# Patient Record
Sex: Female | Born: 1935 | Race: White | Hispanic: No | Smoking: Former smoker
Health system: Southern US, Community
[De-identification: ages and names within clinical notes are randomized; demographics above are authoritative.]

## PROBLEM LIST (undated history)

## (undated) DIAGNOSIS — IMO0002 Reserved for concepts with insufficient information to code with codable children: Secondary | ICD-10-CM

## (undated) DIAGNOSIS — L89309 Pressure ulcer of unspecified buttock, unspecified stage: Secondary | ICD-10-CM

## (undated) DIAGNOSIS — M839 Adult osteomalacia, unspecified: Secondary | ICD-10-CM

## (undated) DIAGNOSIS — J9620 Acute and chronic respiratory failure, unspecified whether with hypoxia or hypercapnia: Secondary | ICD-10-CM

## (undated) DIAGNOSIS — J90 Pleural effusion, not elsewhere classified: Secondary | ICD-10-CM

## (undated) DIAGNOSIS — I2510 Atherosclerotic heart disease of native coronary artery without angina pectoris: Secondary | ICD-10-CM

## (undated) DIAGNOSIS — R791 Abnormal coagulation profile: Secondary | ICD-10-CM

## (undated) DIAGNOSIS — R1312 Dysphagia, oropharyngeal phase: Secondary | ICD-10-CM

## (undated) DIAGNOSIS — J189 Pneumonia, unspecified organism: Secondary | ICD-10-CM

## (undated) DIAGNOSIS — E46 Unspecified protein-calorie malnutrition: Secondary | ICD-10-CM

## (undated) DIAGNOSIS — I96 Gangrene, not elsewhere classified: Secondary | ICD-10-CM

## (undated) DIAGNOSIS — I10 Essential (primary) hypertension: Secondary | ICD-10-CM

## (undated) DIAGNOSIS — M1990 Unspecified osteoarthritis, unspecified site: Secondary | ICD-10-CM

## (undated) DIAGNOSIS — I739 Peripheral vascular disease, unspecified: Secondary | ICD-10-CM

## (undated) DIAGNOSIS — E785 Hyperlipidemia, unspecified: Secondary | ICD-10-CM

## (undated) DIAGNOSIS — R040 Epistaxis: Secondary | ICD-10-CM

## (undated) DIAGNOSIS — Z8719 Personal history of other diseases of the digestive system: Secondary | ICD-10-CM

## (undated) DIAGNOSIS — M479 Spondylosis, unspecified: Secondary | ICD-10-CM

## (undated) DIAGNOSIS — J449 Chronic obstructive pulmonary disease, unspecified: Secondary | ICD-10-CM

## (undated) DIAGNOSIS — E876 Hypokalemia: Secondary | ICD-10-CM

## (undated) DIAGNOSIS — N390 Urinary tract infection, site not specified: Secondary | ICD-10-CM

## (undated) DIAGNOSIS — I70209 Unspecified atherosclerosis of native arteries of extremities, unspecified extremity: Secondary | ICD-10-CM

## (undated) DIAGNOSIS — K219 Gastro-esophageal reflux disease without esophagitis: Secondary | ICD-10-CM

## (undated) DIAGNOSIS — I4891 Unspecified atrial fibrillation: Secondary | ICD-10-CM

## (undated) DIAGNOSIS — Z950 Presence of cardiac pacemaker: Secondary | ICD-10-CM

## (undated) DIAGNOSIS — Z1612 Extended spectrum beta lactamase (ESBL) resistance: Secondary | ICD-10-CM

## (undated) DIAGNOSIS — I509 Heart failure, unspecified: Secondary | ICD-10-CM

## (undated) DIAGNOSIS — A499 Bacterial infection, unspecified: Secondary | ICD-10-CM

## (undated) HISTORY — DX: Adult osteomalacia, unspecified: M83.9

## (undated) HISTORY — DX: Peripheral vascular disease, unspecified: I73.9

## (undated) HISTORY — DX: Hypokalemia: E87.6

## (undated) HISTORY — DX: Essential (primary) hypertension: I10

## (undated) HISTORY — DX: Unspecified protein-calorie malnutrition: E46

## (undated) HISTORY — DX: Abnormal coagulation profile: R79.1

## (undated) HISTORY — PX: CATARACT EXTRACTION, BILATERAL: SHX1313

## (undated) HISTORY — DX: Urinary tract infection, site not specified: N39.0

## (undated) HISTORY — PX: CHOLECYSTECTOMY: SHX55

## (undated) HISTORY — DX: Acute and chronic respiratory failure, unspecified whether with hypoxia or hypercapnia: J96.20

## (undated) HISTORY — DX: Pleural effusion, not elsewhere classified: J90

## (undated) HISTORY — PX: ABDOMINAL HYSTERECTOMY: SHX81

## (undated) HISTORY — PX: APPENDECTOMY: SHX54

## (undated) HISTORY — DX: Pressure ulcer of unspecified buttock, unspecified stage: L89.309

## (undated) HISTORY — DX: Presence of cardiac pacemaker: Z95.0

## (undated) HISTORY — DX: Dysphagia, oropharyngeal phase: R13.12

---
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
## 2001-11-24 ENCOUNTER — Encounter: Payer: Self-pay | Admitting: Internal Medicine

## 2001-11-24 ENCOUNTER — Encounter: Admission: RE | Admit: 2001-11-24 | Discharge: 2001-11-24 | Payer: Self-pay | Admitting: Internal Medicine

## 2002-04-14 ENCOUNTER — Encounter: Admission: RE | Admit: 2002-04-14 | Discharge: 2002-04-14 | Payer: Self-pay | Admitting: Orthopedic Surgery

## 2002-04-14 ENCOUNTER — Encounter: Payer: Self-pay | Admitting: Orthopedic Surgery

## 2002-06-22 ENCOUNTER — Encounter: Payer: Self-pay | Admitting: Internal Medicine

## 2002-06-22 ENCOUNTER — Encounter: Admission: RE | Admit: 2002-06-22 | Discharge: 2002-06-22 | Payer: Self-pay | Admitting: Internal Medicine

## 2002-12-14 ENCOUNTER — Encounter: Admission: RE | Admit: 2002-12-14 | Discharge: 2002-12-14 | Payer: Self-pay | Admitting: Internal Medicine

## 2002-12-14 ENCOUNTER — Encounter: Payer: Self-pay | Admitting: Internal Medicine

## 2004-02-23 ENCOUNTER — Encounter: Admission: RE | Admit: 2004-02-23 | Discharge: 2004-02-23 | Payer: Self-pay | Admitting: Internal Medicine

## 2004-08-21 ENCOUNTER — Ambulatory Visit (HOSPITAL_COMMUNITY): Admission: RE | Admit: 2004-08-21 | Discharge: 2004-08-21 | Payer: Self-pay | Admitting: Cardiology

## 2004-12-04 ENCOUNTER — Emergency Department (HOSPITAL_COMMUNITY): Admission: EM | Admit: 2004-12-04 | Discharge: 2004-12-04 | Payer: Self-pay | Admitting: Emergency Medicine

## 2005-03-21 ENCOUNTER — Encounter: Admission: RE | Admit: 2005-03-21 | Discharge: 2005-03-21 | Payer: Self-pay | Admitting: Internal Medicine

## 2005-10-03 ENCOUNTER — Encounter: Admission: RE | Admit: 2005-10-03 | Discharge: 2005-10-03 | Payer: Self-pay | Admitting: Orthopedic Surgery

## 2005-12-01 ENCOUNTER — Encounter: Admission: RE | Admit: 2005-12-01 | Discharge: 2005-12-01 | Payer: Self-pay | Admitting: Internal Medicine

## 2006-03-23 ENCOUNTER — Encounter: Admission: RE | Admit: 2006-03-23 | Discharge: 2006-03-23 | Payer: Self-pay | Admitting: Internal Medicine

## 2007-02-27 ENCOUNTER — Encounter: Payer: Self-pay | Admitting: Internal Medicine

## 2007-03-02 DIAGNOSIS — R05 Cough: Secondary | ICD-10-CM

## 2007-03-03 ENCOUNTER — Telehealth: Payer: Self-pay | Admitting: Internal Medicine

## 2007-03-03 ENCOUNTER — Telehealth (INDEPENDENT_AMBULATORY_CARE_PROVIDER_SITE_OTHER): Payer: Self-pay | Admitting: *Deleted

## 2007-03-03 ENCOUNTER — Ambulatory Visit: Payer: Self-pay | Admitting: Internal Medicine

## 2007-03-03 DIAGNOSIS — J449 Chronic obstructive pulmonary disease, unspecified: Secondary | ICD-10-CM | POA: Insufficient documentation

## 2007-03-03 DIAGNOSIS — I10 Essential (primary) hypertension: Secondary | ICD-10-CM | POA: Insufficient documentation

## 2007-03-03 DIAGNOSIS — I4891 Unspecified atrial fibrillation: Secondary | ICD-10-CM

## 2007-03-05 ENCOUNTER — Telehealth: Payer: Self-pay | Admitting: Internal Medicine

## 2007-03-24 ENCOUNTER — Telehealth: Payer: Self-pay | Admitting: Internal Medicine

## 2007-03-29 ENCOUNTER — Encounter: Payer: Self-pay | Admitting: Internal Medicine

## 2007-04-07 ENCOUNTER — Encounter: Admission: RE | Admit: 2007-04-07 | Discharge: 2007-04-07 | Payer: Self-pay | Admitting: Family Medicine

## 2007-04-14 ENCOUNTER — Telehealth: Payer: Self-pay | Admitting: Internal Medicine

## 2007-07-29 ENCOUNTER — Ambulatory Visit: Payer: Self-pay | Admitting: *Deleted

## 2007-07-29 ENCOUNTER — Ambulatory Visit: Payer: Self-pay | Admitting: Internal Medicine

## 2007-07-29 ENCOUNTER — Inpatient Hospital Stay (HOSPITAL_COMMUNITY): Admission: EM | Admit: 2007-07-29 | Discharge: 2007-08-09 | Payer: Self-pay | Admitting: Emergency Medicine

## 2007-07-30 ENCOUNTER — Encounter: Payer: Self-pay | Admitting: Internal Medicine

## 2007-07-30 ENCOUNTER — Encounter (INDEPENDENT_AMBULATORY_CARE_PROVIDER_SITE_OTHER): Payer: Self-pay | Admitting: Internal Medicine

## 2007-08-05 ENCOUNTER — Encounter: Payer: Self-pay | Admitting: Pulmonary Disease

## 2007-08-05 HISTORY — PX: PERMANENT PACEMAKER INSERTION: SHX6023

## 2007-08-06 ENCOUNTER — Ambulatory Visit: Payer: Self-pay | Admitting: Physical Medicine & Rehabilitation

## 2007-08-09 ENCOUNTER — Inpatient Hospital Stay (HOSPITAL_COMMUNITY)
Admission: RE | Admit: 2007-08-09 | Discharge: 2007-08-19 | Payer: Self-pay | Admitting: Physical Medicine & Rehabilitation

## 2007-08-19 ENCOUNTER — Telehealth (INDEPENDENT_AMBULATORY_CARE_PROVIDER_SITE_OTHER): Payer: Self-pay | Admitting: *Deleted

## 2007-08-24 ENCOUNTER — Ambulatory Visit: Payer: Self-pay | Admitting: Internal Medicine

## 2007-08-24 ENCOUNTER — Telehealth: Payer: Self-pay | Admitting: Internal Medicine

## 2007-09-13 ENCOUNTER — Telehealth (INDEPENDENT_AMBULATORY_CARE_PROVIDER_SITE_OTHER): Payer: Self-pay | Admitting: *Deleted

## 2007-11-10 ENCOUNTER — Ambulatory Visit (HOSPITAL_COMMUNITY)
Admission: RE | Admit: 2007-11-10 | Discharge: 2007-11-10 | Payer: Self-pay | Admitting: Physical Medicine & Rehabilitation

## 2007-12-20 ENCOUNTER — Telehealth (INDEPENDENT_AMBULATORY_CARE_PROVIDER_SITE_OTHER): Payer: Self-pay | Admitting: *Deleted

## 2008-03-15 ENCOUNTER — Ambulatory Visit: Payer: Self-pay | Admitting: Vascular Surgery

## 2008-03-26 ENCOUNTER — Encounter: Payer: Self-pay | Admitting: Internal Medicine

## 2008-04-27 ENCOUNTER — Ambulatory Visit: Payer: Self-pay | Admitting: Internal Medicine

## 2008-04-28 LAB — CONVERTED CEMR LAB: INR: 2 — ABNORMAL HIGH (ref 0.8–1.0)

## 2008-05-12 ENCOUNTER — Telehealth: Payer: Self-pay | Admitting: Internal Medicine

## 2008-05-29 ENCOUNTER — Inpatient Hospital Stay (HOSPITAL_COMMUNITY): Admission: EM | Admit: 2008-05-29 | Discharge: 2008-06-02 | Payer: Self-pay | Admitting: Emergency Medicine

## 2008-06-01 ENCOUNTER — Encounter (INDEPENDENT_AMBULATORY_CARE_PROVIDER_SITE_OTHER): Payer: Self-pay | Admitting: *Deleted

## 2008-06-05 ENCOUNTER — Telehealth (INDEPENDENT_AMBULATORY_CARE_PROVIDER_SITE_OTHER): Payer: Self-pay | Admitting: *Deleted

## 2008-06-11 ENCOUNTER — Ambulatory Visit: Payer: Self-pay | Admitting: Cardiology

## 2008-06-12 ENCOUNTER — Inpatient Hospital Stay (HOSPITAL_COMMUNITY): Admission: EM | Admit: 2008-06-12 | Discharge: 2008-06-17 | Payer: Self-pay | Admitting: Emergency Medicine

## 2008-06-23 ENCOUNTER — Emergency Department (HOSPITAL_COMMUNITY): Admission: EM | Admit: 2008-06-23 | Discharge: 2008-06-23 | Payer: Self-pay | Admitting: Emergency Medicine

## 2008-06-24 ENCOUNTER — Inpatient Hospital Stay (HOSPITAL_COMMUNITY): Admission: EM | Admit: 2008-06-24 | Discharge: 2008-06-28 | Payer: Self-pay | Admitting: Emergency Medicine

## 2008-06-24 ENCOUNTER — Ambulatory Visit: Payer: Self-pay | Admitting: Internal Medicine

## 2008-06-27 ENCOUNTER — Telehealth: Payer: Self-pay | Admitting: Internal Medicine

## 2008-09-01 ENCOUNTER — Encounter: Admission: RE | Admit: 2008-09-01 | Discharge: 2008-09-01 | Payer: Self-pay | Admitting: Family Medicine

## 2008-11-01 ENCOUNTER — Encounter: Payer: Self-pay | Admitting: Internal Medicine

## 2009-02-06 LAB — PROTIME-INR

## 2009-08-23 ENCOUNTER — Telehealth (INDEPENDENT_AMBULATORY_CARE_PROVIDER_SITE_OTHER): Payer: Self-pay | Admitting: *Deleted

## 2009-09-20 ENCOUNTER — Encounter: Admission: RE | Admit: 2009-09-20 | Discharge: 2009-09-20 | Payer: Self-pay | Admitting: Family Medicine

## 2010-03-17 ENCOUNTER — Encounter: Payer: Self-pay | Admitting: Family Medicine

## 2010-03-26 NOTE — Progress Notes (Signed)
Summary: Spiriva RX  Phone Note Call from Patient Call back at [phone] ex [phone]   Caller: Patient Call For: wert Summary of Call: need 90 day supply prescript for spiriva sent to [facility] Initial call taken by: Rickard Patience,  August 23, 2009 1:52 PM  Follow-up for Phone Call        Dr. Sherene Sires, you wanted the pt to followup here only as needed, is it okay to send a refill to [facility] for at least a 90 day supply? Pls advise thanks! Follow-up by: Vernie Murders,  August 23, 2009 2:08 PM  Additional Follow-up for Phone Call Additional follow up Details #1::        not seen > 1 year so option is to return for check up (Tammy could do this) or have primary complete the rx (either way make sure she doesn't run out of rx while waiting for her shipment!) Additional Follow-up by: Nyoka Cowden MD,  August 23, 2009 2:52 PM    Additional Follow-up for Phone Call Additional follow up Details #2::    Spoke with the [t's daughter, Steward Drone, and she is going to see if pt's cardiologist will fill this medication since she has an upcoming appt. She will call if needing an appt with MW or TP. Per Steward Drone, the pt just started on her last 90 day supply of Spiriva and has plenty for now. Follow-up by: Michel Bickers CMA,  August 23, 2009 3:15 PM

## 2010-03-29 LAB — PROTIME-INR

## 2010-06-04 LAB — CBC
HCT: 28.3 % — ABNORMAL LOW (ref 36.0–46.0)
HCT: 28.7 % — ABNORMAL LOW (ref 36.0–46.0)
Hemoglobin: 9.5 g/dL — ABNORMAL LOW (ref 12.0–15.0)
MCHC: 33.3 g/dL (ref 30.0–36.0)
MCHC: 33.4 g/dL (ref 30.0–36.0)
MCV: 92.5 fL (ref 78.0–100.0)
Platelets: 197 10*3/uL (ref 150–400)
Platelets: 208 10*3/uL (ref 150–400)
Platelets: 211 10*3/uL (ref 150–400)
RBC: 2.98 MIL/uL — ABNORMAL LOW (ref 3.87–5.11)
RBC: 3.1 MIL/uL — ABNORMAL LOW (ref 3.87–5.11)
RDW: 14.3 % (ref 11.5–15.5)
RDW: 14.7 % (ref 11.5–15.5)
RDW: 14.9 % (ref 11.5–15.5)
WBC: 5.3 10*3/uL (ref 4.0–10.5)
WBC: 6.1 10*3/uL (ref 4.0–10.5)
WBC: 6.2 10*3/uL (ref 4.0–10.5)

## 2010-06-04 LAB — COMPREHENSIVE METABOLIC PANEL
ALT: 13 U/L (ref 0–35)
AST: 18 U/L (ref 0–37)
CO2: 28 mEq/L (ref 19–32)
Chloride: 105 mEq/L (ref 96–112)
GFR calc Af Amer: >60 mL/min (ref >60)
GFR calc non Af Amer: >60 mL/min (ref >60)
Sodium: 141 mEq/L (ref 135–145)
Total Bilirubin: 0.3 mg/dL (ref 0.3–1.2)

## 2010-06-04 LAB — BASIC METABOLIC PANEL
BUN: 2 mg/dL — ABNORMAL LOW (ref 6–23)
BUN: 3 mg/dL — ABNORMAL LOW (ref 6–23)
BUN: 4 mg/dL — ABNORMAL LOW (ref 6–23)
CO2: 27 mEq/L (ref 19–32)
CO2: 28 mEq/L (ref 19–32)
CO2: 29 mEq/L (ref 19–32)
Calcium: 8.4 mg/dL (ref 8.4–10.5)
Calcium: 8.7 mg/dL (ref 8.4–10.5)
Chloride: 108 mEq/L (ref 96–112)
Creatinine, Ser: 0.6 mg/dL (ref 0.4–1.2)
Creatinine, Ser: 0.77 mg/dL (ref 0.4–1.2)
GFR calc Af Amer: >60 mL/min (ref >60)
GFR calc Af Amer: >60 mL/min (ref >60)
GFR calc non Af Amer: >60 mL/min (ref >60)
GFR calc non Af Amer: >60 mL/min (ref >60)
Glucose, Bld: 111 mg/dL — ABNORMAL HIGH (ref 70–99)
Glucose, Bld: 112 mg/dL — ABNORMAL HIGH (ref 70–99)
Glucose, Bld: 115 mg/dL — ABNORMAL HIGH (ref 70–99)
Potassium: 4 mEq/L (ref 3.5–5.1)
Potassium: 4.4 mEq/L (ref 3.5–5.1)
Sodium: 141 mEq/L (ref 135–145)

## 2010-06-04 LAB — PROTIME-INR
INR: 1.8 — ABNORMAL HIGH (ref 0.00–1.49)
INR: 2.1 — ABNORMAL HIGH (ref 0.00–1.49)
Prothrombin Time: 21.8 seconds — ABNORMAL HIGH (ref 11.6–15.2)
Prothrombin Time: 23.9 seconds — ABNORMAL HIGH (ref 11.6–15.2)
Prothrombin Time: 25.3 seconds — ABNORMAL HIGH (ref 11.6–15.2)

## 2010-06-04 LAB — URINE CULTURE: Colony Count: 60000

## 2010-06-04 LAB — URINALYSIS, ROUTINE W REFLEX MICROSCOPIC
Bilirubin Urine: NEGATIVE
Nitrite: NEGATIVE
Specific Gravity, Urine: 1.019 (ref 1.005–1.030)
Urobilinogen, UA: 0.2 mg/dL (ref 0.0–1.0)

## 2010-06-04 LAB — CULTURE, BLOOD (ROUTINE X 2): Culture: NO GROWTH

## 2010-06-04 LAB — DIFFERENTIAL
Basophils Absolute: 0 10*3/uL (ref 0.0–0.1)
Eosinophils Absolute: 0.1 10*3/uL (ref 0.0–0.7)
Eosinophils Relative: 1 % (ref 0–5)
Lymphs Abs: 1.3 10*3/uL (ref 0.7–4.0)

## 2010-06-05 LAB — CBC
HCT: 32.6 % — ABNORMAL LOW (ref 36.0–46.0)
HCT: 34.2 % — ABNORMAL LOW (ref 36.0–46.0)
HCT: 34.3 % — ABNORMAL LOW (ref 36.0–46.0)
Hemoglobin: 9.8 g/dL — ABNORMAL LOW (ref 12.0–15.0)
Hemoglobin: 11.3 g/dL — ABNORMAL LOW (ref 12.0–15.0)
Hemoglobin: 11.6 g/dL — ABNORMAL LOW (ref 12.0–15.0)
MCHC: 34 g/dL (ref 30.0–36.0)
MCHC: 34.1 g/dL (ref 30.0–36.0)
MCHC: 34.5 g/dL (ref 30.0–36.0)
MCHC: 34.7 g/dL (ref 30.0–36.0)
MCHC: 33.7 g/dL (ref 30.0–36.0)
MCHC: 33.6 g/dL (ref 30.0–36.0)
MCHC: 33.6 g/dL (ref 30.0–36.0)
MCHC: 33.9 g/dL (ref 30.0–36.0)
MCV: 91.5 fL (ref 78.0–100.0)
MCV: 91.9 fL (ref 78.0–100.0)
MCV: 90.8 fL (ref 78.0–100.0)
MCV: 91.4 fL (ref 78.0–100.0)
MCV: 91.5 fL (ref 78.0–100.0)
Platelets: 172 10*3/uL (ref 150–400)
Platelets: 184 10*3/uL (ref 150–400)
Platelets: 194 10*3/uL (ref 150–400)
Platelets: 218 10*3/uL (ref 150–400)
Platelets: 197 10*3/uL (ref 150–400)
Platelets: 210 10*3/uL (ref 150–400)
Platelets: 211 10*3/uL (ref 150–400)
Platelets: 232 10*3/uL (ref 150–400)
RBC: 3.18 MIL/uL — ABNORMAL LOW (ref 3.87–5.11)
RBC: 3.21 MIL/uL — ABNORMAL LOW (ref 3.87–5.11)
RBC: 3.62 MIL/uL — ABNORMAL LOW (ref 3.87–5.11)
RBC: 3.98 MIL/uL (ref 3.87–5.11)
RBC: 3.57 MIL/uL — ABNORMAL LOW (ref 3.87–5.11)
RDW: 14.8 % (ref 11.5–15.5)
RDW: 15.1 % (ref 11.5–15.5)
RDW: 14.2 % (ref 11.5–15.5)
RDW: 13.9 % (ref 11.5–15.5)
RDW: 14.1 % (ref 11.5–15.5)
WBC: 14 10*3/uL — ABNORMAL HIGH (ref 4.0–10.5)
WBC: 6.4 10*3/uL (ref 4.0–10.5)
WBC: 6.6 10*3/uL (ref 4.0–10.5)
WBC: 7.2 10*3/uL (ref 4.0–10.5)
WBC: 10.4 10*3/uL (ref 4.0–10.5)
WBC: 11.2 10*3/uL — ABNORMAL HIGH (ref 4.0–10.5)
WBC: 17.3 10*3/uL — ABNORMAL HIGH (ref 4.0–10.5)

## 2010-06-05 LAB — URINALYSIS, ROUTINE W REFLEX MICROSCOPIC
Bilirubin Urine: NEGATIVE
Bilirubin Urine: NEGATIVE
Bilirubin Urine: NEGATIVE
Glucose, UA: NEGATIVE mg/dL
Hgb urine dipstick: NEGATIVE
Hgb urine dipstick: NEGATIVE
Ketones, ur: NEGATIVE mg/dL
Nitrite: NEGATIVE
Nitrite: POSITIVE — AB
Protein, ur: NEGATIVE mg/dL
Protein, ur: NEGATIVE mg/dL
Specific Gravity, Urine: 1.016 (ref 1.005–1.030)
Urobilinogen, UA: 0.2 mg/dL (ref 0.0–1.0)
Urobilinogen, UA: 0.2 mg/dL (ref 0.0–1.0)
pH: 7.5 (ref 5.0–8.0)

## 2010-06-05 LAB — COMPREHENSIVE METABOLIC PANEL
ALT: 17 U/L (ref 0–35)
ALT: 21 U/L (ref 0–35)
AST: 19 U/L (ref 0–37)
AST: 27 U/L (ref 0–37)
Albumin: 2.9 g/dL — ABNORMAL LOW (ref 3.5–5.2)
Albumin: 3.4 g/dL — ABNORMAL LOW (ref 3.5–5.2)
Alkaline Phosphatase: 43 U/L (ref 39–117)
Alkaline Phosphatase: 44 U/L (ref 39–117)
BUN: 11 mg/dL (ref 6–23)
BUN: 11 mg/dL (ref 6–23)
CO2: 29 mEq/L (ref 19–32)
CO2: 30 mEq/L (ref 19–32)
CO2: 31 mEq/L (ref 19–32)
Calcium: 8.2 mg/dL — ABNORMAL LOW (ref 8.4–10.5)
Calcium: 9.1 mg/dL (ref 8.4–10.5)
Chloride: 94 mEq/L — ABNORMAL LOW (ref 96–112)
Creatinine, Ser: 0.86 mg/dL (ref 0.4–1.2)
Creatinine, Ser: 0.89 mg/dL (ref 0.4–1.2)
GFR calc Af Amer: >60 mL/min (ref >60)
GFR calc Af Amer: >60 mL/min (ref >60)
GFR calc non Af Amer: >60 mL/min (ref >60)
GFR calc non Af Amer: >60 mL/min (ref >60)
Glucose, Bld: 134 mg/dL — ABNORMAL HIGH (ref 70–99)
Potassium: 3.7 mEq/L (ref 3.5–5.1)
Sodium: 132 mEq/L — ABNORMAL LOW (ref 135–145)
Sodium: 135 mEq/L (ref 135–145)
Total Bilirubin: 0.5 mg/dL (ref 0.3–1.2)
Total Protein: 6.2 g/dL (ref 6.0–8.3)

## 2010-06-05 LAB — BASIC METABOLIC PANEL
BUN: 14 mg/dL (ref 6–23)
CO2: 32 mEq/L (ref 19–32)
CO2: 29 mEq/L (ref 19–32)
CO2: 30 mEq/L (ref 19–32)
Calcium: 8.3 mg/dL — ABNORMAL LOW (ref 8.4–10.5)
Calcium: 8.8 mg/dL (ref 8.4–10.5)
Chloride: 102 mEq/L (ref 96–112)
Creatinine, Ser: 0.76 mg/dL (ref 0.4–1.2)
Creatinine, Ser: 0.77 mg/dL (ref 0.4–1.2)
GFR calc Af Amer: >60 mL/min (ref >60)
GFR calc non Af Amer: >60 mL/min (ref >60)
Glucose, Bld: 121 mg/dL — ABNORMAL HIGH (ref 70–99)
Glucose, Bld: 120 mg/dL — ABNORMAL HIGH (ref 70–99)
Potassium: 3.4 mEq/L — ABNORMAL LOW (ref 3.5–5.1)
Sodium: 135 mEq/L (ref 135–145)

## 2010-06-05 LAB — LIPID PANEL
Cholesterol: 119 mg/dL (ref 0–200)
HDL: 36 mg/dL — ABNORMAL LOW (ref >39)
Triglycerides: 58 mg/dL (ref <150)

## 2010-06-05 LAB — DIFFERENTIAL
Basophils Absolute: 0.3 10*3/uL — ABNORMAL HIGH (ref 0.0–0.1)
Basophils Relative: 0 % (ref 0–1)
Basophils Relative: 0 % (ref 0–1)
Basophils Relative: 2 % — ABNORMAL HIGH (ref 0–1)
Eosinophils Absolute: 0 10*3/uL (ref 0.0–0.7)
Eosinophils Absolute: 0 10*3/uL (ref 0.0–0.7)
Eosinophils Absolute: 0 10*3/uL (ref 0.0–0.7)
Eosinophils Relative: 0 % (ref 0–5)
Lymphocytes Relative: 7 % — ABNORMAL LOW (ref 12–46)
Lymphs Abs: 0.6 10*3/uL — ABNORMAL LOW (ref 0.7–4.0)
Lymphs Abs: 1.3 10*3/uL (ref 0.7–4.0)
Monocytes Absolute: 0.8 10*3/uL (ref 0.1–1.0)
Monocytes Absolute: 1 10*3/uL (ref 0.1–1.0)
Monocytes Relative: 6 % (ref 3–12)
Monocytes Relative: 6 % (ref 3–12)
Neutro Abs: 12.9 10*3/uL — ABNORMAL HIGH (ref 1.7–7.7)
Neutro Abs: 6.1 10*3/uL (ref 1.7–7.7)
Neutro Abs: 14 10*3/uL — ABNORMAL HIGH (ref 1.7–7.7)
Neutrophils Relative %: 84 % — ABNORMAL HIGH (ref 43–77)
Neutrophils Relative %: 92 % — ABNORMAL HIGH (ref 43–77)

## 2010-06-05 LAB — URINE CULTURE: Colony Count: >=100000

## 2010-06-05 LAB — CULTURE, BLOOD (ROUTINE X 2): Culture: NO GROWTH

## 2010-06-05 LAB — POCT I-STAT, CHEM 8
BUN: 17 mg/dL (ref 6–23)
Calcium, Ion: 0.94 mmol/L — ABNORMAL LOW (ref 1.12–1.32)
Chloride: 97 mEq/L (ref 96–112)
HCT: 38 % (ref 36.0–46.0)
Sodium: 130 mEq/L — ABNORMAL LOW (ref 135–145)

## 2010-06-05 LAB — PROTIME-INR
INR: 1.7 — ABNORMAL HIGH (ref 0.00–1.49)
INR: 1.9 — ABNORMAL HIGH (ref 0.00–1.49)
INR: 2.1 — ABNORMAL HIGH (ref 0.00–1.49)
INR: 2.8 — ABNORMAL HIGH (ref 0.00–1.49)
INR: 1.7 — ABNORMAL HIGH (ref 0.00–1.49)
INR: 2.2 — ABNORMAL HIGH (ref 0.00–1.49)
INR: 2.8 — ABNORMAL HIGH (ref 0.00–1.49)
Prothrombin Time: 22.9 seconds — ABNORMAL HIGH (ref 11.6–15.2)
Prothrombin Time: 24.8 seconds — ABNORMAL HIGH (ref 11.6–15.2)
Prothrombin Time: 27.2 seconds — ABNORMAL HIGH (ref 11.6–15.2)
Prothrombin Time: 20.3 seconds — ABNORMAL HIGH (ref 11.6–15.2)
Prothrombin Time: 24.1 seconds — ABNORMAL HIGH (ref 11.6–15.2)

## 2010-06-05 LAB — APTT
aPTT: 34 seconds (ref 24–37)
aPTT: 31 seconds (ref 24–37)
aPTT: 35 seconds (ref 24–37)

## 2010-06-05 LAB — URINE MICROSCOPIC-ADD ON

## 2010-06-05 LAB — DIGOXIN LEVEL: Digoxin Level: <0.2 ng/mL — ABNORMAL LOW (ref 0.8–2.0)

## 2010-06-05 LAB — POCT CARDIAC MARKERS: Troponin i, poc: <0.05 ng/mL (ref 0.00–0.09)

## 2010-07-09 NOTE — Consult Note (Signed)
Peggy Harvey, Peggy Harvey              ACCOUNT NO.:  [id number]   MEDICAL RECORD NO.:  [id number]          PATIENT TYPE:  INP   LOCATION:  1418                         FACILITY:  [facility]   PHYSICIAN:  Casimiro Needle B. Sherene Sires, MD, FCCPDATE OF BIRTH:  03-10-15   DATE OF CONSULTATION:  07/30/2007  DATE OF DISCHARGE:                                 CONSULTATION   CHIEF COMPLAINT:  Cough and dyspnea.   HISTORY:  This is a 77 year old white female who has seen Dr. Maple Hudson  before with a diagnosis of severe COPD status post smoking cessation in  1978 with chronic nebulizer dependency and dyspnea with anything more  than slow ADLs.  She was in her usual state of health until June 1 when  she tried to swallow a potassium pill and became choked on it.  Since  then she has had increasing dyspnea with a congested cough, profound  fatigue with subjective fevers, chills, sweats and nausea.  She was  admitted with these symptoms and underwent chest CT scan that suggested  possible retention of secretions versus a foreign body in the left-sided  airways, and we were asked to see with a question whether not  bronchoscopy would be of any benefit here.   On my arrival, the patient had mild dyspnea at rest on oxygen at 4  liters, twice her normal regimen.  She did have a rattling cough. She  denies any chest pain and has not had any fever.  So far she has not  responded to her usual inhalers which include Spiriva and inhaled  steroids.  She denies any pleuritic pain and says she is swallowing fine  now.   PAST MEDICAL HISTORY:  1. Severe COPD.  2. Atrial fibrillation.  3. Hypertension.  4. Status post cholecystectomy, hysterectomy, and appendectomy.   FAMILY HISTORY:  Positive for heart disease, breast cancer in her sister  and lung cancer in her brother.   SOCIAL HISTORY:  She quit smoking in 1978, is widowed.  She lives with  her daughter.   MEDICATIONS AT PRESENT:  Include aspirin, Estrace, Flovent,  Citracal, K-  Dur, Lanoxin, Lasix, Lovenox, Ocuvite, Os-Cal, Protonix, Reglan,  Spiriva, Xopenex, Afrin, ibuprofen p.r.n., Maalox p.r.n. and Robitussin  p.r.n. at home.  However, according to our [place] record, she takes  Asmanex, Spiriva, Zantac, Reglan, Catapres, Estrace, ipratropium per  nebulizer, oxygen at 3 liters, a baby aspirin daily, and digoxin daily,  hydrochlorothiazide daily.   ALLERGIES:  SULFA, PENICILLIN.   REVIEW OF SYSTEMS:  Taken in detail and negative except as outlined  above.   PHYSICAL EXAMINATION:  This is a frail elderly white female who can  speak in full sentences, but does have somewhat of a congested rattling  cough.  HEENT is unremarkable.  Oropharynx clear.  NECK:  Supple without cervical adenopathy or tenderness.  Trachea is  midline.  Lung fields reveal pan-expiratory wheezing, no worse on the left than on  the right.  HEART:  Slightly irregular rhythm without murmur, gallop, or rub.  1/6  systolic murmur.  ABDOMEN:  Soft, benign.  EXTREMITIES:  Warm without  calf tenderness, cyanosis, clubbing, or  edema.       Chest CT scan does suggest a small density in the left lower lobe  bronchus.   Her admission chest x-ray did not show any focal atelectasis but rather  hyperinflated lungs bilaterally with mild to moderate cardiomegaly as  well.   LAB DATA:  She had BNP of 182 and a white count of 11,000.   IMPRESSION:  She very well may have aspirated her potassium pill or at  least of fragment of it.  However, presently she has evidence of diffuse  airways inflammation with pan-expiratory wheezing bilaterally and is  really a poor candidate for bronchoscopy unless it becomes an emergency.  I think the likelihood that we can help her with bronchoscopy at this  point is very low, and it most likely at this point, 5 days after the  aspiration event, she has a lot of secondary inflammation from having  both retained secretions and foreign bodies in  her airways.   I therefore recommended intense treatment for COPD exacerbation with  around-the-clock bronchodilators and  IV steroids and followup chest x-  ray.  If there is evidence of focal atelectatic changes, then certainly  bronchoscopy should be reconsidered.  If her condition worsens I think  she would require intubation and perhaps even rigid bronchoscopy to  remove any large foreign bodies via the airway.  In the meantime, the  best we can hope for is that improving her airway mechanics and cough  mechanics, she will be able to cough any retained secretions or pills up  on her own.      [name]. Sherene Sires, MD, [facility]  Electronically Signed     MBW/MEDQ  D:  07/30/2007  T:  07/30/2007  Job:  [id number]   cc:   Pam Drown, M.D.  Fax: [phone]

## 2010-07-09 NOTE — Discharge Summary (Signed)
Peggy Harvey, IANNELLO NO.:  [id number]   MEDICAL RECORD NO.:  [id number]          PATIENT TYPE:  IPS   LOCATION:  4034                         FACILITY:  [facility]   PHYSICIAN:  Ellwood Dense, M.D.   DATE OF BIRTH:  Aug 27, 1913   DATE OF ADMISSION:  08/09/2007  DATE OF DISCHARGE:                               DISCHARGE SUMMARY   PRIMARY CARE PHYSICIAN:  Pam Drown, MD   PULMONOLOGIST:  Rennis Chris. Maple Hudson, MD, [place], FACP   CARDIOLOGIST:  Rod Holler, MD   HISTORY OF PRESENT ILLNESS:  Peggy Harvey is a 77 year old Caucasian  female with history of COPD.  The patient was admitted on July 29, 2007,  with off and on nausea and vomiting along with shortness of breath and  failure to thrive.  Chest x-ray showed COPD without acute changes.  EKG  showed atrial fibrillation.  Chest CT showed questionable retention of  secretions, but no need for bronchoscopy.  Intense treatment for  treatment of COPD included use of IV steroids with followup with  Pulmonary Services.  The patient was seen by Dr. Samuel Bouche for persistent  bradycardia with heart rate of 50 beats per minute.  She also had 2  periods of asystole.  A permanent pacemaker was placed on August 05, 2007,  by Dr. Amil Amen.  Subcutaneous heparin was added for DVT prophylaxis.  She has had bouts of constipation with KUB on August 08, 2007, showing  retained feces with MiraLax and Colace recently added.   The patient was evaluated by the rehabilitation physicians and felt to  be an appropriate candidate for inpatient rehabilitation.   REVIEW OF SYSTEMS:  Positive for shortness of breath and weakness.   PAST MEDICAL HISTORY:  1. COPD.  2. Paroxysmal atrial fibrillation.  3. Hypertension.  4. Prior cholecystectomy.  5. Prior hysterectomy.  6. Prior appendectomy.   FAMILY HISTORY:  Noncontributory.   SOCIAL HISTORY:  The patient lives with a daughter and local family who  can assist as needed.  She does not use  alcohol and reports remote  tobacco usage.   FUNCTIONAL HISTORY PRIOR TO ADMISSION:  The patient used a walker prior  to admission.   ALLERGIES:  PENICILLIN and SULFA.   MEDICATIONS PRIOR TO ADMISSION:  1. Asmanex daily.  2. Xopenex b.i.d.  3. Spiriva daily.  4. Zantac daily.  5. Reglan b.i.d.  6. Ocuvite daily.  7. Digoxin one-half tablet daily.  8. Hydrochlorothiazide 25 mg daily.  9. Estrace 1 mg daily.  10.Catapres-TTS #2 patch changed weekly.  11.Aspirin 81 mg daily.  12.A 3 L of O2 by nasal cannula.  13.Vitamin D daily.  14.Omega-3 fatty acids daily.   LABORATORY:  Recent hemoglobin was 11.9 with hematocrit of 34.5,  platelet count of 215,000, and white count 11.9.  Recent sodium is 138,  potassium 4.8, chloride 100, bicarbonate 31, BUN 12, and creatinine 1.0.   PHYSICAL EXAMINATION:  GENERAL:  Reasonably well-appearing elderly frail-  appearing adult female lying in bed, in no acute discomfort.  VITAL SIGNS:  Blood pressure 122/62 with a pulse of 64, respiratory  rate  of 16, and temperature 97.0.  HEENT:  Normocephalic and nontraumatic.  CARDIOVASCULAR:  Irregular rate and rhythm.  S1 and S2 without murmurs.  ABDOMEN:  Soft and nontender with positive bowel sounds.  LUNGS:  Clear to auscultation bilaterally with a pacemaker present  without drainage.  NEUROLOGIC:  Alert and oriented x3.  Cranial nerves II-XII are intact.  Bilateral upper extremity exam showed 4-/5 strength throughout.  Bulk  and tone were normal.  Reflexes 2+ and symmetrical.  Sensation was  intact to light touch throughout the bilateral upper extremities.  Lower  extremity exam showed 4-/5 strength throughout with decreased bulk and  normal tone.   IMPRESSION:  Severe deconditioning, status post permanent pacemaker on  August 05, 2007, with severe chronic obstructive pulmonary disease on O2,  steroids, and multiple nebulizers.   Presently, the patient has deficits in ADLs, transfers and  ambulation  related to the above-noted permanent pacemaker placement and severe  chronic obstructive pulmonary disease.   PLAN:  1. Admit to rehabilitation unit for daily therapies to include      physical therapy for range of motion, strengthening, bed mobility,      transfers, pre gait training, gait training and equipment eval.  2. Occupational therapy for range of motion, strengthening, ADLs,      cognitive/perceptual training, splinting, and equipment eval.  3. Rehab nursing for skin care, wound care, and bowel and bladder      training as necessary.  4. Case management to assess home environment, assist with discharge      planning and arrange for appropriate followup care.  5. Social work to assess family and social support, and assist in      discharge planning.  6. Continue regular diet.  7. Check admission labs including CBC and CMET in a.m. on August 10, 2007.  8. Vicodin 5/500, 1 tablet q.4 hours p.r.n. for pain.  9. Incentive spirometry q.2 hours while awake.  10.A 3 L of O2 by nasal cannula to keep saturation greater than 90%.  11.Handheld albuterol nebulizer 2.5 mg q.6 hours.  12.Norvasc 5 mg p.o. daily.  13.Aspirin 325 mg p.o. daily.  14.Pulmicort 0.5 mg q.12.  15.Os-Cal 1 p.o. b.i.d.  16.Catapres patch #2 change every weeks.  17.Lanoxin 0.125 mg p.o. daily.  18.Cardizem CD 240 mg p.o. daily.  19.Senokot-S 2 tablets p.o. nightly.  20.Estrace 1 mg p.o. daily.  21.Subcutaneous heparin 5000 units q.8.  22.Atrovent 0.5 mg q.6 hours.  23.Singulair 10 mg p.o. nightly.  24.Protonix 40 mg p.o. b.i.d.  25.MiraLax 17 g p.o. daily with 8 ounces of water.  26.Prednisone 30 mg p.o. b.i.d.  27.Routine turning to prevent skin breakdown.  28.Pacemaker precautions with the arm allowed up to the ear height x2      days, then up to the top of the head x2 days, and then over across      the other ear after the ninth day.   PROGNOSIS:  Good.   ESTIMATED LENGTH OF STAY:   5-10 days.   GOALS:  Modified independent, ADLs, transfers, and ambulation.           ______________________________  Ellwood Dense, M.D.     DC/MEDQ  D:  08/09/2007  T:  08/10/2007  Job:  [id number]   cc:   Pam Drown, M.D.  Clinton D. Maple Hudson, MD, FCCP, FACP  Rod Holler, MD

## 2010-07-09 NOTE — Discharge Summary (Signed)
NAMELEVETTE, PAULICK              ACCOUNT NO.:  [id number]   MEDICAL RECORD NO.:  [id number]          PATIENT TYPE:  IPS   LOCATION:  4034                         FACILITY:  [facility]   PHYSICIAN:  Michiel Cowboy, MDDATE OF BIRTH:  01-14-1914   DATE OF ADMISSION:  08/09/2007  DATE OF DISCHARGE:  08/09/2007                               DISCHARGE SUMMARY   CONSULTANTS:  Dr. Eldridge Dace in rehab medicine.  Dr. Lowell Guitar, Pulmonary.   DISCHARGE DIAGNOSES:  1. Chronic obstructive pulmonary disease exacerbation.  2. Symptomatic bradycardia requiring pacemaker during hospitalization.  3. Constipation.  4. Paroxysmal atrial fibrillation, not a Coumadin candidate.  5. Degenerative disk disease.  6. Cipro secondary thrush.  7. Gastroesophageal reflux disease.  8. Osteomalacia.  9. Hypertension.  10.History of systolic dysfunction.   STUDIES:  The patient had a CT angiogram done on June 5, which showed  extensive bronchial filling defects involving the left lower lobe and  the left main stem bronchus related to secretions and mucus plugging,  possible aspiration, no evidence of PE.  Chest x-ray done on June 8.  On  June 8 swallowing function study, which was significant for some  aspiration.  The patient to be on dysphagia diet.  Chest x-ray done on  June 11 and June 12 with last chest x-ray on June 12 showing left single  lead placement.  No pneumothorax.  No change otherwise.  Abdomen x-ray  done June 14 showing prominent stool throughout the colon.   HOSPITAL COURSE:  1. The patient was originally admitted on the 5th with shortness of      breath, which was felt to be secondary to COPD exacerbation.  She      was started on prednisone,  from which she is still continuing to      be weaned down.  The patient's wheezing has improved on prednisone      and nebulizer treatments.  She initially was on 2 L of O2, but this      has come down as the patient was able to tolerate her activities      better.  The patient did have a swallowing study done, which showed      chronic aspiration.  Discussed the possibility of PEG tube      placement with family, who did not want it done at this point, as      well as the patient did not want to have it done at this point.      Pulmonary was consulted and also agreed with above.  2. History of diastolic dysfunction.  The patient did not have signs      of CHF throughout this admission.  3. The patient was improving and was considered for possible discharge      on June 11, but overnight the 10th, she developed symptomatic      bradycardia.  Moved down to step-down and then ICU.  Then had to      have a percutaneous pacemaker placed, at which point the patient      was transferred to [facility] where she had  a pacemaker placed by      Dr. Amil Amen.  The pacemaker was interrogated the next day and was      found to be functional.  Initially, the patient developed transient      atrial fibrillation with RVR, but then digoxin and Diltiazem were      restarted and the patient's heart rate had improved.  At the time      of discharge to rehab, the patient's heart rate was controlled with      demand pacing.  She is to continue current regimen and have      followup with cardiology after discharge.  4. Constipation.The patient required aggressive treatment while in-      house.  She was to be discharged on aggressive stool regimen.  5. Thrush.  The patient was initially treated with nystatin with some      improvement, and then switched to Diflucan.  She continued to have      sore throat.  At this time, will order wound culture of her throat.      Continue Diflucan until symptoms resolve.   At the time of transfer to rehab, medications are:  1. Albuterol as needed q.6 h.  2. Norvasc 5 mg p.o. daily.  3. Aspirin 325 mg p.o. daily.  4. Pulmicort inhaler twice a day.  5. Calcium 500 mg p.o. b.i.d.  6. Catapres 0.2 mg per 24 hours patch.  7.  Digoxin 0.125 mg p.o. daily.  8. Diltiazem CD 240 mg p.o. daily.  9. Estradiol 1 g p.o. daily.  10.Atrovent 0.5 inhaled q.6 h.  11.Singulair 10 mg p.o. nightly.  12.Protonix 40 mg p.o. b.i.d. while on steroids.  13.MiraLax 17 g p.o. daily.  14.Prednisone to be decreased to 20 mg p.o. x3 days starting on the      11th and decrease to 20 mg p.o. b.i.d. x3 days, then 20 mg p.o.      daily x3 days, 10 mg p.o. daily for 3 days and then stop.  15.Senna 2 tabs p.o. nightly.  16.Acetaminophen 325 mg p.o. q.4 h.  17.Sorbitol 30 mL q.12 h as needed.  18.Trazodone 50 mg p.o. nightly as needed.   The patient is to have dysphagia diet with thin liquids.   ALLERGIES:  PENICILLIN and SULFA.   FOLLOWUP:  The patient is to have followup with her primary care  Merwyn Hodapp in about 1 week after discharge and then have followup with  cardiology to be arranged prior to discharge from rehab.      Michiel Cowboy, MD  Electronically Signed     AVD/MEDQ  D:  08/09/2007  T:  08/09/2007  Job:  [id number]   cc:   Corky Crafts, MD  Oretha Milch, MD  Pam Drown, M.D.

## 2010-07-09 NOTE — H&P (Signed)
[name], Peggy Harvey NO.:  [id number]   MEDICAL RECORD NO.:  [id number]          PATIENT TYPE:  EMS   LOCATION:  MAJO                         FACILITY:  [facility]   PHYSICIAN:  Lucita Ferrara, MD         DATE OF BIRTH:  [birth year]   DATE OF ADMISSION:  05/28/2008  DATE OF DISCHARGE:                              HISTORY & PHYSICAL   ADDENDUM:  Given the patient's allergy to penicillin we will initiate  the patient on Levaquin 500 mg IV daily.  Additionally will add  metronidazole for empiric treatment of Clostridium difficile.  C.  difficile precautions and stool for C. difficile x3.      Lucita Ferrara, MD  Electronically Signed     RR/MEDQ  D:  05/29/2008  T:  05/29/2008  Job:  [id number]

## 2010-07-09 NOTE — H&P (Signed)
NAMEELENA, DAVIA              ACCOUNT NO.:  [id number]   MEDICAL RECORD NO.:  [id number]          PATIENT TYPE:  INP   LOCATION:  0102                         FACILITY:  [facility]   PHYSICIAN:  Joylene John, MD       DATE OF BIRTH:  1913-05-12   DATE OF ADMISSION:  06/24/2008  DATE OF DISCHARGE:                              HISTORY & PHYSICAL   The patient is coming to [facility] for admission on Jun 24, 2008.   REASON FOR ADMISSION:  Feeling weak.   HISTORY OF PRESENT ILLNESS:  This is a 77 year old female with multiple  comorbid conditions coming in, was recently here and hospitalized and  discharged last weekend.  Please refer to the H&P and discharge summary.  Apparently, the patient was admitted at that time for aspiration  pneumonia, comes in earlier on June 23, 2008, not feeling well, was  sent home with some antibiotics and was treated as if she has had a UTI,  comes back in complaining of feeling worse.  She and her daughter cannot  elaborate on the symptoms, just feels that she has aspirated again and  feels bad, and the daughter wants her mom to get better.  She is aware  that the mom will keep on aspirating; however, she wants to see if there  is something that can be done to fix it.  The patient denies any nausea,  vomiting, fevers, chills, or dysuria.   PAST MEDICAL HISTORY:  1. Recurrent aspiration.  2. COPD.  3. Debility.  4. Paroxysmal AFib.  5. Hypertension.  6. GERD.  7. PVD.  8. Coronary artery disease.  9. CHF.  10.Osteoarthritis.  11.Hyperlipidemia.  12.Pacemaker.   SOCIAL HISTORY:  She lives with her daughter.  There is remote history  of smoking.   FAMILY HISTORY:  Coronary artery disease, lung cancer in brother, breast  cancer in sister.   ALLERGIES:  PENICILLIN and SULFA.   PHYSICAL EXAMINATION:  Vital Signs:  Temperature of 97.9, blood pressure  107/48, pulse 81, respirations 18, O2 sat is 97% on nasal cannula.  GENERAL:  This is a  frail elderly Caucasian woman who is awake and  alert.  Daughter is answering most of the questions which were posed by  me to the patient.  HEENT:  No icterus, pallor, or JVD appreciated.  CARDIOVASCULAR:  The patient has an AFib controlled.  LUNGS:  Decreased breath sounds bilateral bases.  Solitary expiratory  wheeze heard.  LOWER EXTREMITIES:  No edema appreciated.   Chest x-ray shows chronic lung changes of bibasilar scarring without any  definite changes.  UA was cloudy; however, there was no blood or  protein, no nitrites.  There was small leukocyte esterase.  Pertinent  labs showed dig level of 0.3, INR 1.7.  Cardiac enzymes, first set is  negative.  Sodium 138, potassium 3.4, chloride 102, bicarb 29, BUN 9,  creatinine 0.17, glucose 121, calcium 8.8, hemoglobin 11.3, crit 33.7.  White count 16.4, platelets 249.  EKG with AFib and a left bundle-branch  block.  This is unchanged from before.  ASSESSMENT AND PLAN:  Plan is to admit the patient to a regular bed.  The patient likely need placement to a skilled facility; however, the  daughter is resistant to the idea.  Based on the last discharge summary,  the patient needs to be referred for hospice and palliative care;  however, the daughter is resistant to this idea . Given the fact that  the patient has increased white count without any changes in the chest x-  ray even though the UA looks pretty clean while we are awaiting a urine  culture,  plan to give her some antibiotics. Based on her urine culture  and sensitivities from last visit  I am going to give her Cipro 200 mg  IV q.12 h. for 72 hours after which based on the culture either the  antibiotics can be narrowed or if the culture is negative, then  discontinued. I amchoosing not to give ceftriaxone because penicillin is  listed as an allergy.  The patient will likely need a PT/OT consult  while she is in the hospital as well.      Joylene John, MD  Electronically  Signed     RP/MEDQ  D:  06/24/2008  T:  06/24/2008  Job:  [id number]

## 2010-07-09 NOTE — Consult Note (Signed)
Peggy Harvey, Peggy Harvey              ACCOUNT NO.:  [id number]   MEDICAL RECORD NO.:  [id number]          PATIENT TYPE:  IPS   LOCATION:  4034                         FACILITY:  [facility]   PHYSICIAN:  Bertram Millard. Dahlstedt, M.D.DATE OF BIRTH:  March 21, 1913   DATE OF CONSULTATION:  08/17/2007  DATE OF DISCHARGE:                                 CONSULTATION   REASON FOR CONSULTATION:  Urinary retention.   BRIEF HISTORY:  77 year old female who was admitted with failure to  thrive on July 29, 2007.  She received a medical tune-up, and currently  is on the Rehabilitation Unit.  Prior to hospitalization, she denies  longstanding urinary issues.  She had a good stream and felt like she  emptied well.  She is not sought urologic consultation before.   On the Rehab Unit, she was noted to have urinary retention.  She has had  high residuals, but has recently had some urgency of urination and  voided, at times, according to her, but 150 mL.  She is on in-and-out  catheterization.  There is a chance that she will be going home from the  hospital in a couple of days.  Urologic consultation is requested.  She  has been on Urecholine 10 mg t.i.d.   Her medical history significant for COPD, PAF, hypertension,  cholecystectomy, hysterectomy, and appendectomy.   She is allergic to sulfa and penicillin.   At the time of admission to the Rehab Unit, the patient was on Asmanex,  Xopenex, Spiriva, Zantac, Reglan, Ocuvite, digoxin, hydrochlorothiazide,  Estrace, Catapres patch, aspirin, vitamin D and omega-3 supplement.   IMPRESSION:  1. Urinary retention with large residuals.  The patient is currently      on in-and-out catheterizations.  No family members have learned      this yet.  She is set for possible discharge in a couple of days.   PLAN:  1. I would recommend increasing the Urecholine to 25 mg q.8 h. - I      have ordered that.  2. I would recommend teaching a family member who will be with the      patient after discharge intermittent catheterizations.  Hopefully,      this will not be needed.  3. I would assume that once she gets stronger, her bladder function      will improve.      Bertram Millard. Dahlstedt, M.D.  Electronically Signed     SMD/MEDQ  D:  08/17/2007  T:  08/18/2007  Job:  [id number]

## 2010-07-09 NOTE — H&P (Signed)
Peggy Harvey, Peggy Harvey              ACCOUNT NO.:  [id number]   MEDICAL RECORD NO.:  [id number]          PATIENT TYPE:  INP   LOCATION:  4702                         FACILITY:  [facility]   PHYSICIAN:  Lucita Ferrara, MD         DATE OF BIRTH:  [birth year]   DATE OF ADMISSION:  05/28/2008  DATE OF DISCHARGE:                              HISTORY & PHYSICAL   PRIMARY CARE PHYSICIAN:  Pam Drown, M.D.   HISTORY OF PRESENT ILLNESS:  The patient is a 77 year old female with  presentation of generalized weakness, accompanied by cough and  pleuritis.  The patient supposedly was treated for pneumonia with the Z-  Pak.  The patient also had a 7-day course of Levaquin and a steroid  taper outpatient.  The patient continues to feels bad all over.  She  has decreased appetite.  She also has symptoms that were associated with  this, including a persistent diarrhea that was not foul-smelling,  nonbloody and nonmucousy today.  She denies any focal neurological  deficits.  There was no documentation of fevers at home.  Otherwise 12  review of systems reviewed and negative.   PAST MEDICAL HISTORY:  Significant for:  1. COPD, oxygen dependent.  2. Hypertension.  3. History of paroxysmal atrial fibrillation, not a Coumadin      candidate.  4. Documented history of systolic congestive heart failure with      ejection fraction of about 40%-45%.  5. Status post permanent pacemaker for symptomatic bradycardia back in      2009, June.  6. GERD.  7. __________  8. Hypertension.  9. History of systolic dysfunction.  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 Edilia Bo.   SOCIAL HISTORY:  The patient denies drugs or alcohol.  Former smoker,  she quit smoking in 1978.   FAMILY HISTORY:  Noncontributory.   PAST SURGICAL HISTORY:  Status post appendectomy, cholecystectomy,  hysterectomy, lysis of adhesions, pacemaker and stent insertion.   MEDICATIONS AT  HOME:  A detailed review included Asthmanex, Nexium,  Spiriva, Phenergan, Reglan, Catapres, Xopenex, ipratropium,  hydrochlorothiazide, digoxin, aspirin, Mylanta, Coumadin, Pravastatin,  hydrocodone and oxygen at 3 liters nasal cannula.  Note medications are not completely accurate until medication  reconciliation form has been filled out.   REVIEW OF SYSTEMS:  As per HPI, otherwise negative.   PHYSICAL EXAMINATION:  GENERAL: The patient is in no acute distress.  Generally speaking, the patient is an elderly, slender female in no  acute distress, sitting at 45 degrees.  She has pulse oximetry in place.  VITAL SIGNS:  Blood pressure 124/66, pulse 65, respirations 18,  temperature 98.1, pulse ox 98% on 2 liters nasal cannula.  HEENT:  Normocephalic, atraumatic.  Sclerae anicteric.  NECK: Supple.  No JVD or carotid bruits.  CARDIOVASCULAR:  S1, S2.  Regular rhythm.  No murmurs, rubs or clicks.  LUNGS:  Clear to auscultation bilaterally.  No rhonchi, rales or wheeze.  ABDOMEN:  Soft, nontender, nondistended.  Positive bowel sounds.  EXTREMITIES:  No clubbing, cyanosis or  edema.  NEURO:  Patient is alert, oriented x3.  Cranial nerves II-XII grossly  intact.   LABORATORY DATA:  Emergency room course studies include a complete  metabolic panel showing basically normal results with an exception of  mildly low GFR at 59.  Urinalysis: Many bacteria, 7-10 white blood  cells, small amount of leukocytes and positive nitrites.  Digoxin level  0.2.  INR is 2.8, D-dimer is 0.92.  CBC shows a white count of 17.3,  hemoglobin 12.5, hematocrit 37.2, platelet count of 232.  CT angiography  shows a right lower lobe pneumonia and no pulmonary embolism.   ASSESSMENT:  The patient is a 77 year old who presents with generalized  weakness, decreased appetite with:  1. Right lower lobe pneumonia per CT angiography, failure of      outpatient antibiotic treatment.  2. Urinary tract infection.  3. History of  bradycardia, status post pacemaker insertion.  4. Severe oxygen dependent chronic obstructive pulmonary disease.  5. Hypertension.  6. History of paroxysmal atrial fibrillation on Coumadin.  7. Chronic systolic congestive heart failure.   DISCUSSION AND PLAN:  The patient will be admitted to the medical  telemetry floor.  The patient will be initiated on IV Zosyn,  Given the  patient has a right lower lobe pneumonia, we will go ahead and proceed  with ruling out dysphagia and aspiration type pneumonia.  Will proceed  with a modified barium swallow to rule out dysphagia.  Continue  nebulizer treatments.  Blood cultures.  Given her COPD, continue Spiriva  HandiHaler and continue albuterol.  Will also get a dietary consult and  calorie count.  Given history of paroxysmal atrial fibrillation,  continue Coumadin per pharmacy protocol.  Continue oxygen.  Also will  give her some Solu-Medrol x3 q.8 hours for the first initial 24 hours.  DVT and GI prophylaxis with Lovenox and Protonix.      Lucita Ferrara, MD  Electronically Signed     RR/MEDQ  D:  05/28/2008  T:  05/29/2008  Job:  [id number]

## 2010-07-09 NOTE — Discharge Summary (Signed)
NAMECLARYCE, Peggy Harvey              ACCOUNT NO.:  [id number]   MEDICAL RECORD NO.:  [id number]          PATIENT TYPE:  IPS   LOCATION:  4034                         FACILITY:  [facility]   PHYSICIAN:  Ellwood Dense, M.D.   DATE OF BIRTH:  04/01/1913   DATE OF ADMISSION:  08/09/2007  DATE OF DISCHARGE:                               DISCHARGE SUMMARY   DISCHARGE DIAGNOSES:  1. Deconditioning after permanent pacemaker, August 05, 2007.  2. Severe chronic obstructive pulmonary disease.  3. Hypertension.  4. Thrush with probable laryngeal candidiasis.  5. Subcutaneous heparin for deep vein thrombosis prophylaxis.  6. Decreased nutritional storage.   This is a 77 year old white female with severe chronic obstructive  pulmonary disease, admitted on July 29, 2007, with nausea, vomit,  shortness of breath, and failure to thrive.  Chest x-ray with chronic  changes.  EKG with atrial fibrillation.  CT of the chest showed  questionable retention of secretions versus foreign body, questionably  related to a potassium peel for which she was seen by Dr. Sherene Sires of  Pulmonary Services.  I did not feel a bronchoscopy needed to be done at  this time.  I underwent intense treatment of chronic obstructive  pulmonary disease with intravenous steroids.  Cardiology service, Dr.  Samuel Bouche for persistent bradycardia 50 beats per minute with 2 periods of  asystole.  A permanent pacemaker was placed on August 05, 2007, by Dr.  Amil Amen.  Subcutaneous heparin for deep vein thrombosis prophylaxis.  Bouts of constipation with KUB on August 08, 2007, showing some retained  feces; placed on bowel program.   PAST MEDICAL HISTORY:  See discharge diagnoses.  Remote smoker.  No  alcohol.   ALLERGIES:  Penicillin and sulfa.   SOCIAL HISTORY:  Lives with daughter and also local family with  assistance as needed.   MEDICATIONS PRIOR TO ADMISSION:  1. Asmanex daily.  2. Xopenex twice daily.  3. Spiriva daily.  4. Zantac daily.  5. Reglan twice daily.  6. Catapres-TTS 2 patch weekly.  7. Estrace 1 mg daily.  8. Hydrochlorothiazide 12.5 mg daily.  9. Digoxin daily.  10.Ocuvite daily.  11.Aspirin 81 mg daily.  12.Three liters of oxygen as needed.  13.Omega-3 fatty acids.   REHABILITATION HOSPITAL COURSE:  The patient was admitted to Inpatient  Rehab Services with therapies initiated on a 3-hour daily basis  consisting of physical therapy, occupational therapy, speech therapy,  and rehabilitation nursing.  The following issues were addressed during  the patient's rehabilitation stay.  Pertaining to Ms. Harbold's  deconditioning after permanent pacemaker, August 05, 2007, cardiac status  remained stable.  She would follow up Dr. Samuel Bouche as advised.  She  remained on subcutaneous heparin for deep vein thrombosis prophylaxis  throughout her rehab course.  She had a long history of severe chronic  obstructive pulmonary disease.  She remained on multiple nebulizer  treatments.  She had been weaned from high dose of prednisone for  intense treatment of her chronic lung changes.  She was initially on 3 L  of oxygen, this had been changed to humidified face mask at 2  L, which  she would be ongoing at the time of discharge.  She will continue her  nebulizer treatments.  She had refused her Singulair.  She was followed  by speech therapy for dysphagia and intermittent bouts of sore throat.  Her diet was modified to a mechanical soft with thin liquids.  She had  been placed on nystatin as well as Diflucan for questionable thrush with  persistent sore throat, a rapid stress test was negative.  Throat  cultures negative.  No yeast or fungal noted.  Ear nose and throat  specialist, Dr. Lucky Cowboy was consulted, agreed to continue nystatin.  Prednisone taper was completed.  I felt probable laryngeal candidiasis,  secondary to prednisone.  She will complete her Diflucan, was noted some  heavy crusting due to inability to initiate  control of her swallow, thus  her diet had been downgraded.  Ultimate plan was to follow up with Dr.  Lucky Cowboy in 2 weeks after discharge.  There was some fear,  questionable aspiration pneumonia with some consideration of a Panda  feeding tube for her nutritional support, although family had refused  this.  Her blood pressure and heart rate remained monitored on Norvasc.  Her Catapres patch, Cardizem, Lanoxin, cardiac rate between 71 and 80.  Her bowel program had been well-established.  She did have some urinary  retention after family's request to remove Foley catheter tube.  She was  placed on low doses of Urecholine.  Consult was obtained to Urology  Service, Dr. Retta Diones.  Her Urecholine was increased to 25 mg every 8  hours.  She was showing some good voids of 250 and 300 mL with scant of  199-223. At this time it would be at the plan of Urology Services to  continue possible intermittent catheterizations, and family was willing  to do this versus replacing indwelling Foley catheter tube and again Dr.  Retta Diones, again would followup with Urology Services.  Her family  teaching was completed.  She was overall minimal assist.  Her fatigue  factors continued to limit her as well as her chronic lung changes.  She  was discharged to home with anticipation of home health therapies, which  family was somewhat reluctant for, but they did later agree.   LATEST LABS:  Unremarkable.  BUN and creatinine were within normal  limits.  It was discussed, the need for monitoring her intake.  Latest  BUN 10 and creatinine 0.9.  Hemoglobin 12.7 and hematocrit 36.9.   DISCHARGE MEDICATIONS:  At the time of dictation included;  1. Norvasc 5 mg daily.  2. Aspirin 325 mg daily.  3. Catapres-TTS two patch 0.2, change every 7 days.  4. Os-Cal 500 mg twice daily.  5. Lanoxin 0.125 mg daily.  6. Cardizem CD 240 mg daily.  7. Estrace 1 mg daily.  8. Protonix 40 mg twice daily.  9. Nystatin 5 mL p.o.  4 times daily.  10.Arthriten 2 tablets twice daily.  11.The patient is on supply magic mouthwash with Xylocaine 10 mL p.o.      b.i.d.  12.Urecholine 25 mg every 8 hours.  13.Humidified face mask oxygen 2 L, which was arranged for home.   DIET:  Mechanical soft with thin liquids.   SPECIAL INSTRUCTIONS:  The patient should follow up with ear, nose, and  throat Dr. Lucky Cowboy, in 2 weeks for her dysphagia.  Followup Dr.  Samuel Bouche, Cardiology Services for recent permanent pacemaker.  Followup Dr.  Retta Diones, Urology Services for  her urinary retention.      Mariam Dollar, P.A.    ______________________________  Ellwood Dense, M.D.    DA/MEDQ  D:  08/18/2007  T:  08/19/2007  Job:  [id number]   cc:   Ellwood Dense, M.D.  Pam Drown, M.D.  Clinton D. Maple Hudson, MD, FCCP, FACP  Rod Holler, MD  Lucky Cowboy, MD

## 2010-07-09 NOTE — H&P (Signed)
Peggy Harvey, Peggy Harvey              ACCOUNT NO.:  [id number]   MEDICAL RECORD NO.:  [id number]          PATIENT TYPE:  INP   LOCATION:  1418                         [facility]ACILITY[facility]   PHYSICIAN:  Corinna L. Lendell Caprice, MDDATE OF BIRTH:  [birth year]   DATE OF ADMISSION:  07/29/2007  DATE OF DISCHARGE:                              HISTORY & PHYSICAL   CHIEF COMPLAINT:  Shortness of breath.   HISTORY OF PRESENT ILLNESS:  Peggy Harvey  is a pleasant 77 year old white  female who was seen in the office at Dr. Darrell Jewel office today and  subsequently referred to the emergency room.  The patient several days  ago felt that she was choking on a potassium pill and since then has  been short of breath.  She has been unable to lie down flat.  Her legs  seem a bit more swollen than usual.  She had an episode of nausea,  vomiting, dizziness, panic.  She has a history of paroxysmal atrial  fibrillation followed by Dr. Elise Benne in [facility].  Her ejection fraction  is reportedly 40 to 45%, but there is no reported history of congestive  heart failure.  She denies any chest pain.  She is on chronic oxygen for  severe COPD followed by Dr. Maple Hudson.  She also has gastroesophageal reflux  disease.  She is here with her daughter who is her main caregiver.  The  patient has severely limited mobility due to her multiple medical  problems,  advanced age, and degenerative disk disease.  She has had a  good appetite and no diarrhea.   PAST MEDICAL HISTORY:  1. As above.  Also locked bowels and surgery for same.  2. Status post appendectomy, hysterectomy and cholecystectomy.  3. Osteomalacia.   MEDICATIONS:  1. Asmanex daily.  2. Xopenex twice a day.  3. Spiriva daily.  4. Zantac nightly.  5. Reglan twice a day.  6. Catapres patch TTS 2 per week.  7. Estrace 1 mg a day.  8. Hydrochlorothiazide 12.5 mg a day.  9. Digoxin 0.06 to 5 mg p.o. daily.  10.Aspirin 81 mg a day.  11.3 liters of oxygen.  12.Mylanta as  needed.  13.Ibuprofen as needed  14.Vitamin D.  15.Arthritin.  16.Ocuvite.  17.Metamucil.  18.Os-Cal.  19.Omega-3 fatty acid capsules .  20.Tylenol as needed.   SOCIAL HISTORY:  The patient quit smoking decades ago.  No heavy  drinking history.  She lives with her daughter.  She has an advanced  directive but the particulars are unclear.  She tells me, however, that  she would want resuscitation but no long-term life support.   FAMILY HISTORY:  Noncontributory.   REVIEW OF SYSTEMS:  As above, otherwise negative.   PHYSICAL EXAMINATION:  VITAL SIGNS:  Her temperature is 98.5, blood  pressure 140/75, pulse 65, respiratory rate 20, oxygen saturation 94% on  3 liters nasal cannula.  GENERAL:  The patient is an elderly female, in  no acute distress.  HEENT: Normocephalic, atraumatic.  Pupils equal, round, reactive to  light.  Sclerae are nonicteric.  Moist mucous membranes.  Oropharynx is  clear. NECK:  Supple.  No JVD.  LUNGS: She has wheeze bilaterally.  CARDIOVASCULAR:  Irregularly irregular.  No murmurs, gallops or rubs.  ABDOMEN:  Obese, soft, nontender.  GU AND RECTAL:  Deferred.  EXTREMITIES: She has 1+ edema on the right, trace on the left.  Pulses  are intact.  NEUROLOGIC:  The patient is alert and oriented.  Cranial nerves and  sensorimotor exam are grossly intact.  SKIN:  No rash.  PSYCHIATRIC:  The patient is calm and cooperative.   LABS:  CBC is significant for white blood cell count of 11,000,  otherwise it is unremarkable.  Basic metabolic panel:  Essentially  unremarkable.  BNP 182. Point-of-care enzymes:  Negative.  Urinalysis:  Negative.  EKG shows atrial fibrillation with a controlled ventricular  response per ER physician, it is not currently on the chart.  Chest x-  ray shows COPD, no focal air space disease, cardiomegaly, without edema.   ASSESSMENT/PLAN:  1. Dyspnea:  Certainly, the patient may have a bit of congestive heart      failure.  She may also  have a pulmonary embolus or recurrent      aspiration.  I will check a D-dimer and if positive get a CT      angiogram of the chest.  I will try giving a dose of Lasix.  I will      order an echocardiogram, but keep the patient on telemetry.  I will      also check a TSH.  2. Paroxysmal atrial fibrillation, not on Coumadin and not a great      Coumadin candidate.  3. Chronic obstructive pulmonary disease, chronic hypoxia.  4. Degenerative disk disease.  5. Gastroesophageal reflux disease.  6. Osteomalacia.  7. Hypertension  8. Reported history of systolic dysfunction.      Corinna L. Lendell Caprice, MD  Electronically Signed     CLS/MEDQ  D:  07/30/2007  T:  07/30/2007  Job:  [id number]   cc:   Pam Drown, M.D.  Fax: [phone]   Roxanne Mins, PA-C   Clinton D. Maple Hudson, MD, FCCP, FACP  [facility]-Pulmonary Dept  [address]  [place]  [place] [address]

## 2010-07-09 NOTE — Consult Note (Signed)
NEW PATIENT CONSULTATION   Peggy Harvey, Peggy Harvey  DOB:  03/09/1913                                       03/15/2008  CHART#:[id number]    I saw the patient in the office today in consultation concerning the  pain in her left great toe.  This is a pleasant 77 year old woman who  was referred by Dr. Shelva Majestic.  In mid December she developed a fairly  sudden onset of pain in the left great toe.  She also noticed some  slight discoloration to the toe.  She does have significant COPD and is  on home O2 but she is ambulatory with a walker.  She does not describe  any symptoms of claudication.  She has had rest pain in the left great  toe only but no rest pain in the foot at all.  She has had no history of  nonhealing ulcers and no history of DVT.  Her symptoms have remained  fairly stable since mid December.  The only thing which alleviates her  pain is to stand and ambulate.  There are no real aggravating factors.   She was evaluated by Dr. Shelva Majestic and underwent a workup including a  duplex scan at [facility] which showed some stenosis in the  proximal left femoral artery with a superficial femoral artery occlusion  on the left.  There was no significant stenosis on the right side.  Of  note, she also had a carotid duplex scan which showed only mild disease  bilaterally.  Given her persistent pain she was sent to our office for  further vascular evaluation.   PAST MEDICAL HISTORY:  Significant for hypertension, COPD and  degenerative arthritis.  She has also had a pacemaker placed in June of  2009 by Dr. Amil Amen.  She denies any history of diabetes, history of  hypercholesterolemia, history of previous myocardial infarction or  history of congestive heart failure.   FAMILY HISTORY:  There is no history of premature cardiovascular  disease.   SOCIAL HISTORY:  She is widowed. She has two children.  She quit tobacco  in 1978.   REVIEW OF SYSTEMS AND  MEDICATIONS:  Are documented on the medical  history form in her chart.   PHYSICAL EXAMINATION:  General:  This is a pleasant 77 year old woman  who appears stated age.  She is on oxygen.  Vital signs:  Her blood  pressure is 158/76, heart rate is 65.  HEENT:  Unremarkable.  Neck:  Is  supple.  There is no cervical lymphadenopathy.  I do not detect any  carotid bruits.  Lungs:  Are clear bilaterally to auscultation.  Cardiac:  She has a regular rate and rhythm.  Abdomen:  Soft and  nontender.  I cannot palpate an aneurysm.  She has normal pitched bowel  sounds.  She has normal femoral pulses.  I cannot palpate popliteal or  pedal pulses on either side.  Both feet appear adequately perfused.  There is some slight discoloration of the great toe on the left foot.  She has mild bilateral lower extremity swelling.   Doppler study in our office showed noncompressible vessels thus ABIs  could not be obtained.  On the right side, however, she did have  biphasic signals in the posterior tibial and dorsalis pedis positions.  On the left side she had a  biphasic posterior tibial signal and a  monophasic dorsalis pedis signal.  Toe pressure on the right was 88 mmHg  and on the left 59 mmHg.   Based on her exam and workup she has evidence of superficial femoral  artery occlusive disease in the left and likely also tibial occlusive  disease.  Based on her duplex scan at [facility] she has evidence  of a superficial femoral artery occlusion.  I have discussed the option  of proceeding with arteriography to further evaluate the femoral artery  disease on the left side.  I have explained that if she had a lesion  which could potentially have embolized this could possibly be addressed  from an endovascular standpoint and therefore I think it would be worth  considering this.  I do not think she would be a good candidate for an  extensive bypass procedure given her age and significant COPD.  We  have  discussed arteriography and the potential complications including but  not limited to bleeding, arterial injury and renal insufficiency.  I  have also discussed the potential complications of angioplasty including  arterial injury, thrombosis or dissection.  She would like to consider  this further before deciding on whether or not to proceed with  arteriography.  I have explained that I think currently this is not a  limb threatening situation in that there are no open ulcers and her toe  pressure is 59 mmHg on the left and the toe may gradually improve with  time.  I think the main advantage of considering further evaluation is  to prevent further embolization and potentially be able to improve the  circulation to get her toe pain to improve quicker.  If she has a long  segment superficial femoral artery occlusion then it is probably not at  risk for continued embolization and may be not ideal for an endovascular  approach, however, if she has a short focal lesion that is amenable to  angioplasty this could potentially be addressed.   Di Kindle. Edilia Bo, M.D.  Electronically Signed   CSD/MEDQ  D:  03/15/2008  T:  03/16/2008  Job:  [id number]   cc:   Macarthur Critchley. Shelva Majestic, M.D.  Dr Uvaldo Rising

## 2010-07-09 NOTE — Discharge Summary (Signed)
Peggy Harvey, Peggy Harvey              ACCOUNT NO.:  [id number]   MEDICAL RECORD NO.:  [id number]          PATIENT TYPE:  INP   LOCATION:  4702                         FACILITY:  [facility]   PHYSICIAN:  Hollice Espy, M.D.DATE OF BIRTH:  1913-10-07   DATE OF ADMISSION:  05/28/2008  DATE OF DISCHARGE:  06/02/2008                               DISCHARGE SUMMARY   PRIMARY CARE PHYSICIAN:  Pam Drown, M.D.   DISCHARGE DIAGNOSES:  1. Aspiration pneumonia.  2. History of atrial fibrillation, rate controlled.  3. Urinary tract infection.  4. Protein calorie malnutrition.  5. Deconditioning.  6. Hypertension.  7. History of chronic obstructive pulmonary disease.  8. Supratherapeutic international normalized ratio, now down to a      stable level.   DISCHARGE MEDICATIONS FOR THIS PATIENT:  The patient will continue all  her previous medicines.  These are as follows:  1. MiraLax 1 scoop daily p.r.n.  2. Motrin p.r.n.  3. Immune powder 1 scoop daily.  4. Elderberry immune booster 1-2 p.o. daily.  5. Coumadin 2.5 p.o. daily.  6. Promethazine HCL 25 p.o. daily p.r.n.  7. Vicodin 5/235 p.o. q.6 h p.r.n.  8. Iron 325 p.o. daily.  9. Vitamin C 1500 daily.  10.Vitamin D 1000 units daily.  11.Arthriten  1000 mg p.o. b.i.d.  12.Vision formula p.o. b.i.d.  13.Os-Cal D p.o. b.i.d.  14.Senokot 1 tablet p.o. b.i.d.  15.Omega-3 p.o. b.i.d.  16.Metamucil 1-2 tabs p.o. b.i.d.  17.Asmanex 1 puff daily.  18.Xopenex 2 puffs q.6 h p.r.n.  19.Spiriva 18 mcg inhaled daily.  20.Catapres-TTS-2 patch topically weekly.  21.Xopenex nebs 0.63 four times daily.  22.Cardizem CD 240 p.o. daily.  23.Digoxin 0.125 p.o. daily.  24.HCTZ 12.5 p.o. daily.  25.Nexium 40 p.o. daily.  26.Reglan 10 p.o. 4 times daily.  27.Pravastatin 10 mg p.o. bedtime.  28.K-Dur liquid 1-1/2 teaspoons p.o. daily.   NEW MEDICATIONS:  1. Flagyl 500 mg p.o. q.8 h x3 days.  2. Ceftin 500 mg p.o. b.i.d. x3 days.   DISCHARGE  DIET FOR THIS PATIENT:  Dysphagia 3 diet with mechanical soft  with honey thick liquid, following reflux precautions.  The patient is  also advised to perform oral care with plain water allowed 30 minutes  after eating, brushing teeth, and rinsing mouth with mouthwash, small  sips of water, water only.  Do not eat anything with water after 30  minutes, having cleaned teeth, clean again.   HOSPITAL COURSE:  The patient is a 77 year old white female with past  medical history of atrial fibrillation, COPD and hypertension as well as  systolic CHF with EF of 40-45%, presented with cough, pleuritis, and had  been treated for pneumonia as an outpatient with Z-Pak as well as  Levaquin.  She continued to feel rough.  She was started on broad-  spectrum antibiotics and because of her failed outpatient therapy, it  was suspected perhaps she was having problems with aspiration.  Speech  therapy performed a modified barium.  The patient was found to have  significant aspiration issues and she was placed on a dysphagia 3  mechanical soft diet with honey thick liquids.  Over the next several  days she continued to improve.  An INR was noted to be slightly  subtherapeutic.  Her doses were decreased and this was felt to be  secondary to some mild volume overload.  Urine was positive for E. coli  but was covered by her broad-spectrum antibiotics.  Over the next  several days she continued to improve.  Follow-up chest x-ray on June 01, 2008 showed much resolution of her symptoms.  She had a brief  increase in her white count on May 31, 2008 that was felt to be  secondary to initial steroids that she received.  She is feeling  markedly better and speech therapy followed up and recommended [employer] Protocol to decrease risk of aspiration pneumonia by allowing  plain water only between meals and after thorough oral care.  PT/OT who  has seen the patient through [facility] will continue to follow  her.  She  remained stable during this hospitalization.   PLAN:  Will be for the patient to be discharged home.  Follow up with  her PCP, Dr. Gweneth Dimitri, in the next 7-10 days, 3 more days of  antibiotic to complete a full 7-day course, home health PT/OT, and  speech therapy will follow the patient at home through [place], which  was already established.  The patient's overall disposition is improved.  Activity will be as per home health PT/OT.      Hollice Espy, M.D.  Electronically Signed     SKK/MEDQ  D:  06/02/2008  T:  06/02/2008  Job:  [id number]   cc:   Pam Drown, M.D.

## 2010-07-09 NOTE — Consult Note (Signed)
Peggy Harvey, Peggy Harvey              ACCOUNT NO.:  [id number]   MEDICAL RECORD NO.:  [id number]          PATIENT TYPE:  INP   LOCATION:  1518                         FACILITY:  [facility]   PHYSICIAN:  Wilson Singer, M.D.DATE OF BIRTH:  [birth year]   DATE OF CONSULTATION:  DATE OF DISCHARGE:  06/28/2008                                 CONSULTATION   This is a 77 year old lady who is hospice eligible with a diagnosis of  lung disease.  She has a history of oxygen-dependent COPD and history of  recurrent aspiration pneumonia, the cause of which is not entirely  clear.  Her palliative performance score is 50%.  Her comorbid  conditions include hypertension, paroxysmal atrial fibrillation, history  of bradycardia, status post pacemaker, peripheral vascular disease,  coronary artery disease, diastolic congestive heart failure, and  degenerative joint disease.  Should the disease run its normal course,  the patient is deemed to have a prognosis of 6 months or less.      Wilson Singer, M.D.  Electronically Signed     NCG/MEDQ  D:  10/11/2008  T:  10/11/2008  Job:  [id number]

## 2010-07-09 NOTE — Discharge Summary (Signed)
Peggy Harvey, Peggy Harvey              ACCOUNT NO.:  [id number]   MEDICAL RECORD NO.:  [id number]          PATIENT TYPE:  INP   LOCATION:  3712                         FACILITY:  [facility]   PHYSICIAN:  Corinna L. Lendell Caprice, MDDATE OF BIRTH:  Mar 18, 1913   DATE OF ADMISSION:  06/11/2008  DATE OF DISCHARGE:  06/17/2008                               DISCHARGE SUMMARY   DISCHARGE DIAGNOSES:  1. Recurrent pneumonia most likely aspiration.  2. Chronic hypoxia secondary to severe chronic obstructive pulmonary      disease.  3. Continue oropharyngeal dysphasia with recurrent aspiration.  4. Debility.  5. Paroxysmal atrial fibrillation.  6. Hypertension.  7. Gastroesophageal reflux disease.  8. Peripheral vascular disease.  9. Coronary artery disease.  10.History of congestive heart failure, compensated.  11.Osteoarthritis.  12.Chronic constipation.  13.History of hyperlipidemia.  14.Polypharmacy.  15.Pacemaker.  16.Code status, remains full code that she was not want to be on      prolonged life support.   DISCHARGE MEDICATIONS:  She has been advised to stop Coumadin as the  risk is greater than the benefit I believe in her case.  Instead, she  may take aspirin 325 mg p.o. daily for stroke prophylaxis.  She is  encouraged to use ibuprofen sparingly or stop, but altogether change her  calcium to chewable form.  Stop Diltiazem CD instead.  Start Diltiazem  30 mg p.o. t.i.d.  Stop pravastatin.  I have also encouraged her and her  daughter to cut down on the nonessential  medications/vitamin/minerals/immune powders including her Arthro 10, her  Vision vitamins, vitamin C, elderly immune booster powder.  I have also  recommended to stop hydrochlorothiazide unless edema were to occur, as  she is at risks for dehydration due to her dysphasia, diet may be  changed to soft with thin liquids.  Her speech as thickened liquids does  not seem to be improving any outcomes and the patient prefers thin  liquids.  Follow up with Dr. Gweneth Dimitri.   CONSULTATIONS:  Speech therapy, physical therapy, palliative care  medicine.   PROCEDURES:  None.   CONDITION:  Improved, but long-term prognosis guarded due to the  recurrent aspiration pneumonia.   LABORATORY DATA:  Admission white blood cell count 14,000 which  normalized, hemoglobin 12.3, hematocrit 36.  After hydration, hemoglobin  is 9.8, hematocrit 29.  INR on admission 1.9 and at discharge 1.7.  Admission sodium 132, potassium 3.3, chloride 94, glucose 134, albumin  3.1, otherwise unremarkable.  Digoxin level less than 0.2, LDL 71, HDL  36, triglycerides 58, cholesterol 119.  Urinalysis negative.  Blood  cultures negative.   SPECIAL STUDIES:  Radiology; Chest x-ray on admission showed low volume  with underlying airspace disease/pneumonia.  Esophagram with barium  tablet showed with one swallow penetration of material into the upper  airway, which cleared with coughing.  No esophageal mass obstruction,  but retained barium sulfate tablet during the period of observation.   HISTORY AND HOSPITAL COURSE:  Please see H and P for complete details.  The patient is a 77 year old white female patient of Dr. Corliss Blacker with  multiple  medical problems including dysphasia and recent pneumonia.  Her  daughter noted that she has had no worsening of her cough, but her  appetite had been poor and she seemed less alert.  The patient had been  discharged from the hospital 2 weeks prior with aspiration pneumonia.  She has been seen by Speech Therapy multiple times.  She has problems  with intermittent pale, dysphasia.  The patient was found to have a  fever of 101.2.  She is on chronic oxygen and was saturating well.  She  had poor inspiratory effort, but decreased breath sounds without  wheezes, rhonchi, or rales.  Dry mucous membranes, leukocytosis in an  abnormal chest x-ray.  She was started on IV fluids, moxifloxacin, and  clindamycin.  As she  had been hospitalized twice within the past 30  days, as well as having a history of previous aspiration pneumonia, her  prognosis is guarded.  The patient and daughter had difficulty coming to  grips with a fact that she was continued to have aspiration and most  likely pneumonia would be recurrent and contribute to her demise.  Palliative Care was consulted to assist with goals of care and to  provide family support and education regarding chronic aspiration.  The  patient wanted to remain full code, but as she has said before would not  want to be on long-term life support.  I feel that Coumadin in her is  more risky then beneficial.  I have discussed this with the patient and  she prefers to stop Coumadin and start aspirin instead which I  recommended.  She has a fall risk and it may not to prolong her life or  improve the quality of her life.  I also recommended to family members  that her various powders and vitamins be limited as each medication  provides an increased likelihood of aspiration.  Her daughter was very  resistant to this idea, but I encouraged her to continue to talk to Dr.  Corliss Blacker.   Speech Therapy was consulted and felt that a modified barium swallow  would not change recommendations.  She did however discuss the fact that  patient's aspiration would be chronic.  I have asked that all  medications be crushed due to the retained barium tablet.   Due to the recurrent aspiration and dysphasia as well as the patient  being clinically dehydrated on admission, I have recommended that she  stop hydrochlorothiazide indefinitely.  By the time of discharge, the  patient was tolerating a diet.  She was feeling better.  Her  leukocytosis and mental status had improved.  The patient prefers to be  on thin liquids and a mechanical soft diet, which Speech Therapy says is  fine as the dysphagia diet has not really improved her quality of life  or diminished her frequency of  aspiration pneumonia.  Total time on the  day of discharge is 45 minutes.      Corinna L. Lendell Caprice, MD  Electronically Signed     CLS/MEDQ  D:  06/17/2008  T:  06/17/2008  Job:  [id number]   cc:   Pam Drown, M.D.

## 2010-07-09 NOTE — Consult Note (Signed)
Peggy Harvey, Peggy Harvey              ACCOUNT NO.:  [id number]   MEDICAL RECORD NO.:  [id number]          PATIENT TYPE:  INP   LOCATION:  1232                         FACILITY:  [facility]   PHYSICIAN:  Rod Holler, MD     DATE OF BIRTH:  04-26-1913   DATE OF CONSULTATION:  DATE OF DISCHARGE:                                 CONSULTATION   REASON FOR CONSULTATION:  Bradycardia, asystole   HISTORY:  A 77 year old female admitted with swallowing issues about 6  days ago.  Tonight, the patient had an episode of asystole on the floor,  a code was called and short term CPR was initiated.  The patient  regained a pulse and was ultimately sent to the ICU.  The patient  experienced another asystolic episode in the ICU and was  transcutaneously paced.  Upon my arrival, the patient was being  transcutaneously pacing at a heart rate of 50.  The patient had no  complaints of chest pain except for when she was being paced.  She had  no worsening shortness of breath.  Prior to my arrival, the patient was  started on dopamine drip upon my recommendations.  While I was at the  bedside, the patient stopped pacing and had a negative heart rhythm in  the 90s and atrial fibrillation.   PAST MEDICAL HISTORY:  1. Atrial fibrillation.  2. History of CHF with an EF of 40-45% by an outside hospital.  3. COPD.  4. GERD.  5. Degenerative disk disease.  6. Status post appendectomy.  7. Status post cholecystectomy.  8. Hypertension.  9. Chronic aspiration.   MEDICATIONS:  1. Albuterol.  2. Norvasc.  3. Aspirin.  4. Pulmicort.  5. Calcium/vitamin D.  6. Klonopin.  7. Digoxin.  8. Subcutaneous heparin.  9. Insulin.  10.Claritin.  11.Reglan.  12.Singulair.  13.Avelox.  14.Prednisone.   ALLERGIES:  PENICILLIN, SULFA   SOCIAL HISTORY:  The patient is a former smoker.   FAMILY HISTORY:  Noncontributory.   REVIEW OF SYSTEMS:  All systems reviewed in detail and are negative  except as noted in the  history of present illness.   PHYSICAL EXAMINATION:  VITAL SIGNS:  Blood pressure 140/90, heart rates  in the 90s.  GENERAL:  Elderly female, alert, oriented x3, no apparent distress.  HEENT:  Atraumatic, normocephalic, pupils equal, round and reactive to  light, extraocular movements intact.  NECK:  Supple.  No adenopathy.  No JVD.  CHEST:  Lungs with expiratory wheezing throughout.  CORONARY:  Irregularly irregular, 1/6 systolic ejection murmur.  ABDOMEN:  Soft, nontender, nondistended.  EXTREMITIES:  No clubbing, cyanosis or edema.  NEUROLOGIC:  No focal deficits.   LABORATORY DATA:  White blood cell count 14,000, hematocrit 43, platelet  count 316, sodium 128, potassium 4.7, chloride 92, bicarb 28, BUN 26,  creatinine 1.2, glucose 190, CK 47, CK-MB of 5, troponin less than 0.5.   IMPRESSION AND PLAN:  A 77 year old female who had two periods of  asystole tonight and then a period requiring transcutaneous pacing.  Currently, the patient is on dopamine with heart rates in  the 90s and  blood pressures in the 150/90s and is hemodynamically stable.   RECOMMENDATIONS:  1. Transfer to [facility].  2. Continue dopamine  3. Hold digoxin and get a digoxin level.  4. Transthoracic echocardiogram.  5. Keep pacer pads on.      Rod Holler, MD  Electronically Signed     TRK/MEDQ  D:  08/05/2007  T:  08/05/2007  Job:  [id number]

## 2010-07-09 NOTE — Consult Note (Signed)
Peggy Harvey, Peggy Harvey              ACCOUNT NO.:  [id number]   MEDICAL RECORD NO.:  [id number]          PATIENT TYPE:  INP   LOCATION:  3712                         FACILITY:  [facility]   PHYSICIAN:  Wilson Singer, M.D.DATE OF BIRTH:  [birth year]   DATE OF CONSULTATION:  06/16/2008  DATE OF DISCHARGE:                                 CONSULTATION   REFERRING PHYSICIAN:  Corinna L. Lendell Caprice, MD, [facility].   CONSULTING PHYSICIAN:  Wilson Singer, MD   REASON FOR CONSULTATION:  To establish goals of care.   IMPRESSION:  1. Dyspnea - secondary to aspiration pneumonia which is recurrent.  2. Dysphagia with recurrent aspiration with the etiology been unclear.  3. Palliative performance score of 50% at present levels.   RECOMMENDATIONS:  1. Continue full code status, although, the patient said that she      would not want extended life support in the event of poor      prognosis.  2. Would consider discontinuing some medications, for example, statins      and Coumadin.  3. The patient is hospice eligible with a diagnosis of lung disease,      although, the family may wish to have [place] [facility] that were in      place prior to her hospitalization upon discharge at this point.   HISTORY:  This is a very pleasant 77 year old lady who was admitted to  the hospital approximately 4 days ago with weakness and was found to  have another recurrent aspiration pneumonia.  She already has oxygen-  dependent COPD and she tells me that her baseline state is that she sits  most of the day and reads book or watches television.  She does have 3  L/min of oxygen and does get shortness of breath sometimes.  She does  get short of breath on dressing.  She usually does not go out much,  although, does on some occasions.  More recently, she has had recurrent  bouts of aspiration pneumonia requiring hospitalization.  The etiology  of the aspiration is not entirely clear.   PAST MEDICAL  HISTORY:  Significant for hypertension; paroxysmal atrial  fibrillation currently in sinus rhythm; history of bradycardia status  post pacemaker; gastroesophageal reflux disease; peripheral vascular  disease; coronary artery disease, stable; diastolic congestive heart  failure; degenerative joint disease; hyperlipidemia.   PAST SURGICAL HISTORY:  Appendectomy, hysterectomy, cholecystectomy.   ALLERGIES:  PENICILLIN and SULFA.   CURRENT MEDICATIONS:  1. Atrovent nebulizer every 6 hours.  2. MiraLax 17 g daily.  3. Senokot 1 tablet b.i.d.  4. Xopenex nebulizer q.i.d.  5. Digoxin 0.125 mg daily.  6. Protonix 80 mg daily.  7. Metoclopramide 10 mg q.i.d.  8. Simvastatin 5 mg at bedtime.  9. Guaifenesin 30 mL q.i.d.  10.Avelox 400 mg daily.  11.Coumadin depending on INR.  12.Intravenous fluids with D5 half normal saline at 50 mL an hour.   SOCIAL HISTORY:  The patient lives with her daughter.  She does not  smoke having quit smoking in 1978.  Prior to that, she had smoked for 2-  3  decades.  She does not drink alcohol.   FAMILY HISTORY:  Noncontributory.   PHYSICAL EXAMINATION:  GENERAL:  The patient is afebrile and  hemodynamically stable.  VITAL SIGNS:  She is saturating above 90% on 3 liters of oxygen.  Blood  pressure 113/55, pulse 60 and in sinus rhythm.  She is not clubbed.  She  does not have peripheral or central cyanosis.  HEART:  Heart sounds are present and normal without murmurs.  RESPIRATORY:  Lung fields are clear with a few crackles at the both  bases.  ABDOMEN:  Soft and nontender with no hepatosplenomegaly.  NEUROLOGIC:  She is alert and oriented without any focal neurologic  sign.   RELEVANT DATA:  Chest x-ray indicative of bilateral infiltrates more on  the right than the left.  Albumin 3.1.   DISCUSSION:  This 77 year old lady has moderate-to-severe COPD, but also  appears to have chronic recurrent aspiration which is going to be the  driving force behind  her demise.  She is hospice eligible with a  diagnosis of COPD.  The family will consider all the options available  to them.   Time spent was 90 minutes, more than 50% of which was involved in  counseling, coordination, and discussion of pathophysiology of disease  process and the concept of hospice and palliative medicine.      Wilson Singer, M.D.  Electronically Signed     NCG/MEDQ  D:  06/16/2008  T:  06/17/2008  Job:  [id number]

## 2010-07-09 NOTE — Discharge Summary (Signed)
[name], MAIDEN NO.:  [id number]   MEDICAL RECORD NO.:  [id number]          PATIENT TYPE:  INP   LOCATION:  1518                         FACILITY:  [facility]   PHYSICIAN:  Ramiro Harvest, MD    DATE OF BIRTH:  1913/12/31   DATE OF ADMISSION:  06/24/2008  DATE OF DISCHARGE:  06/28/2008                               DISCHARGE SUMMARY   PRIMARY CARE PHYSICIAN:  Pam Drown, M.D., [facility].   DISCHARGE DIAGNOSES:  1. Extended spectrum beta lactamase E-coli urinary tract infection.  2. Hypokalemia, resolved.  3. Leukocytosis secondary to problem #1.  4. Atrial fibrillation.  5. Hypertension.  6. History of dysphagia with recurrent pneumonia.  7. Chronic hypoxia secondary to severe COPD.  8. Severe chronic obstructive pulmonary disease.  9. Gastroesophageal reflux disease.  10.History of congestive heart failure.  11.Debility.  12.Gastroesophageal reflux disease.  13.Peripheral vascular disease.  14.Coronary artery disease.  15.Osteoarthritis.  16.Chronic constipation.  17.History of hyperlipidemia.  18.Status post pacemaker.   DISCHARGE MEDICATIONS:  1. Macrobid 100 mg p.o. b.i.d. x12 days.  2. Xopenex 2 puffs q.6 h p.r.n.  3. Spiriva one inhalation daily.  4. Catapres patch 0.2 mg weekly.  5. Xopenex nebulizers q.i.d.  6. Ipratropium nebulizers  as needed.  7. Cardizem CD 240 mg p.o. daily.  8. Digoxin 0.125 mg half a tablet p.o. daily  9. HCTZ 12.5 mg p.o. daily  10.Nexium 40 mg p.o. daily.  11.Reglan 10 mg p.o. q.i.d.[name] Kitchen  12.Potassium elixir 1-1/2 teaspoons daily.  13.Coumadin 2.5 mg p.o. daily.  14.Promethazine 25 mg as needed.  15.Vicodin 5/325 mg p.o. p.r.n.  16.Ferrochel 1 tablet p.o. daily.  17.Vitamin C 1500 mg p.o. daily.  18.Aspirin 81 mg p.o. daily  19.Vitamin D 1000 units p.o. daily.  20.Arthriten 1000 mg p.o. b.i.d.  21.vision formula 1 tablet p.o. b.i.d.  22.Senna 1 tablet p.o. b.i.d.  23.Omega-3 500 mg 2 tablets p.o.  b.i.d.  24.Metamucil 1-2 tablets p.o. b.i.d.  25.MiraLax one scoop p.o. daily.  26.Ibuprofen as needed  27.Tylenol as needed.  28.Os-Cal D 1 tablet p.o. b.i.d.   DISPOSITION/FOLLOW UP:  The patient will be discharged home with home  health PT, OT and R.N. as well as home O2 walker.  The patient is to  follow up with PCP in one week post discharge.   CONSULTATIONS:  None.   PROCEDURE PERFORMED:  A chest x-ray was obtained on June 23, 2008 that  showed chronic lung changes with bibasilar scarring.  No definite  infiltrates, edema or effusions.   ADMISSION HISTORY AND PHYSICAL:  Ms. Peggy Harvey is a 77 year old  female with multiple comorbid conditions that had presented, initially  admitted, and hospitalized and discharged one week prior to admission.  Please refer to H and P and discharge summary for that hospitalization.  Apparently the patient was admitted at the time for aspiration  pneumonia, came in early on June 23, 2008 and was not feeling well and  was sent home with some antibiotics and treated as if she had a urinary  tract infection, came in complaining of feeling worse.  The patient and  her daughter could not elaborate on her symptoms.  She just felt that  she had aspirated again and felt bad.  The patient's daughter wanted her  mom to get better.  She was aware that the mom will keep aspirating;  however, she wants to see if there is anything that can be done to fix  it.  The patient denied any nausea, vomiting, no fevers, no chills, no  dysuria.   PHYSICAL EXAMINATION:  Per admitting physician:  VITAL SIGNS:  Temperature 97.9, blood pressure 107/48, pulse of 81,  respirations 18, satting 97% on nasal cannula.  GENERAL:  The patient was a frail, elderly, Caucasian female who was  awake and alert.  Daughter was answering most of the questions which  were posed.  HEENT: Normocephalic, atraumatic.  Pupils equal, round and reactive to  light and accommodation.   Extraocular movements intact.  Anicteric.  NECK:  Supple.  No lymphadenopathy.  No JVD.  LUNGS:  Decreased breath sounds in the bases bilaterally.  Solitary  excretory wheeze was heard.  CARDIOVASCULAR:  Irregularly irregular.  ABDOMEN:  Soft, nontender, nondistended, positive bowel sounds.  EXTREMITIES:  No clubbing, cyanosis or edema.   LABORATORY DATA:  Admission labs:  Chest x-ray as stated above.  Urinalysis was cloudy, no blood.  No proteins, no nitrites, though  small leukocyte esterase.  Pertinent labs showed digoxin level 0.3, INR  of 1.7.  First set of cardiac enzymes were negative.  Sodium 138,  potassium 3.4, chloride 102, bicarb 29, BUN 9, creatinine 0.17, glucose  of 121, calcium 8.8.  Hemoglobin of 11.3, hematocrit 33.7, white count  of 16.4, platelets of 249.   EKG with atrial fibrillation and left bundle branch block which was  unchanged from a prior EKG.   HOSPITAL COURSE:  1. Extended spectrum beta lactamase E-coli urinary tract infection.      The patient was admitted initially with complaints of just      generalized weakness and feeling weak.  The patient was hydrated      with IV fluids.  Urine cultures were obtained which came back      positive for extended spectrum beta lactamase E-coli.  The patient      was initially placed on IV ciprofloxacin but when urine cultures      came back it was resistant to ciprofloxacin and the patient was      subsequently changed to IV imipenem.  The patient was maintained on      IV imipenem during the hospitalization, remained afebrile with      improved white count.  Urine cultures were sensitive to Macrobid      and as such the patient was discharged home on oral Macrobid to      complete 12 more days to complete a 2-week course of antibiotic      therapy.  The patient will be discharged in stable and improved      condition to follow up with PCP as an outpatient.  2. Hypokalemia.  The patient on admission was noted to be  hypokalemic.      The patient's potassium was repleted and had resolved by day of      discharge.  The rest of the patient's chronic medical issues      remained stable throughout the hospitalization and the patient was      discharged in stable and improved condition.  On day of discharge      vital  signs:  Temperature of 98.3, pulse of 70, respirations 20,      blood pressure 146/72, saturating 100% on 3 liters nasal cannula.   Discharge labs:  Sodium of 141, potassium 4, chloride 108, bicarb 29,  glucose 112, BUN 4, creatinine 0.77 and a calcium of 8.7, PT of 25.3,  INR of 2.1.  CBC with a white count of 6.2, hemoglobin 9.8, hematocrit  28.9, platelet count of 211.  It was a pleasure taking care of Ms.  Orpha Bur, MD  Electronically Signed     DT/MEDQ  D:  08/30/2008  T:  08/31/2008  Job:  [id number]   cc:   Pam Drown, M.D.  Fax: [phone]

## 2010-07-09 NOTE — H&P (Signed)
Peggy Harvey, Peggy Harvey              ACCOUNT NO.:  [id number]   MEDICAL RECORD NO.:  [id number]          PATIENT TYPE:  INP   LOCATION:  3712                         FACILITY:  [facility]   PHYSICIAN:  Darryl D. Prime, MD    DATE OF BIRTH:  1913/04/22   DATE OF ADMISSION:  06/11/2008  DATE OF DISCHARGE:                              HISTORY & PHYSICAL   CODE STATUS:  FULL CODE.   PRIMARY CARE PHYSICIAN:  Selena Batten, M.D.   CHIEF COMPLAINT:  Weakness.   HISTORY OF PRESENT ILLNESS:  Ms. Tuft is a 77 year old female with a  history of COPD, oxygen dependent 24 hours, 3 liters of nasal cannula.  Recent admission for aspiration pneumonia, deconditioning, and a urinary  tract infection. History of atrial fibrillation, history of possible  chronic systolic heart failure. Last echocardiogram in June 2009 showed  an EF of 55% to 60%, however, who notes feeling horrible starting this  morning. When she woke up, she woke up feeling weak with myalgias and  felt horrible around 5:30 p.m., so presented  here to the emergency  room. Temperature in the emergency room was 101.2. She has a history of  chronic aspiration. She had a swallowing function study performed on a  recent hospitalization on May 29, 2008 showing aspiration, primarily to  liquids. She was placed on a dysphagia 3 mechanical soft diet with honey  thick liquids. The patient notes that she has never aspirated but does  note that she may have problems with. She has never clinically aspirated  at home to where she would eat and food would go in the wrong direction.  The patient had a CT scan on that hospitalization showing a right lower  lobe pneumonia, concerning for possible aspiration. She was placed on a  [place] Levels free water protocol as well. She is seen by [place] at home. The  patient notes a chronic cough that is dry and has not worsened. She  denies any recent possible aspiration event. Her appetite has been poor  and over the  last 12 hours, she has only eaten a slice of toast. In the  emergency room, the patient was found to have a temperature of 101. She  had a chest x-ray, which showed bibasilar atelectasis, which is  unchanged from prior chest pain. Prior chest x-ray did not read a  pneumonia, whereas the CT at that time read the pneumonia. In the  emergency room, the patient was given Rocephin and Azithromycin, and  Clindamycin.   PAST MEDICAL HISTORY:  1. History of possible pneumonia in early April 2010, treated, then      became ill and admitted for possible aspiration pneumonia May 28, 2008 through June 02, 2008.  2. History of hypertension.  3. History of paroxysmal atrial fibrillation, on Coumadin.  4. History of bradycardia, status post pacemaker placement.  5. History of gastroesophageal reflux disease.  6. 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  7. History of peripheral vascular disease with left leg pain.  8. History of coronary artery disease .  9. History of  possible congestive heart failure. She has a diastolic      component.  10.History of degenerative joint disease.  11.History of constipation.  12.History of hyperlipidemia.   PAST SURGICAL HISTORY:  1. Status post appendectomy.  2. History of hysterectomy.  3. History of cholecystectomy.   ALLERGIES:  PENICILLIN, SULFA.   MEDICATIONS:  1. MiraLAX 1 daily p.r.n.  2. Motrin p.r.n.  3. Immune powder once daily.  4. Elderberry immune Boost once 2 p.o. daily.  5. Coumadin 2.5 mg p.o. daily.  6. Promethazine 25 mg p.o. p.r.n.  7. Vicodin 5/325 p.o. q.6 hours p.r.n.  8. Iron 325 p.o. daily.  9. Vitamin C 1500 daily.  10.Vitamin D 1000 units daily.13.  Os-Cal p.o. b.i.d.  11.Senokot 1 tablet p.o. b.i.d.  12.Asmanex 1 puff daily.  13.Xopenex 2 puffs q.6 hours p.r.n.  14.Spiriva 18 mcg inhaled daily.  15.Catapres TTS patch topical weekly.  16.Xopenex nebulizer 0.6 four times a day.  17.Cardizem CD 240 mg daily.  18.Digoxin  0.125 mg daily.  19.Hydrochlorothiazide 12.5 mg daily.  20.Nexium 40 mg daily.  21.Reglan 10 mg p.o. 4 times a day.  22.Pravastatin 10 mg p.o. q.h.s.  23.K-Dur liquid 1 to 1/2 teaspoon p.o. daily.  24.She was discharged on Flagyl and Ceftin for 3 days. This is      discontinued.   DISCHARGE DIET:  Dysphagia II diet with mechanical soft with honey thick  liquids with reflux precautions.   SPECIAL INSTRUCTIONS:  She is to perform significant oral care and not  to eat anything but water after 30 minutes of cleansing teeth.   REVIEW OF SYSTEMS:  14 point review of systems negative unless stated  above.   FAMILY HISTORY:  Coronary artery disease, breast cancer in a sister, and  lung cancer in her brother.   SOCIAL HISTORY:  Quit smoking in 1978. Widowed. Lives with daughter. She  gets significant care at home.   PHYSICAL EXAMINATION:  VITAL SIGNS:  Temperature 101.2, blood pressure  129/66, respiratory rate 18, pulse 82. Saturations are 98% on 3 liters  nasal cannula.  HEENT:  Normocephalic and atraumatic. Pupils are equal, round, and  reactive to light. Extraocular movements intact. Oropharynx is dry.  NECK:  Supple. No lymphadenopathy or thyromegaly. No carotid bruits.  LUNGS:  Some decreased breath sounds bilaterally with poor inspiratory  effort, particularly at the bases.  ABDOMEN:  Soft, nontender, and nondistended. No hepatosplenomegaly.  CARDIOVASCULAR:  Regular rate and rhythm.  EXTREMITIES:  No clubbing or cyanosis. There is decreased pulses  dorsalis pedis bilaterally.  NEUROLOGIC:  Alert and oriented times four. Cranial nerves 2-12 are  grossly intact. Strength and sensation grossly intact.  SKIN:  No rashes.   LABORATORY DATA:  White count of 14 with hemoglobin of 12.3 and  hematocrit of 32.1. Platelets 172,000. Segs 92. Sodium 132, potassium  3.3, chloride 94, bicarb 29, BUN 11, creatinine 0.7, glucose 134,  albumin low at 3.1, total protein 5.4, otherwise normal  LFT's. INR 1.9,  digoxin less than 0.2, urinalysis negative.   Chest x-ray, portable, shows bibasilar atelectasis, rule out aspiration.  No change compared to chest x-ray of May 31, 2008.   EKG is pending.   ASSESSMENT:  1. History of oxygen dependent chronic obstructive pulmonary disease.  2. Coronary artery disease.  3. Chronic aspiration.  4. Recent aspiration pneumonia, who now presents with symptoms      concerning for the aspiration. She notes myalgias and fever. No  chronic cough.   PLAN:  At this time, she will be placed on a dysphagia diet. Per the  daughter, she wants to still be fed. Will have GI see her regarding  management of possible dysphagia aspiration. Will gently hydrate her and  place on Moxifloxacin and Clindamycin and get blood cultures and sputum  cultures. Given her atrial fibrillation history, will check an EKG. She  will be on nebs, oxygen, and Mucinex. Will continue her home  medications. GI and DVT prophylaxis will be ordered.      Darryl D. Prime, MD  Electronically Signed     DDP/MEDQ  D:  06/12/2008  T:  06/12/2008  Job:  [id number]   cc:   Pam Drown, M.D.

## 2010-11-21 LAB — CBC
HCT: 38.1
HCT: 38.1
HCT: 39.2
HCT: 41.5
HCT: 36
HCT: 40.3
Hemoglobin: 12.9
Hemoglobin: 13
Hemoglobin: 13.8
Hemoglobin: 12.1
Hemoglobin: 12.7
Hemoglobin: 12.9
Hemoglobin: 13.7
Hemoglobin: 14.6
Hemoglobin: 11.9 — ABNORMAL LOW
Hemoglobin: 12.7
MCHC: 33
MCHC: 33.3
MCHC: 34.2
MCHC: 33.2
MCHC: 33.5
MCHC: 33.7
MCHC: 33.8
MCHC: 33.9
MCHC: 33.9
MCV: 91.4
MCV: 91.4
MCV: 91.4
MCV: 92.8
MCV: 89.9
MCV: 90.9
MCV: 91.9
MCV: 91.1
Platelets: 249
Platelets: 256
Platelets: 313
Platelets: 227
Platelets: 234
Platelets: 236
Platelets: 295
Platelets: 227
RBC: 4.17
RBC: 4.17
RBC: 4.22
RBC: 4.54
RBC: 4.43
RBC: 4.8
RBC: 3.79 — ABNORMAL LOW
RDW: 13.5
RDW: 13.9
RDW: 13.9
RDW: 13.3
RDW: 13.5
RDW: 13.5
RDW: 13.6
RDW: 13.7
RDW: 13.7
WBC: 14.2 — ABNORMAL HIGH
WBC: 14.4 — ABNORMAL HIGH
WBC: 15 — ABNORMAL HIGH
WBC: 17.5 — ABNORMAL HIGH
WBC: 12.7 — ABNORMAL HIGH
WBC: 14.4 — ABNORMAL HIGH
WBC: 11.9 — ABNORMAL HIGH
WBC: 15.1 — ABNORMAL HIGH

## 2010-11-21 LAB — BASIC METABOLIC PANEL WITH GFR
BUN: 20
BUN: 16
BUN: 24 — ABNORMAL HIGH
CO2: 32
CO2: 33 — ABNORMAL HIGH
CO2: 33 — ABNORMAL HIGH
Calcium: 8.7
Calcium: 9.5
Calcium: 9.5
Chloride: 98
Chloride: 91 — ABNORMAL LOW
Chloride: 93 — ABNORMAL LOW
Creatinine, Ser: 1.02
Creatinine, Ser: 0.95
Creatinine, Ser: 1.01
GFR calc non Af Amer: 51 — ABNORMAL LOW
GFR calc non Af Amer: 51 — ABNORMAL LOW
GFR calc non Af Amer: 55 — ABNORMAL LOW
Glucose, Bld: 143 — ABNORMAL HIGH
Glucose, Bld: 164 — ABNORMAL HIGH
Glucose, Bld: 184 — ABNORMAL HIGH
Potassium: 4.8
Potassium: 3.8
Potassium: 5.2 — ABNORMAL HIGH
Sodium: 138
Sodium: 134 — ABNORMAL LOW
Sodium: 136

## 2010-11-21 LAB — DIFFERENTIAL
Basophils Absolute: 0
Basophils Absolute: 0
Basophils Relative: 0
Basophils Relative: 0
Basophils Relative: 0
Basophils Relative: 1
Basophils Relative: 1
Eosinophils Absolute: 0
Eosinophils Absolute: 0
Eosinophils Absolute: 0
Eosinophils Absolute: 0.1
Eosinophils Absolute: 0.1
Eosinophils Relative: 0
Eosinophils Relative: 0
Eosinophils Relative: 1
Eosinophils Relative: 1
Lymphocytes Relative: 10 — ABNORMAL LOW
Lymphocytes Relative: 16
Lymphs Abs: 1
Lymphs Abs: 1.3
Lymphs Abs: 1.5
Lymphs Abs: 1.9
Monocytes Absolute: 0.2
Monocytes Absolute: 0.7
Monocytes Absolute: 0.7
Monocytes Absolute: 1
Monocytes Absolute: 1.5 — ABNORMAL HIGH
Monocytes Relative: 2 — ABNORMAL LOW
Monocytes Relative: 5
Monocytes Relative: 6
Monocytes Relative: 8
Monocytes Relative: 10
Neutro Abs: 8.9 — ABNORMAL HIGH
Neutro Abs: 8.9 — ABNORMAL HIGH
Neutrophils Relative %: 76
Neutrophils Relative %: 88 — ABNORMAL HIGH

## 2010-11-21 LAB — BASIC METABOLIC PANEL
BUN: 20
BUN: 26 — ABNORMAL HIGH
BUN: 22
BUN: 26 — ABNORMAL HIGH
CO2: 29
CO2: 28
Calcium: 8.4
Calcium: 9
Calcium: 8.8
Chloride: 102
Creatinine, Ser: 0.98
Creatinine, Ser: 0.99
Creatinine, Ser: 0.91
GFR calc Af Amer: >60
GFR calc Af Amer: >60
GFR calc Af Amer: >60
GFR calc non Af Amer: 45 — ABNORMAL LOW
GFR calc non Af Amer: 52 — ABNORMAL LOW
GFR calc non Af Amer: 43 — ABNORMAL LOW
GFR calc non Af Amer: 58 — ABNORMAL LOW
Glucose, Bld: 168 — ABNORMAL HIGH
Glucose, Bld: 172 — ABNORMAL HIGH
Glucose, Bld: 190 — ABNORMAL HIGH
Potassium: 4.5
Potassium: 4.6
Potassium: 4.7
Potassium: 5.1
Sodium: 134 — ABNORMAL LOW
Sodium: 128 — ABNORMAL LOW

## 2010-11-21 LAB — CARDIAC PANEL(CRET KIN+CKTOT+MB+TROPI)
CK, MB: 4.5 — ABNORMAL HIGH
CK, MB: 5 — ABNORMAL HIGH
Relative Index: INVALID
Total CK: 41
Total CK: 47

## 2010-11-21 LAB — POCT I-STAT, CHEM 8
BUN: 21
Calcium, Ion: 1.1 — ABNORMAL LOW
Chloride: 96
Creatinine, Ser: 1.2
Glucose, Bld: 119 — ABNORMAL HIGH
HCT: 40
Hemoglobin: 13.6
Potassium: 3.9
Sodium: 133 — ABNORMAL LOW
TCO2: 34

## 2010-11-21 LAB — COMPREHENSIVE METABOLIC PANEL
ALT: 38 — ABNORMAL HIGH
ALT: 43 — ABNORMAL HIGH
ALT: 47 — ABNORMAL HIGH
AST: 27
AST: 23
Albumin: 2.8 — ABNORMAL LOW
Albumin: 2.9 — ABNORMAL LOW
Alkaline Phosphatase: 46
Alkaline Phosphatase: 39
CO2: 31
Calcium: 8.5
Chloride: 100
Creatinine, Ser: 1.01
GFR calc Af Amer: 55 — ABNORMAL LOW
GFR calc Af Amer: >60
GFR calc non Af Amer: 51 — ABNORMAL LOW
Glucose, Bld: 176 — ABNORMAL HIGH
Potassium: 5
Potassium: 4.7
Sodium: 131 — ABNORMAL LOW
Sodium: 133 — ABNORMAL LOW
Total Bilirubin: 0.6
Total Protein: 5.1 — ABNORMAL LOW
Total Protein: 5.3 — ABNORMAL LOW

## 2010-11-21 LAB — URINALYSIS, ROUTINE W REFLEX MICROSCOPIC
Bilirubin Urine: NEGATIVE
Bilirubin Urine: NEGATIVE
Glucose, UA: NEGATIVE
Hgb urine dipstick: NEGATIVE
Ketones, ur: NEGATIVE
Ketones, ur: NEGATIVE
Nitrite: NEGATIVE
Nitrite: NEGATIVE
Protein, ur: NEGATIVE
Specific Gravity, Urine: 1.015
Urobilinogen, UA: 0.2
Urobilinogen, UA: 0.2
pH: 6.5
pH: 7.5

## 2010-11-21 LAB — POCT CARDIAC MARKERS
Myoglobin, poc: 61.5
Operator id: 264031
Troponin i, poc: <0.05

## 2010-11-21 LAB — HEPATIC FUNCTION PANEL
ALT: 20
Albumin: 3.6
Alkaline Phosphatase: 57
Total Bilirubin: 0.6
Total Protein: 6.5

## 2010-11-21 LAB — STREP A DNA PROBE: Group A Strep Probe: NEGATIVE

## 2010-11-21 LAB — RAPID STREP SCREEN (MED CTR MEBANE ONLY): Streptococcus, Group A Screen (Direct): NEGATIVE

## 2010-11-21 LAB — SODIUM: Sodium: 129 — ABNORMAL LOW

## 2010-11-21 LAB — D-DIMER, QUANTITATIVE

## 2010-11-21 LAB — PROTIME-INR: Prothrombin Time: 13.3

## 2010-11-21 LAB — B-NATRIURETIC PEPTIDE (CONVERTED LAB)
Pro B Natriuretic peptide (BNP): 182 — ABNORMAL HIGH
Pro B Natriuretic peptide (BNP): 264 — ABNORMAL HIGH

## 2010-11-21 LAB — URINE CULTURE: Colony Count: NO GROWTH

## 2011-01-25 DIAGNOSIS — R040 Epistaxis: Secondary | ICD-10-CM

## 2011-01-25 HISTORY — DX: Epistaxis: R04.0

## 2011-02-10 ENCOUNTER — Encounter (HOSPITAL_COMMUNITY): Payer: Self-pay | Admitting: Emergency Medicine

## 2011-02-10 ENCOUNTER — Emergency Department (INDEPENDENT_AMBULATORY_CARE_PROVIDER_SITE_OTHER)
Admission: EM | Admit: 2011-02-10 | Discharge: 2011-02-10 | Disposition: A | Discharge status: Nursing Home (Includes ICF) | Payer: Medicare Other | Source: Home / Self Care

## 2011-02-10 ENCOUNTER — Emergency Department (HOSPITAL_COMMUNITY)
Admission: EM | Admit: 2011-02-10 | Discharge: 2011-02-11 | Disposition: A | Discharge status: Home / Self Care | Payer: Medicare Other | Source: Home / Self Care | Attending: Emergency Medicine | Admitting: Emergency Medicine

## 2011-02-10 DIAGNOSIS — Z79899 Other long term (current) drug therapy: Secondary | ICD-10-CM | POA: Insufficient documentation

## 2011-02-10 DIAGNOSIS — R040 Epistaxis: Secondary | ICD-10-CM

## 2011-02-10 DIAGNOSIS — I4891 Unspecified atrial fibrillation: Secondary | ICD-10-CM | POA: Insufficient documentation

## 2011-02-10 DIAGNOSIS — Z7901 Long term (current) use of anticoagulants: Secondary | ICD-10-CM | POA: Insufficient documentation

## 2011-02-10 DIAGNOSIS — I2510 Atherosclerotic heart disease of native coronary artery without angina pectoris: Secondary | ICD-10-CM | POA: Insufficient documentation

## 2011-02-10 DIAGNOSIS — R5383 Other fatigue: Secondary | ICD-10-CM | POA: Insufficient documentation

## 2011-02-10 DIAGNOSIS — R5381 Other malaise: Secondary | ICD-10-CM | POA: Insufficient documentation

## 2011-02-10 DIAGNOSIS — Z7982 Long term (current) use of aspirin: Secondary | ICD-10-CM | POA: Insufficient documentation

## 2011-02-10 HISTORY — DX: Unspecified atrial fibrillation: I48.91

## 2011-02-10 HISTORY — DX: Atherosclerotic heart disease of native coronary artery without angina pectoris: I25.10

## 2011-02-10 HISTORY — DX: Unspecified atherosclerosis of native arteries of extremities, unspecified extremity: I70.209

## 2011-02-10 LAB — PROTIME-INR
INR: 2.21 — ABNORMAL HIGH (ref 0.00–1.49)
Prothrombin Time: 24.9 seconds — ABNORMAL HIGH (ref 11.6–15.2)

## 2011-02-10 LAB — POCT I-STAT, CHEM 8
BUN: 28 mg/dL — ABNORMAL HIGH (ref 6–23)
Calcium, Ion: 1.16 mmol/L (ref 1.12–1.32)
Chloride: 103 mEq/L (ref 96–112)
Creatinine, Ser: 1.2 mg/dL — ABNORMAL HIGH (ref 0.50–1.10)
Glucose, Bld: 129 mg/dL — ABNORMAL HIGH (ref 70–99)
HCT: 34 % — ABNORMAL LOW (ref 36.0–46.0)
Hemoglobin: 11.6 g/dL — ABNORMAL LOW (ref 12.0–15.0)
Potassium: 4.1 mEq/L (ref 3.5–5.1)
Sodium: 143 mEq/L (ref 135–145)
TCO2: 31 mmol/L (ref 0–100)

## 2011-02-10 MED ORDER — PHENYLEPHRINE HCL 0.5 % NA SOLN
2.0000 [drp] | Freq: Once | NASAL | Status: AC
  Administered 2011-02-10: 2 [drp] via NASAL

## 2011-02-10 MED ORDER — SODIUM CHLORIDE 0.9 % IV BOLUS (SEPSIS)
500.0000 mL | Freq: Once | INTRAVENOUS | Status: AC
  Administered 2011-02-10: 500 mL via INTRAVENOUS

## 2011-02-10 MED ORDER — ALBUTEROL SULFATE HFA 108 (90 BASE) MCG/ACT IN AERS
2.0000 | INHALATION_SPRAY | Freq: Once | RESPIRATORY_TRACT | Status: AC
  Administered 2011-02-10: 2 via RESPIRATORY_TRACT
  Filled 2011-02-10: qty 6.7

## 2011-02-10 MED ORDER — OXYMETAZOLINE HCL 0.05 % NA SOLN
NASAL | Status: AC
  Administered 2011-02-10
  Filled 2011-02-10: qty 15

## 2011-02-10 NOTE — ED Provider Notes (Signed)
History     CSN: [id number] Arrival date & time: 02/10/2011  2:38 PM   None     Chief Complaint  Patient presents with  . Epistaxis    (Consider location/radiation/quality/duration/timing/severity/associated sxs/prior treatment) HPI Comments: Onset of Rt nostril bleeding 2 days ago. Bleeding has been intermittent since and sometimes awakens her during the night. Has been applying pressure and using papertowel in her nose to try to stop the bleeding. She receives O2 via nasal cannula and is also on Warfarin. Her daughter has held her last 2 doses of Coumadin concerned that that is causing the bleeding. She has had some mild bleeding from her nose in the past but not like this. She denies recent nasal congestion or URI symptoms.   Patient is a 77 y.o. female presenting with nosebleeds. The history is provided by the patient and a relative.  Epistaxis  This is a new problem. The current episode started 2 days ago. The problem occurs daily. The problem has not changed since onset.The problem is associated with anticoagulants. The bleeding has been from the right nare. She has tried applying pressure for the symptoms. The treatment provided no relief. Her past medical history does not include colds, sinus problems or frequent nosebleeds.    Past Medical History  Diagnosis Date  . Atrial fibrillation   . Femoral artery occlusion   . Coronary artery disease     History reviewed. No pertinent past surgical history.  History reviewed. No pertinent family history.  History  Substance Use Topics  . Smoking status: Not on file  . Smokeless tobacco: Not on file  . Alcohol Use:     OB History    Grav Para Term Preterm Abortions TAB SAB Ect Mult Living                  Review of Systems  Constitutional: Negative for fever and chills.  HENT: Positive for nosebleeds. Negative for ear pain, congestion, sore throat, rhinorrhea and sinus pressure.   Respiratory: Negative for cough and  shortness of breath.   Cardiovascular: Negative for chest pain.    Allergies  Penicillins and Sulfonamide derivatives  Home Medications  No current outpatient prescriptions on file.  BP 122/49  Pulse 64  Temp(Src) 98.1 F (36.7 C) (Oral)  Resp 18  SpO2 97%  Physical Exam  Nursing note and vitals reviewed. Constitutional: She appears well-developed and well-nourished. No distress.  HENT:  Head: Normocephalic and atraumatic.  Right Ear: Tympanic membrane, external ear and ear canal normal.  Left Ear: Tympanic membrane, external ear and ear canal normal.  Nose: No mucosal edema or rhinorrhea. Epistaxis is observed.    Mouth/Throat: Uvula is midline, oropharynx is clear and moist and mucous membranes are normal. No oropharyngeal exudate, posterior oropharyngeal edema or posterior oropharyngeal erythema.    Neck: Neck supple.  Cardiovascular: Normal rate, regular rhythm and normal heart sounds.   Pulmonary/Chest: Effort normal and breath sounds normal. No respiratory distress.  Lymphadenopathy:    She has no cervical adenopathy.  Neurological: She is alert.  Skin: Skin is warm and dry.  Psychiatric: She has a normal mood and affect.    ED Course  EPISTAXIS MANAGEMENT Date/Time: 02/10/2011 4:06 PM Performed by: Melody Comas Authorized by: Melody Comas Consent: Verbal consent obtained. Written consent not obtained. Risks and benefits: risks, benefits and alternatives were discussed Consent given by: patient Patient understanding: patient states understanding of the procedure being performed Patient consent: the patient's understanding of  the procedure matches consent given Patient identity confirmed: verbally with patient Patient sedated: no Treatment site: right anterior Repair method: nasal balloon Post-procedure assessment: no improvement Patient tolerance: Patient tolerated the procedure well with no immediate complications.   (including critical care  time)  Labs Reviewed  PROTIME-INR - Abnormal; Notable for the following:    Prothrombin Time 24.9 (*)    INR 2.21 (*)    All other components within normal limits   No results found.   1. Epistaxis       MDM  Rt epistaxis. Rhinorocket inserted but pt continued to have bleeding posterior orpharynx. Transferred to [facility] ED for further mgmt.         Melody Comas, [place] 02/10/11 [phone]

## 2011-02-10 NOTE — ED Notes (Signed)
While in room to instill nasal spray, pt began to have another episode of bleeding from right nostril. Direct pressure to lower nose at bony border for ~5 min w brief resolution of bleeding, pressure reapplied , x 5 min , bleeding spontaneously returned; D Garnette Czech aware

## 2011-02-10 NOTE — ED Provider Notes (Signed)
Medical screening examination/treatment/procedure(s) were performed by non-physician practitioner and as supervising physician I was immediately available for consultation/collaboration.  Raynald Blend, MD 02/10/11 [phone]

## 2011-02-10 NOTE — ED Provider Notes (Signed)
History     CSN: [id number] Arrival date & time: 02/10/2011  5:46 PM   First MD Initiated Contact with Patient 02/10/11 2202      Chief Complaint  Patient presents with  . Epistaxis    (Consider location/radiation/quality/duration/timing/severity/associated sxs/prior treatment) The history is provided by the patient and a relative.  Pt with h/o AF (on coumadin) and COPD (on 3L home O2) who is here with epistaxis.  Located in R nare.  Started about 2d ago.  Intermittent but worsening today.  BRB only in R nare.  Mild drainage in throat at times.  No recent trauma.  Improved at home with pressure initially but that is no longer helping.  No bleeding elsewhere.  Mild fatigue today but has not had much to eat or drink since at [facility] and ED most of day.  Seen at [facility] today.  INR 2.2.  Rhino rocket placed.  Pt here bc epistaxis returned with mild oozing around rhino rocket.  No syncope, cp, or change from baseline dyspnea.  Overall moderate severity.   Past Medical History  Diagnosis Date  . Atrial fibrillation   . Femoral artery occlusion   . Coronary artery disease     History reviewed. No pertinent past surgical history.  History reviewed. No pertinent family history.  History  Substance Use Topics  . Smoking status: Not on file  . Smokeless tobacco: Not on file  . Alcohol Use:     OB History    Grav Para Term Preterm Abortions TAB SAB Ect Mult Living                  Review of Systems  Constitutional: Negative for fever and chills.  HENT: Negative for facial swelling.   Eyes: Negative for visual disturbance.  Respiratory: Negative for cough, chest tightness and shortness of breath.   Cardiovascular: Negative for chest pain.  Gastrointestinal: Negative for nausea, vomiting, abdominal pain and diarrhea.  Genitourinary: Negative for difficulty urinating.  Skin: Negative for rash.  Neurological: Negative for weakness and numbness.  Psychiatric/Behavioral: Negative for  behavioral problems and confusion.  All other systems reviewed and are negative.    Allergies  Penicillins and Sulfonamide derivatives  Home Medications   Current Outpatient Rx  Name Route Sig Dispense Refill  . ASPIRIN 81 MG PO TABS Oral Take 81 mg by mouth every evening.     [name] Kitchen BETHANECHOL CHLORIDE 10 MG PO TABS Oral Take 10 mg by mouth 3 (three) times daily.      [name] Kitchen DIGOXIN 0.25 MG PO TABS Oral Take 0.125 mcg by mouth every morning.     [name] Kitchen DILTIAZEM HCL ER COATED BEADS 120 MG PO TB24 Oral Take 120 mg by mouth every morning.      [name] Kitchen ESOMEPRAZOLE MAGNESIUM 40 MG PO CPDR Oral Take 40 mg by mouth daily before breakfast.      . FUROSEMIDE 40 MG PO TABS Oral Take 40 mg by mouth every morning.     . WARFARIN SODIUM 2 MG PO TABS Oral Take 2 mg by mouth every evening.     [name] Kitchen DOXYCYCLINE HYCLATE 50 MG PO CAPS Oral Take 2 capsules (100 mg total) by mouth 2 (two) times daily. 10 capsule 0    BP 145/60  Pulse 64  Temp(Src) 97.4 F (36.3 C) (Oral)  Resp 20  Ht 5\' 2"  (1.575 m)  Wt 150 lb (68.04 kg)  BMI 27.44 kg/m2  SpO2 94%  Physical Exam  Nursing note and vitals  reviewed. Constitutional: She is oriented to person, place, and time. She appears well-developed and well-nourished. No distress.  HENT:  Head: Normocephalic and atraumatic.       R Nare:  Rhino rocket in place but falling out.  It was removed.  No active bleeding.  Nose blown an no sig clot burden.  Still no bleeding.  Dried blood at Kiesselbach's plexus.   L Nare: no bleeding  Eyes: EOM are normal.  Neck: Normal range of motion. No JVD present.  Cardiovascular: Normal rate, regular rhythm and intact distal pulses.   Pulmonary/Chest: Effort normal. No respiratory distress.       Good air movement throughout Mild intermittent scattered wheezes  Abdominal: Soft. There is no tenderness.       No visible injury  Musculoskeletal: Normal range of motion.       No pain with range of motion and no visible injury  Neurological: She  is alert and oriented to person, place, and time. No cranial nerve deficit. Coordination normal.  Skin: Skin is warm and dry. She is not diaphoretic.  Psychiatric: She has a normal mood and affect. Her behavior is normal. Thought content normal.    ED Course  EPISTAXIS MANAGEMENT Date/Time: 02/10/2011 4:40 AM Performed by: Milus Glazier Authorized by: Milus Glazier Consent: Verbal consent obtained. Risks and benefits: risks, benefits and alternatives were discussed Consent given by: patient Patient identity confirmed: verbally with patient, arm band and provided demographic data Patient sedated: no Treatment site: right anterior Repair method: anterior pack Post-procedure assessment: bleeding stopped Treatment complexity: complex Recurrence: recurrence of recent bleed Patient tolerance: Patient tolerated the procedure well with no immediate complications.   (including critical care time)  Labs Reviewed  POCT I-STAT, CHEM 8 - Abnormal; Notable for the following:    BUN 28 (*)    Creatinine, Ser 1.20 (*)    Glucose, Bld 129 (*)    Hemoglobin 11.6 (*)    HCT 34.0 (*)    All other components within normal limits   No results found.   1. Epistaxis       MDM   Pt with epistaxis.  Exam reveals R anterior epistaxis with evidence of recent bleeding but hemostatic at time of exam.  INR 2.2.  Recommend holding coumadin a couple days.  Discussed increased stroke risk, but they are in agreement that managing acute epistaxis is primary concern.  With recurrent nature of bleed, rhino rocket placed.  Doxy for packing ppx (pcn and sulfa allergy).  istat obtained, which shows nml hgb.  Pt's Cr is mildly inc from Cr in 2010.  This may be somewhat chronic, but with dec po intake today, likely prerenal increase.  500cc IVF given and pt feels better overall.  Will have her f/u with her pcp regarding this and for recheck.   Pt given albuterol in ED bc she was due for home dose and did not  have inhaler with her.  NO respiratory complaints.  Instructed family on f/u and packing removal w/in 5d.  At time of dc, pt well appearing.        Milus Glazier 02/11/11 [phone]

## 2011-02-10 NOTE — ED Notes (Signed)
Family has brought pt in to have right sided nosebleed evaluated; nosebleed reportedly started yesterday, and family omitted dose of warfarin today and yesterday; no active bleeding at present

## 2011-02-10 NOTE — ED Notes (Signed)
Pt from [facility] for eval for nosebleed to right nare; rhino rocket intact at present and no bleeding noted at present

## 2011-02-11 MED ORDER — DOXYCYCLINE HYCLATE 50 MG PO CAPS: 100.0000 mg | ORAL_CAPSULE | Freq: Two times a day (BID) | ORAL | Status: DC

## 2011-02-11 NOTE — ED Provider Notes (Signed)
I saw and evaluated the patient, reviewed the resident's note and I agree with the findings and plan.  Onie Hayashi, MD 02/11/11 0916 

## 2011-02-14 ENCOUNTER — Emergency Department (HOSPITAL_COMMUNITY): Payer: Medicare Other

## 2011-02-14 ENCOUNTER — Other Ambulatory Visit (HOSPITAL_COMMUNITY): Payer: Medicare Other

## 2011-02-14 ENCOUNTER — Inpatient Hospital Stay (HOSPITAL_COMMUNITY)
Admission: EM | Admit: 2011-02-14 | Discharge: 2011-02-20 | DRG: 189 | Disposition: A | Discharge status: Home Health | Payer: Medicare Other | Attending: Internal Medicine | Admitting: Internal Medicine

## 2011-02-14 ENCOUNTER — Encounter (HOSPITAL_COMMUNITY): Payer: Self-pay | Admitting: *Deleted

## 2011-02-14 DIAGNOSIS — R0902 Hypoxemia: Secondary | ICD-10-CM

## 2011-02-14 DIAGNOSIS — J441 Chronic obstructive pulmonary disease with (acute) exacerbation: Secondary | ICD-10-CM | POA: Diagnosis present

## 2011-02-14 DIAGNOSIS — R0600 Dyspnea, unspecified: Secondary | ICD-10-CM

## 2011-02-14 DIAGNOSIS — A4902 Methicillin resistant Staphylococcus aureus infection, unspecified site: Secondary | ICD-10-CM | POA: Diagnosis present

## 2011-02-14 DIAGNOSIS — R1310 Dysphagia, unspecified: Secondary | ICD-10-CM | POA: Diagnosis present

## 2011-02-14 DIAGNOSIS — I4891 Unspecified atrial fibrillation: Secondary | ICD-10-CM

## 2011-02-14 DIAGNOSIS — J9620 Acute and chronic respiratory failure, unspecified whether with hypoxia or hypercapnia: Principal | ICD-10-CM | POA: Diagnosis present

## 2011-02-14 DIAGNOSIS — R6889 Other general symptoms and signs: Secondary | ICD-10-CM | POA: Diagnosis present

## 2011-02-14 DIAGNOSIS — Z950 Presence of cardiac pacemaker: Secondary | ICD-10-CM

## 2011-02-14 DIAGNOSIS — R5381 Other malaise: Secondary | ICD-10-CM | POA: Diagnosis present

## 2011-02-14 DIAGNOSIS — B370 Candidal stomatitis: Secondary | ICD-10-CM | POA: Diagnosis present

## 2011-02-14 DIAGNOSIS — J449 Chronic obstructive pulmonary disease, unspecified: Secondary | ICD-10-CM | POA: Diagnosis present

## 2011-02-14 DIAGNOSIS — R339 Retention of urine, unspecified: Secondary | ICD-10-CM | POA: Diagnosis present

## 2011-02-14 DIAGNOSIS — K219 Gastro-esophageal reflux disease without esophagitis: Secondary | ICD-10-CM | POA: Diagnosis present

## 2011-02-14 DIAGNOSIS — J1100 Influenza due to unidentified influenza virus with unspecified type of pneumonia: Secondary | ICD-10-CM | POA: Diagnosis present

## 2011-02-14 DIAGNOSIS — J9610 Chronic respiratory failure, unspecified whether with hypoxia or hypercapnia: Secondary | ICD-10-CM | POA: Diagnosis present

## 2011-02-14 DIAGNOSIS — K5900 Constipation, unspecified: Secondary | ICD-10-CM | POA: Diagnosis present

## 2011-02-14 DIAGNOSIS — R0602 Shortness of breath: Secondary | ICD-10-CM

## 2011-02-14 HISTORY — DX: Spondylosis, unspecified: M47.9

## 2011-02-14 HISTORY — DX: Chronic obstructive pulmonary disease, unspecified: J44.9

## 2011-02-14 HISTORY — DX: Unspecified osteoarthritis, unspecified site: M19.90

## 2011-02-14 HISTORY — DX: Pneumonia, unspecified organism: J18.9

## 2011-02-14 HISTORY — DX: Epistaxis: R04.0

## 2011-02-14 LAB — CARDIAC PANEL(CRET KIN+CKTOT+MB+TROPI)
CK, MB: 3.2 ng/mL (ref 0.3–4.0)
Relative Index: 1.5 (ref 0.0–2.5)
Relative Index: 1.5 (ref 0.0–2.5)
Total CK: 211 U/L — ABNORMAL HIGH (ref 7–177)
Troponin I: <0.3 ng/mL (ref <0.30)
Troponin I: <0.3 ng/mL (ref <0.30)

## 2011-02-14 LAB — URINALYSIS, ROUTINE W REFLEX MICROSCOPIC
Glucose, UA: NEGATIVE mg/dL
Hgb urine dipstick: NEGATIVE
Leukocytes, UA: NEGATIVE
Specific Gravity, Urine: 1.018 (ref 1.005–1.030)
pH: 5 (ref 5.0–8.0)

## 2011-02-14 LAB — COMPREHENSIVE METABOLIC PANEL
AST: 40 U/L — ABNORMAL HIGH (ref 0–37)
Albumin: 3.2 g/dL — ABNORMAL LOW (ref 3.5–5.2)
Chloride: 94 mEq/L — ABNORMAL LOW (ref 96–112)
Creatinine, Ser: 1.19 mg/dL — ABNORMAL HIGH (ref 0.50–1.10)
Potassium: 4.5 mEq/L (ref 3.5–5.1)
Total Bilirubin: 0.4 mg/dL (ref 0.3–1.2)
Total Protein: 6.6 g/dL (ref 6.0–8.3)

## 2011-02-14 LAB — DIFFERENTIAL
Basophils Absolute: 0 10*3/uL (ref 0.0–0.1)
Basophils Relative: 0 % (ref 0–1)
Monocytes Absolute: 0.6 10*3/uL (ref 0.1–1.0)
Neutro Abs: 7.7 10*3/uL (ref 1.7–7.7)
Neutrophils Relative %: 83 % — ABNORMAL HIGH (ref 43–77)

## 2011-02-14 LAB — BLOOD GAS, ARTERIAL
Acid-Base Excess: 5.1 mmol/L — ABNORMAL HIGH (ref 0.0–2.0)
Bicarbonate: 29.2 mEq/L — ABNORMAL HIGH (ref 20.0–24.0)
O2 Saturation: 91.8 %
pCO2 arterial: 42.7 mmHg (ref 35.0–45.0)
pO2, Arterial: 62.2 mmHg — ABNORMAL LOW (ref 80.0–100.0)

## 2011-02-14 LAB — PROCALCITONIN: Procalcitonin: <0.1 ng/mL

## 2011-02-14 LAB — DIGOXIN LEVEL: Digoxin Level: 0.6 ng/mL — ABNORMAL LOW (ref 0.8–2.0)

## 2011-02-14 LAB — CBC
MCHC: 31.9 g/dL (ref 30.0–36.0)
RDW: 14.6 % (ref 11.5–15.5)

## 2011-02-14 LAB — D-DIMER, QUANTITATIVE: D-Dimer, Quant: 1.27 ug/mL-FEU — ABNORMAL HIGH (ref 0.00–0.48)

## 2011-02-14 LAB — PRO B NATRIURETIC PEPTIDE: Pro B Natriuretic peptide (BNP): 6326 pg/mL — ABNORMAL HIGH (ref 0–450)

## 2011-02-14 MED ORDER — ONDANSETRON HCL 4 MG PO TABS: 4.0000 mg | ORAL_TABLET | Freq: Four times a day (QID) | ORAL | Status: DC | PRN

## 2011-02-14 MED ORDER — ASPIRIN EC 81 MG PO TBEC
81.0000 mg | DELAYED_RELEASE_TABLET | Freq: Every evening | ORAL | Status: DC
  Administered 2011-02-14 – 2011-02-19 (×5): 81 mg via ORAL
  Filled 2011-02-14 (×7): qty 1

## 2011-02-14 MED ORDER — SODIUM CHLORIDE 0.9 % IV SOLN
Freq: Once | INTRAVENOUS | Status: AC
  Administered 2011-02-14: 10:00:00 via INTRAVENOUS

## 2011-02-14 MED ORDER — STARCH (THICKENING) PO POWD
ORAL | Status: DC | PRN
  Filled 2011-02-14 (×2): qty 1

## 2011-02-14 MED ORDER — SODIUM CHLORIDE 0.9 % IJ SOLN
3.0000 mL | Freq: Two times a day (BID) | INTRAMUSCULAR | Status: DC
  Administered 2011-02-14 – 2011-02-19 (×9): 3 mL via INTRAVENOUS

## 2011-02-14 MED ORDER — ACETAMINOPHEN 500 MG PO TABS
ORAL_TABLET | ORAL | Status: AC
  Administered 2011-02-14: 1000 mg via ORAL
  Filled 2011-02-14: qty 2

## 2011-02-14 MED ORDER — ONDANSETRON HCL 4 MG/2ML IJ SOLN
4.0000 mg | Freq: Four times a day (QID) | INTRAMUSCULAR | Status: DC | PRN
  Administered 2011-02-15: 4 mg via INTRAVENOUS
  Filled 2011-02-14: qty 2

## 2011-02-14 MED ORDER — MOXIFLOXACIN HCL IN NACL 400 MG/250ML IV SOLN
400.0000 mg | Freq: Once | INTRAVENOUS | Status: AC
  Administered 2011-02-14: 400 mg via INTRAVENOUS
  Filled 2011-02-14: qty 250

## 2011-02-14 MED ORDER — ALBUTEROL SULFATE (5 MG/ML) 0.5% IN NEBU
5.0000 mg | INHALATION_SOLUTION | Freq: Once | RESPIRATORY_TRACT | Status: AC
  Administered 2011-02-14: 5 mg via RESPIRATORY_TRACT
  Filled 2011-02-14: qty 1

## 2011-02-14 MED ORDER — SODIUM CHLORIDE 0.9 % IJ SOLN
3.0000 mL | INTRAMUSCULAR | Status: DC | PRN
  Administered 2011-02-16: 3 mL via INTRAVENOUS

## 2011-02-14 MED ORDER — ACETAMINOPHEN 650 MG RE SUPP: 650.0000 mg | Freq: Four times a day (QID) | RECTAL | Status: DC | PRN

## 2011-02-14 MED ORDER — PANTOPRAZOLE SODIUM 40 MG PO TBEC
80.0000 mg | DELAYED_RELEASE_TABLET | Freq: Every day | ORAL | Status: DC
  Administered 2011-02-14 – 2011-02-20 (×6): 80 mg via ORAL
  Filled 2011-02-14: qty 2
  Filled 2011-02-14: qty 1
  Filled 2011-02-14 (×4): qty 2
  Filled 2011-02-14: qty 1
  Filled 2011-02-14: qty 2

## 2011-02-14 MED ORDER — CLINDAMYCIN PHOSPHATE 600 MG/50ML IV SOLN
600.0000 mg | Freq: Three times a day (TID) | INTRAVENOUS | Status: DC
  Administered 2011-02-14 – 2011-02-17 (×8): 600 mg via INTRAVENOUS
  Filled 2011-02-14 (×14): qty 50

## 2011-02-14 MED ORDER — OXYCODONE HCL 5 MG PO TABS
5.0000 mg | ORAL_TABLET | ORAL | Status: DC | PRN
  Administered 2011-02-15 – 2011-02-19 (×7): 5 mg via ORAL
  Filled 2011-02-14 (×7): qty 1

## 2011-02-14 MED ORDER — ALBUTEROL SULFATE (5 MG/ML) 0.5% IN NEBU
2.5000 mg | INHALATION_SOLUTION | Freq: Four times a day (QID) | RESPIRATORY_TRACT | Status: DC
  Administered 2011-02-14 – 2011-02-16 (×10): 2.5 mg via RESPIRATORY_TRACT
  Filled 2011-02-14 (×12): qty 0.5

## 2011-02-14 MED ORDER — METHYLPREDNISOLONE SODIUM SUCC 125 MG IJ SOLR
125.0000 mg | Freq: Once | INTRAMUSCULAR | Status: AC
  Administered 2011-02-14: 125 mg via INTRAVENOUS
  Filled 2011-02-14: qty 2

## 2011-02-14 MED ORDER — ACETAMINOPHEN 325 MG PO TABS
650.0000 mg | ORAL_TABLET | Freq: Four times a day (QID) | ORAL | Status: DC | PRN
  Administered 2011-02-15 – 2011-02-19 (×2): 650 mg via ORAL
  Filled 2011-02-14 (×2): qty 2

## 2011-02-14 MED ORDER — CALCIUM CARBONATE-VITAMIN D 500-500 MG-UNIT PO CHEW
1.0000 | CHEWABLE_TABLET | Freq: Two times a day (BID) | ORAL | Status: DC
  Filled 2011-02-14: qty 1

## 2011-02-14 MED ORDER — CALCIUM CARBONATE ANTACID 500 MG PO CHEW
1.0000 | CHEWABLE_TABLET | Freq: Two times a day (BID) | ORAL | Status: DC
  Administered 2011-02-15 – 2011-02-20 (×10): 200 mg via ORAL
  Filled 2011-02-14 (×12): qty 1

## 2011-02-14 MED ORDER — SODIUM CHLORIDE 0.9 % IV SOLN
250.0000 mL | INTRAVENOUS | Status: DC | PRN
  Administered 2011-02-14: 250 mL via INTRAVENOUS

## 2011-02-14 MED ORDER — SENNOSIDES-DOCUSATE SODIUM 8.6-50 MG PO TABS
1.0000 | ORAL_TABLET | Freq: Every evening | ORAL | Status: DC | PRN
  Filled 2011-02-14 (×2): qty 1

## 2011-02-14 MED ORDER — DOCUSATE SODIUM 100 MG PO CAPS
100.0000 mg | ORAL_CAPSULE | Freq: Two times a day (BID) | ORAL | Status: DC
  Filled 2011-02-14 (×4): qty 1

## 2011-02-14 MED ORDER — IPRATROPIUM BROMIDE 0.02 % IN SOLN
0.5000 mg | Freq: Once | RESPIRATORY_TRACT | Status: AC
  Administered 2011-02-14: 0.5 mg via RESPIRATORY_TRACT
  Filled 2011-02-14: qty 2.5

## 2011-02-14 MED ORDER — METHYLPREDNISOLONE SODIUM SUCC 125 MG IJ SOLR
80.0000 mg | Freq: Three times a day (TID) | INTRAMUSCULAR | Status: DC
  Administered 2011-02-14 – 2011-02-16 (×6): 80 mg via INTRAVENOUS
  Filled 2011-02-14 (×8): qty 2

## 2011-02-14 MED ORDER — ALBUTEROL SULFATE (5 MG/ML) 0.5% IN NEBU
2.5000 mg | INHALATION_SOLUTION | RESPIRATORY_TRACT | Status: DC | PRN
  Filled 2011-02-14 (×2): qty 0.5

## 2011-02-14 MED ORDER — DIGOXIN 125 MCG PO TABS
0.1250 mg | ORAL_TABLET | Freq: Every day | ORAL | Status: DC
  Administered 2011-02-15 – 2011-02-20 (×6): 0.125 mg via ORAL
  Filled 2011-02-14 (×6): qty 1

## 2011-02-14 MED ORDER — IPRATROPIUM BROMIDE 0.02 % IN SOLN
0.5000 mg | Freq: Four times a day (QID) | RESPIRATORY_TRACT | Status: DC
  Administered 2011-02-14 – 2011-02-16 (×10): 0.5 mg via RESPIRATORY_TRACT
  Filled 2011-02-14 (×12): qty 2.5

## 2011-02-14 MED ORDER — WARFARIN SODIUM 2 MG PO TABS
2.0000 mg | ORAL_TABLET | Freq: Once | ORAL | Status: AC
  Administered 2011-02-14: 2 mg via ORAL
  Filled 2011-02-14: qty 1

## 2011-02-14 MED ORDER — CALCIUM CARBONATE-VITAMIN D 600-400 MG-UNIT PO CHEW
1.0000 | CHEWABLE_TABLET | Freq: Two times a day (BID) | ORAL | Status: DC
  Filled 2011-02-14 (×3): qty 1

## 2011-02-14 MED ORDER — ACETAMINOPHEN 500 MG PO TABS
1000.0000 mg | ORAL_TABLET | Freq: Once | ORAL | Status: AC
  Administered 2011-02-14: 1000 mg via ORAL

## 2011-02-14 MED ORDER — GUAIFENESIN ER 600 MG PO TB12
600.0000 mg | ORAL_TABLET | Freq: Two times a day (BID) | ORAL | Status: DC
  Administered 2011-02-14 – 2011-02-15 (×3): 600 mg via ORAL
  Filled 2011-02-14 (×4): qty 1

## 2011-02-14 MED ORDER — DILTIAZEM HCL ER 240 MG PO CP24
240.0000 mg | ORAL_CAPSULE | Freq: Every day | ORAL | Status: DC
  Administered 2011-02-14 – 2011-02-20 (×7): 240 mg via ORAL
  Filled 2011-02-14 (×7): qty 1

## 2011-02-14 MED ORDER — DIGOXIN 250 MCG PO TABS: 0.1250 ug | ORAL_TABLET | ORAL | Status: DC

## 2011-02-14 MED ORDER — VITAMIN D 1000 UNITS PO TABS
2000.0000 [IU] | ORAL_TABLET | Freq: Every day | ORAL | Status: DC
  Administered 2011-02-14 – 2011-02-20 (×7): 2000 [IU] via ORAL
  Filled 2011-02-14 (×7): qty 2

## 2011-02-14 NOTE — H&P (Signed)
PCP:   MCNEILL,WENDY, MD, MD   Chief Complaint:  Shortness of breath and cough  HPI: Patient is a pleasant and sharp 77 year old white woman who has a past medical history significant for atrial fibrillation maintained on chronic anticoagulation with Coumadin, she is status post permanent pacemaker implantation for tachybradycardia syndrome, also has a history of chronic respiratory failure secondary to COPD and is maintained chronically on 3 L of oxygen continuously at home, history of aspiration pneumonia in the past secondary to dysphagia and has to use thickened liquids at home.she was in the hospital a few days ago for epistaxis that has now resolved after placement of a rhino rocket. Daughter says that yesterday morning she woke up with a dry hacking cough and started to develop fevers up to 101.9. They called her primary care physician who told her to come into the [facility] walk-in clinic, they performed a chest x-ray, of which the patient and her daughter do not no results of, they did a flu test and was told that it was negative, they gave her a prescription for Levaquin and sent her home. Patient's daughter states that overnight she became extremely short of breath to the point were EMS had to be called out. Upon EMS arrival she was found to be 80% on her 3 L of oxygen and was immediately brought into the hospital. We are now asked to admit her for further evaluation and management.   Allergies:   Allergies  Allergen Reactions  . Sulfonamide Derivatives   . Penicillins Itching and Rash      Past Medical History  Diagnosis Date  . Femoral artery occlusion   . Coronary artery disease   . COPD (chronic obstructive pulmonary disease)   . Atrial fibrillation   . Dysphagia     pills have to be crushed and given with applesauce  . Arthritis   . Degenerative arthritis of spine   . Pneumonia     hx aspiraton pna  . Bleeding nose Dec. 2012    Past Surgical History  Procedure Date  .  Pacemaker insertion   . Abdominal hysterectomy   . Appendectomy   . Stomach surgery 1940's    stomach surgery due to gangrene  . Cholecystectomy     Prior to Admission medications   Medication Sig Start Date End Date Taking? Authorizing Provider  aspirin 81 MG tablet Take 81 mg by mouth every evening.     Historical Provider, MD  bethanechol (URECHOLINE) 10 MG tablet Take 10 mg by mouth 3 (three) times daily.      Historical Provider, MD  digoxin (LANOXIN) 0.25 MG tablet Take 0.125 mcg by mouth every morning.     Historical Provider, MD  diltiazem (CARDIZEM LA) 120 MG 24 hr tablet Take 120 mg by mouth every morning.      Historical Provider, MD  doxycycline (VIBRAMYCIN) 50 MG capsule Take 2 capsules (100 mg total) by mouth 2 (two) times daily. 02/11/11 02/15/11  Kathlene November Schinlever  esomeprazole (NEXIUM) 40 MG capsule Take 40 mg by mouth daily before breakfast.      Historical Provider, MD  furosemide (LASIX) 40 MG tablet Take 40 mg by mouth every morning.     Historical Provider, MD  warfarin (COUMADIN) 2 MG tablet Take 2 mg by mouth every evening.     Historical Provider, MD    Social History:  reports that she quit smoking about 22 years ago. Her smoking use included Cigarettes. She has never used smokeless  tobacco. She reports that she does not drink alcohol or use illicit drugs.  History reviewed. No pertinent family history.  Review of Systems:   negative except as mentioned in history of present illness.   Physical Exam: Blood pressure 110/36, pulse 60, temperature 101.7 F (38.7 C), temperature source Other (Comment), resp. rate 21, SpO2 97.00%.  general: Alert, awake, oriented x3, only able to speak in very short sentences secondary to shortness of breath. HEENT: Normocephalic, atraumatic, pupils equal round and reactive to light, intact extraocular movements, moist mucous membranes. Neck: Supple, no JVD, no lymphadenopathy, no bruits, no goiter. Cardiovascular: Irregular rate  and rhythm, I cannot auscultate any murmurs, rubs or gallops. Lungs: Bilateral expiratory wheezes. No rhonchi or crackles. Abdomen: Soft, nontender, nondistended, positive bowel sounds, no masses or organomegaly noted. Extremities: No clubbing, cyanosis or edema, positive pedal pulses, she does have some skin changes to both of her shins that appear to be changes of chronic venous insufficiency. Neurologic: Grossly intact and nonfocal, I have not ambulated her.  Labs on Admission:  Results for orders placed during the hospital encounter of 02/14/11 (from the past 48 hour(s))  CBC     Status: Abnormal   Collection Time   02/14/11  9:30 AM      Component Value Range Comment   WBC 9.3  4.0 - 10.5 (K/uL)    RBC 3.52 (*) 3.87 - 5.11 (MIL/uL)    Hemoglobin 10.3 (*) 12.0 - 15.0 (g/dL)    HCT 16.1 (*) 09.6 - 46.0 (%)    MCV 91.8  78.0 - 100.0 (fL)    MCH 29.3  26.0 - 34.0 (pg)    MCHC 31.9  30.0 - 36.0 (g/dL)    RDW 04.5  40.9 - 81.1 (%)    Platelets 173  150 - 400 (K/uL)   DIFFERENTIAL     Status: Abnormal   Collection Time   02/14/11  9:30 AM      Component Value Range Comment   Neutrophils Relative 83 (*) 43 - 77 (%)    Neutro Abs 7.7  1.7 - 7.7 (K/uL)    Lymphocytes Relative 11 (*) 12 - 46 (%)    Lymphs Abs 1.0  0.7 - 4.0 (K/uL)    Monocytes Relative 7  3 - 12 (%)    Monocytes Absolute 0.6  0.1 - 1.0 (K/uL)    Eosinophils Relative 0  0 - 5 (%)    Eosinophils Absolute 0.0  0.0 - 0.7 (K/uL)    Basophils Relative 0  0 - 1 (%)    Basophils Absolute 0.0  0.0 - 0.1 (K/uL)   COMPREHENSIVE METABOLIC PANEL     Status: Abnormal   Collection Time   02/14/11  9:30 AM      Component Value Range Comment   Sodium 135  135 - 145 (mEq/L)    Potassium 4.5  3.5 - 5.1 (mEq/L)    Chloride 94 (*) 96 - 112 (mEq/L)    CO2 28  19 - 32 (mEq/L)    Glucose, Bld 112 (*) 70 - 99 (mg/dL)    BUN 26 (*) 6 - 23 (mg/dL)    Creatinine, Ser 9.14 (*) 0.50 - 1.10 (mg/dL)    Calcium 9.2  8.4 - 10.5 (mg/dL)     Total Protein 6.6  6.0 - 8.3 (g/dL)    Albumin 3.2 (*) 3.5 - 5.2 (g/dL)    AST 40 (*) 0 - 37 (U/L) SLIGHT HEMOLYSIS   ALT 20  0 -  35 (U/L)    Alkaline Phosphatase 80  39 - 117 (U/L)    Total Bilirubin 0.4  0.3 - 1.2 (mg/dL)    GFR calc non Af Amer 38 (*) >90 (mL/min)    GFR calc Af Amer 44 (*) >90 (mL/min)   LACTIC ACID, PLASMA     Status: Abnormal   Collection Time   02/14/11  9:30 AM      Component Value Range Comment   Lactic Acid, Venous 2.5 (*) 0.5 - 2.2 (mmol/L)   CARDIAC PANEL(CRET KIN+CKTOT+MB+TROPI)     Status: Abnormal   Collection Time   02/14/11  9:30 AM      Component Value Range Comment   Total CK 211 (*) 7 - 177 (U/L)    CK, MB 3.2  0.3 - 4.0 (ng/mL)    Troponin I <0.30  <0.30 (ng/mL)    Relative Index 1.5  0.0 - 2.5    PROCALCITONIN     Status: Normal   Collection Time   02/14/11  9:30 AM      Component Value Range Comment   Procalcitonin <0.10     PROTIME-INR     Status: Abnormal   Collection Time   02/14/11  9:30 AM      Component Value Range Comment   Prothrombin Time 25.4 (*) 11.6 - 15.2 (seconds)    INR 2.27 (*) 0.00 - 1.49    DIGOXIN LEVEL     Status: Abnormal   Collection Time   02/14/11  9:30 AM      Component Value Range Comment   Digoxin Level 0.6 (*) 0.8 - 2.0 (ng/mL)   D-DIMER, QUANTITATIVE     Status: Abnormal   Collection Time   02/14/11  9:30 AM      Component Value Range Comment   D-Dimer, Quant 1.27 (*) 0.00 - 0.48 (ug/mL-FEU)   PRO B NATRIURETIC PEPTIDE     Status: Abnormal   Collection Time   02/14/11  9:30 AM      Component Value Range Comment   Pro B Natriuretic peptide (BNP) 6326.0 (*) 0 - 450 (pg/mL)   URINALYSIS, ROUTINE W REFLEX MICROSCOPIC     Status: Normal   Collection Time   02/14/11  9:44 AM      Component Value Range Comment   Color, Urine YELLOW  YELLOW     APPearance CLEAR  CLEAR     Specific Gravity, Urine 1.018  1.005 - 1.030     pH 5.0  5.0 - 8.0     Glucose, UA NEGATIVE  NEGATIVE (mg/dL)    Hgb urine dipstick  NEGATIVE  NEGATIVE     Bilirubin Urine NEGATIVE  NEGATIVE     Ketones, ur NEGATIVE  NEGATIVE (mg/dL)    Protein, ur NEGATIVE  NEGATIVE (mg/dL)    Urobilinogen, UA 0.2  0.0 - 1.0 (mg/dL)    Nitrite NEGATIVE  NEGATIVE     Leukocytes, UA NEGATIVE  NEGATIVE  MICROSCOPIC NOT DONE ON URINES WITH NEGATIVE PROTEIN, BLOOD, LEUKOCYTES, NITRITE, OR GLUCOSE <1000 mg/dL.  BLOOD GAS, ARTERIAL     Status: Abnormal   Collection Time   02/14/11  9:50 AM      Component Value Range Comment   O2 Content 4.0      Delivery systems NASAL CANNULA      pH, Arterial 7.449 (*) 7.350 - 7.400     pCO2 arterial 42.7  35.0 - 45.0 (mmHg)    pO2, Arterial 62.2 (*) 80.0 - 100.0 (  mmHg)    Bicarbonate 29.2 (*) 20.0 - 24.0 (mEq/L)    TCO2 26.6  0 - 100 (mmol/L)    Acid-Base Excess 5.1 (*) 0.0 - 2.0 (mmol/L)    O2 Saturation 91.8      Patient temperature 98.6      Collection site RIGHT RADIAL      Drawn by [phone]      Sample type ARTERIAL DRAW      Allens test (pass/fail) PASS  PASS      Radiological Exams on Admission: Dg Chest Portable 1 View  02/14/2011  *RADIOLOGY REPORT*  Clinical Data: Shortness of breath, cough, fever and weakness, former smoker  PORTABLE CHEST - 1 VIEW  Comparison: Chest x-ray of 02/13/2011  Findings: There is cardiomegaly present with mild pulmonary vascular congestion.  No focal infiltrate is seen.  A single lead permanent pacemaker is present.  No acute bony abnormality is noted.  IMPRESSION: Cardiomegaly and mild pulmonary vascular congestion with basilar atelectasis.  Original Report Authenticated By: Juline Patch, M.D.    Assessment/Plan Principal Problem:  *Respiratory failure, acute-on-chronic Active Problems:  Atrial fibrillation  C O P D  DYSPNEA  Flu-like symptoms  Dysphagia   #1 acute on chronic respiratory failure: Broad differential diagnosis at this point. To consider a respiratory viral illness, possible aspiration pneumonia given recent epistaxis, acute coronary  syndrome, CHF exacerbation especially given her elevated BNP of greater than 6000, also possibility for PE given elevated d-dimer however less likely. For now we will admit to a telemetry unit, will cycle cardiac enzymes, EKG is paced and unchanged, we'll order a 2-D echocardiogram as last 2-D echo was in 2009 which showed an ejection fraction of 60% but does not make any mention for diastolic dysfunction, I think the possibility of aspiration pneumonia is high given her recent epistaxis so go ahead and start her on treatment for this with clindamycin. There is also a possibility of a COPD exacerbation especially giving her wheezes, so I will place her on frequent nebulizations and steroids. We'll have speech therapy evaluate her swallowing function once more. ED physician has ordered a CT angio to rule out PE which is pending at this time.  #2 atrial fibrillation: She is maintained on chronic anticoagulation with Coumadin. Her INR is currently therapeutic. Will ask pharmacy to dose her Coumadin.  #3 dysphagia: We'll continue thickened liquids pending speech therapy evaluation. Daughter is requesting that no one feed her mother accept her or her sister-in-law.  #4 DVT prophylaxis: She is already anticoagulated on Coumadin.  Time Spent on Admission:  75 minutes   HERNANDEZ ACOSTA,ESTELA [facility] Pager: [phone] 02/14/2011, 1:08 PM

## 2011-02-14 NOTE — Progress Notes (Signed)
ANTICOAGULATION CONSULT NOTE - Initial Consult  Pharmacy Consult for Coumadin Indication: atrial fibrillation  Allergies  Allergen Reactions  . Sulfonamide Derivatives Hives  . Penicillins Itching and Rash    Patient Measurements:   Adjusted Body Weight:   Vital Signs: Temp: 99.9 F (37.7 C) (12/21 1500) Temp src: Other (Comment) (12/21 1339) BP: 105/44 mmHg (12/21 1335) Pulse Rate: 65  (12/21 1500)  Labs:  Basename 02/14/11 0930  HGB 10.3*  HCT 32.3*  PLT 173  APTT --  LABPROT 25.4*  INR 2.27*  HEPARINUNFRC --  CREATININE 1.19*  CKTOTAL 211*  CKMB 3.2  TROPONINI <0.30   CrCl = 31 ml/min normalized  Medical History: Past Medical History  Diagnosis Date  . Femoral artery occlusion   . Coronary artery disease   . COPD (chronic obstructive pulmonary disease)   . Atrial fibrillation   . Dysphagia     pills have to be crushed and given with applesauce  . Arthritis   . Degenerative arthritis of spine   . Pneumonia     hx aspiraton pna  . Bleeding nose Dec. 2012    Medications:  Scheduled:    . sodium chloride   Intravenous Once  . acetaminophen  1,000 mg Oral Once  . albuterol  2.5 mg Nebulization Q6H  . albuterol  5 mg Nebulization Once  . aspirin EC  81 mg Oral QPM  . Calcium Carbonate-Vitamin D  1 tablet Oral BID  . cholecalciferol  2,000 Units Oral Daily  . clindamycin (CLEOCIN) IV  600 mg Intravenous Q8H  . digoxin  0.125 mg Oral Daily  . diltiazem  240 mg Oral Daily  . docusate sodium  100 mg Oral BID  . guaiFENesin  600 mg Oral BID  . ipratropium  0.5 mg Nebulization Once  . ipratropium  0.5 mg Nebulization Q6H  . methylPREDNISolone (SOLU-MEDROL) injection  125 mg Intravenous Once  . methylPREDNISolone (SOLU-MEDROL) injection  80 mg Intravenous Q8H  . moxifloxacin  400 mg Intravenous Once  . pantoprazole  80 mg Oral Q1200  . sodium chloride  3 mL Intravenous Q12H  . DISCONTD: Calcium Carbonate-Vitamin D  1 tablet Oral BID  . DISCONTD:  digoxin  125 mcg Oral Q0700    Assessment:  77 yo female on chronic coumadin for afib  Home dose reported as 2mg  daily  Potential drug interactions note with multiple antibiotics both current & prior to admission  INR today is therapeutic 2.27   Goal of Therapy:  INR 2-3   Plan:   Coumadin 2mg  today as per home dose  Daily PT/INR  Monitor for interaction with avelox  Rollene Fare 02/14/2011,4:01 PM Pager: [phone]

## 2011-02-14 NOTE — Progress Notes (Signed)
Speech Language Pathology Cancellation Note  Orders received for evaluation. Given history of dysphagia and COPD (which increases risk of silent aspiration), will defer bedside swallow evaluation and proceed directly to MBS to objectively assess swallow function and safety.   Pt has had 3 MBS in the past (6/09, 9/09, 4/10).  The most recent study (April 2010) indicated moderate dysphagia with delayed swallow, penetration of thin and nectar thick liquids, mild post-swallow pharyngeal residue.  Suspicion was raised regarding possible esophageal issues, given difficulty with barium tablet. (from MBS report 05/29/08: "Pill taken with applesauce remained in upper cervical esophagus for at least 10 minutes despite liquid and puree washes.  Pill did eventually clear through esophagus. Esophageal sweep of puree bolus transit revealed what appeared to be stasis throughout esophagus"...[name] KitchenMarland KitchenDysphagia is chronic.  Potentially advance diet with known risks of aspiration when pt reports baseline level of functioning and is able to cough productively.")  Diet recommended at the time of this study was Dys 3 with honey thick liquids.   ST will follow up next date and proceed with repeat MBS if appropriate to do so.  Thank you for this referral! [name] B. Treylen Gibbs, MSP, CCC-SLP [phone]

## 2011-02-14 NOTE — ED Notes (Signed)
Pt care assumed, obtained verbal report.  Pt resting comfortably with family at bedside.  Pt's daughter  Placed a wash cloth behind pt's neck d/t pt being hot.  Pt is A&Ox 4.  Pt reports breathing is better.  Pt is on O2 at 4L [place]-reports using home O2 at 3L all the time.

## 2011-02-14 NOTE — ED Notes (Signed)
Pt only able to speak few words at a time

## 2011-02-14 NOTE — ED Notes (Signed)
Pt's daughter at bedside is requesting for pt not to be fed unless she or her other sister is at bedside.

## 2011-02-14 NOTE — ED Provider Notes (Signed)
History     CSN: [id number]  Arrival date & time 02/14/11  4098   First MD Initiated Contact with Patient 02/14/11 [phone]      Chief Complaint  Patient presents with  . Shortness of Breath    (Consider location/radiation/quality/duration/timing/severity/associated sxs/prior treatment) HPI Comments: Patient presents from home with SOB and hypoxia.  Saw PCP yesterday for same and had Xray but doesn't know result. She was sent home on Levaquin. Work of breathing worsened overnight and hypoxia to 80% on EMS arrival.  On 3L O2 at home for COPD.  No chest pain, fever, cough.  No abdominal pain, nausea or vomiting.  Patient daughter is concerned that she had aspirated even know she uses thickened diet. She has been recently been in the ED for epistaxis and treated with a Rhino Rocket.  The history is provided by the patient and the EMS personnel.    Past Medical History  Diagnosis Date  . Femoral artery occlusion   . Coronary artery disease   . COPD (chronic obstructive pulmonary disease)   . Atrial fibrillation   . Dysphagia     pills have to be crushed and given with applesauce  . Arthritis   . Degenerative arthritis of spine   . Pneumonia     hx aspiraton pna  . Bleeding nose Dec. 2012    Past Surgical History  Procedure Date  . Pacemaker insertion   . Abdominal hysterectomy   . Appendectomy   . Stomach surgery 1940's    stomach surgery due to gangrene  . Cholecystectomy     History reviewed. No pertinent family history.  History  Substance Use Topics  . Smoking status: Former Smoker    Types: Cigarettes    Quit date: 02/13/1989  . Smokeless tobacco: Never Used  . Alcohol Use: No    OB History    Grav Para Term Preterm Abortions TAB SAB Ect Mult Living                  Review of Systems  Unable to perform ROS: Unstable vital signs  Respiratory: Positive for shortness of breath.     Allergies  Sulfonamide derivatives and Penicillins  Home Medications    Current Outpatient Rx  Name Route Sig Dispense Refill  . VITAMIN C 1000 MG PO TABS Oral Take 4,000 mg by mouth daily.      . ASPIRIN 81 MG PO TABS Oral Take 81 mg by mouth every evening.     [name] Kitchen BETHANECHOL CHLORIDE 10 MG PO TABS Oral Take 10 mg by mouth 3 (three) times daily.      [name] Kitchen CALCIUM CARBONATE-VITAMIN D 500-500 MG-UNIT PO CHEW Oral Chew 1 tablet by mouth 2 (two) times daily.      [name] Kitchen VITAMIN D 2000 UNITS PO TABS Oral Take 2,000 Units by mouth daily.      [name] Kitchen CRANBERRY EXTRACT PO Oral Take 1 tablet by mouth 2 (two) times daily.      [name] Kitchen DIGOXIN 0.25 MG PO TABS Oral Take 0.125 mcg by mouth every morning. Pt takes (1/2 tab) for 0.146mcg dose    . DILTIAZEM HCL ER 240 MG PO CP24 Oral Take 240 mg by mouth daily.     [name] Kitchen DOXYCYCLINE HYCLATE 50 MG PO CAPS Oral Take 100 mg by mouth 2 (two) times daily. Pt started this on 02-10-11 for 3 day therapy. Completed on 02-14-11     . ESOMEPRAZOLE MAGNESIUM 40 MG PO CPDR Oral Take 40 mg by  mouth daily before breakfast.      . FUROSEMIDE 40 MG PO TABS Oral Take 40 mg by mouth every morning.     . GUAIFENESIN ER 600 MG PO TB12 Oral Take 600 mg by mouth 2 (two) times daily.      . GUAIFENESIN 100 MG/5ML PO SOLN Oral Take 5 mLs by mouth every 4 (four) hours as needed. cough     . HYDROCHLOROTHIAZIDE 12.5 MG PO TABS Oral Take 12.5 mg by mouth daily.      [name] Kitchen HYDROCODONE-ACETAMINOPHEN 5-325 MG PO TABS Oral Take 1 tablet by mouth every 6 (six) hours as needed. pain     . IRON PO Oral Take 1 tablet by mouth daily.      [name] Kitchen MACULAR VITAMIN BENEFIT PO Oral Take 2 tablets by mouth.      . ESTROVEN PO Oral Take 1 tablet by mouth daily.      [name] Kitchen PROMETHAZINE HCL 25 MG PO TABS Oral Take 25 mg by mouth every 6 (six) hours as needed. nausea     . WARFARIN SODIUM 2 MG PO TABS Oral Take 2 mg by mouth every evening.       BP 105/44  Pulse 65  Temp(Src) 99.9 F (37.7 C) (Other (Comment))  Resp 20  SpO2 97%  Physical Exam  Constitutional: She appears well-developed and  well-nourished. She appears distressed.       Moderate respiratory distress, speaking in short sentences  HENT:  Head: Normocephalic and atraumatic.  Mouth/Throat: Oropharynx is clear and moist. No oropharyngeal exudate.  Eyes: Conjunctivae and EOM are normal. Pupils are equal, round, and reactive to light.  Neck: Normal range of motion.  Cardiovascular: Normal rate, regular rhythm and normal heart sounds.   Pulmonary/Chest: Breath sounds normal. She is in respiratory distress.       Decreased BS throughout  Abdominal: Soft. There is no tenderness. There is no rebound and no guarding.  Musculoskeletal: Normal range of motion. She exhibits no edema and no tenderness.  Neurological: She is alert. No cranial nerve deficit.  Skin: Skin is warm.    ED Course  Procedures (including critical care time)  Labs Reviewed  CBC - Abnormal; Notable for the following:    RBC 3.52 (*)    Hemoglobin 10.3 (*)    HCT 32.3 (*)    All other components within normal limits  DIFFERENTIAL - Abnormal; Notable for the following:    Neutrophils Relative 83 (*)    Lymphocytes Relative 11 (*)    All other components within normal limits  COMPREHENSIVE METABOLIC PANEL - Abnormal; Notable for the following:    Chloride 94 (*)    Glucose, Bld 112 (*)    BUN 26 (*)    Creatinine, Ser 1.19 (*)    Albumin 3.2 (*)    AST 40 (*) SLIGHT HEMOLYSIS   GFR calc non Af Amer 38 (*)    GFR calc Af Amer 44 (*)    All other components within normal limits  LACTIC ACID, PLASMA - Abnormal; Notable for the following:    Lactic Acid, Venous 2.5 (*)    All other components within normal limits  CARDIAC PANEL(CRET KIN+CKTOT+MB+TROPI) - Abnormal; Notable for the following:    Total CK 211 (*)    All other components within normal limits  PROTIME-INR - Abnormal; Notable for the following:    Prothrombin Time 25.4 (*)    INR 2.27 (*)    All other components within normal limits  BLOOD GAS, ARTERIAL - Abnormal; Notable for  the following:    pH, Arterial 7.449 (*)    pO2, Arterial 62.2 (*)    Bicarbonate 29.2 (*)    Acid-Base Excess 5.1 (*)    All other components within normal limits  DIGOXIN LEVEL - Abnormal; Notable for the following:    Digoxin Level 0.6 (*)    All other components within normal limits  D-DIMER, QUANTITATIVE - Abnormal; Notable for the following:    D-Dimer, Quant 1.27 (*)    All other components within normal limits  PRO B NATRIURETIC PEPTIDE - Abnormal; Notable for the following:    Pro B Natriuretic peptide (BNP) 6326.0 (*)    All other components within normal limits  URINALYSIS, ROUTINE W REFLEX MICROSCOPIC  PROCALCITONIN  CULTURE, BLOOD (ROUTINE X 2)  CULTURE, BLOOD (ROUTINE X 2)  TSH  CARDIAC PANEL(CRET KIN+CKTOT+MB+TROPI)  CARDIAC PANEL(CRET KIN+CKTOT+MB+TROPI)  INFLUENZA PANEL BY PCR   Dg Chest Portable 1 View  02/14/2011  *RADIOLOGY REPORT*  Clinical Data: Shortness of breath, cough, fever and weakness, former smoker  PORTABLE CHEST - 1 VIEW  Comparison: Chest x-ray of 02/13/2011  Findings: There is cardiomegaly present with mild pulmonary vascular congestion.  No focal infiltrate is seen.  A single lead permanent pacemaker is present.  No acute bony abnormality is noted.  IMPRESSION: Cardiomegaly and mild pulmonary vascular congestion with basilar atelectasis.  Original Report Authenticated By: Juline Patch, M.D.     1. Dyspnea   2. Hypoxia       MDM  SOB, generalized weakness, hypoxia.  INcreased WOB with tachypnea.  No chest pain, fever, cough, History of COPD on home oxygen.  Consider COPD exacerbation, pneumonia, pulmonary embolism, CHF. Patient's work of breathing has improved after nebulizer and steroids. She is weaned down to her 3.5 L of home oxygen. Her tachypnea has improved. Chest x-ray does not show any acute infiltrate.  Her lab work is remarkable for elevated BNP and d-dimer. We'll obtain CT angios chest and that for further workup of her hypoxia is  given broad-spectrum antibiotic coverage for possibility of aspiration pneumonia.   Date: 02/14/2011  Rate: 60  Rhythm: paced  QRS Axis: left  Intervals: normal  ST/T Wave abnormalities: nonspecific ST/T changes  Conduction Disutrbances:none  Narrative Interpretation:   Old EKG Reviewed: unchanged  CRITICAL CARE Performed by: Glynn Octave   Total critical care time: 45  Critical care time was exclusive of separately billable procedures and treating other patients.  Critical care was necessary to treat or prevent imminent or life-threatening deterioration.  Critical care was time spent personally by me on the following activities: development of treatment plan with patient and/or surrogate as well as nursing, discussions with consultants, evaluation of patient's response to treatment, examination of patient, obtaining history from patient or surrogate, ordering and performing treatments and interventions, ordering and review of laboratory studies, ordering and review of radiographic studies, pulse oximetry and re-evaluation of patient's condition.   Glynn Octave, MD 02/14/11 2000

## 2011-02-14 NOTE — ED Notes (Signed)
Pt from home, went to PCP yesterday for shob. Told she may have possible PNA, hx of aspiration pna. Shob worsened overnight. RR 35. Original RA O2 sat with ems 80%, placed on NRB 100%. On 3L O2 at home. Hx of COPD. Tried neb tx x1 at home.

## 2011-02-15 LAB — PROTIME-INR: INR: 2.45 — ABNORMAL HIGH (ref 0.00–1.49)

## 2011-02-15 LAB — CARDIAC PANEL(CRET KIN+CKTOT+MB+TROPI)
CK, MB: 4.1 ng/mL — ABNORMAL HIGH (ref 0.3–4.0)
CK, MB: 4.5 ng/mL — ABNORMAL HIGH (ref 0.3–4.0)
Relative Index: 1.9 (ref 0.0–2.5)
Total CK: 251 U/L — ABNORMAL HIGH (ref 7–177)
Total CK: 238 U/L — ABNORMAL HIGH (ref 7–177)
Troponin I: <0.3 ng/mL (ref <0.30)
Troponin I: <0.3 ng/mL (ref <0.30)

## 2011-02-15 MED ORDER — MUPIROCIN 2 % EX OINT
1.0000 "application " | TOPICAL_OINTMENT | Freq: Two times a day (BID) | CUTANEOUS | Status: AC
  Administered 2011-02-15 – 2011-02-19 (×10): 1 via NASAL
  Filled 2011-02-15 (×2): qty 22

## 2011-02-15 MED ORDER — TIOTROPIUM BROMIDE MONOHYDRATE 18 MCG IN CAPS: 18.0000 ug | ORAL_CAPSULE | Freq: Every day | RESPIRATORY_TRACT | Status: DC

## 2011-02-15 MED ORDER — FUROSEMIDE 40 MG PO TABS
40.0000 mg | ORAL_TABLET | Freq: Every day | ORAL | Status: DC
  Administered 2011-02-15 – 2011-02-20 (×6): 40 mg via ORAL
  Filled 2011-02-15 (×6): qty 1

## 2011-02-15 MED ORDER — POLYETHYLENE GLYCOL 3350 17 G PO PACK
17.0000 g | PACK | Freq: Every day | ORAL | Status: DC
  Administered 2011-02-15 – 2011-02-16 (×2): 17 g via ORAL
  Filled 2011-02-15 (×2): qty 1

## 2011-02-15 MED ORDER — WARFARIN SODIUM 2 MG PO TABS
2.0000 mg | ORAL_TABLET | Freq: Once | ORAL | Status: AC
  Administered 2011-02-15: 2 mg via ORAL
  Filled 2011-02-15: qty 1

## 2011-02-15 MED ORDER — CHLORHEXIDINE GLUCONATE CLOTH 2 % EX PADS
6.0000 | MEDICATED_PAD | Freq: Every day | CUTANEOUS | Status: AC
  Administered 2011-02-17 – 2011-02-18 (×2): 6 via TOPICAL

## 2011-02-15 MED ORDER — FLUTICASONE-SALMETEROL 250-50 MCG/DOSE IN AEPB
1.0000 | INHALATION_SPRAY | Freq: Two times a day (BID) | RESPIRATORY_TRACT | Status: DC
  Administered 2011-02-15 – 2011-02-20 (×10): 1 via RESPIRATORY_TRACT
  Filled 2011-02-15: qty 14

## 2011-02-15 MED ORDER — GUAIFENESIN-DM 100-10 MG/5ML PO SYRP
5.0000 mL | ORAL_SOLUTION | ORAL | Status: DC | PRN
  Administered 2011-02-15 – 2011-02-18 (×3): 5 mL via ORAL
  Filled 2011-02-15 (×4): qty 10

## 2011-02-15 MED ORDER — PHENOL 1.4 % MT LIQD
2.0000 | OROMUCOSAL | Status: DC | PRN
  Administered 2011-02-15: 2 via OROMUCOSAL
  Filled 2011-02-15: qty 177

## 2011-02-15 NOTE — Progress Notes (Signed)
Speech Language Pathology Cancellation Note  Pt's daughter at bedside, reporting pt not feeling well this morning.  Pt currently sleeping soundly.  Daughter reports poor po intake, continuing to receive thickened liquids.  Daughter also indicated following the [employer] Protocol at home, providing water following thorough oral care several times a day.   Discussed repeating MBS during this admission, due to length of time since last study, COPD, recurrent aspiration pneumonia, and advanced age.  Daughter is in full agreement with this plan, however, wishes to hold on proceeding until pt status improves.  Subject of feeding tube placement was also discussed.  At this time, pt's daughter indicates not wanting feeding tube placement should pt be unable to take food or liquid by mouth safely.  Will proceed with MBS when appropriate to objectively assess swallow function and safety, and to identify least restrictive diet level.  RN informed. Celia B. Bueche, MSP, CCC-SLP [phone]

## 2011-02-15 NOTE — Progress Notes (Signed)
ANTICOAGULATION CONSULT NOTE - Follow Up Consult  Pharmacy Consult for Coumadin Indication: atrial fibrillation  Allergies  Allergen Reactions  . Sulfonamide Derivatives Hives  . Penicillins Itching and Rash    Patient Measurements: Height: 5\' 1"  (154.9 cm) Weight: 153 lb (69.4 kg) IBW/kg (Calculated) : 47.8    Vital Signs: Temp: 98.5 F (36.9 C) (12/22 0800) Temp src: Oral (12/22 0800) BP: 161/70 mmHg (12/22 1053) Pulse Rate: 59  (12/22 1100)  Labs:  Basename 02/15/11 0830 02/15/11 0307 02/15/11 0036 02/14/11 1630 02/14/11 0930  HGB -- -- -- -- 10.3*  HCT -- -- -- -- 32.3*  PLT -- -- -- -- 173  APTT -- -- -- -- --  LABPROT -- 27.0* -- -- 25.4*  INR -- 2.45* -- -- 2.27*  HEPARINUNFRC -- -- -- -- --  CREATININE -- -- -- -- 1.19*  CKTOTAL 238* -- 251* 207* --  CKMB 4.5* -- 4.1* 3.2 --  TROPONINI <0.30 -- <0.30 <0.30 --   Estimated Creatinine Clearance: 25.2 ml/min (by C-G formula based on Cr of 1.19).   Medications:  Scheduled:    . albuterol  2.5 mg Nebulization Q6H  . aspirin EC  81 mg Oral QPM  . calcium carbonate  1 tablet Oral BID WC  . Chlorhexidine Gluconate Cloth  6 each Topical Q0600  . cholecalciferol  2,000 Units Oral Daily  . clindamycin (CLEOCIN) IV  600 mg Intravenous Q8H  . digoxin  0.125 mg Oral Daily  . diltiazem  240 mg Oral Daily  . docusate sodium  100 mg Oral BID  . furosemide  40 mg Oral Daily  . guaiFENesin  600 mg Oral BID  . ipratropium  0.5 mg Nebulization Q6H  . methylPREDNISolone (SOLU-MEDROL) injection  80 mg Intravenous Q8H  . moxifloxacin  400 mg Intravenous Once  . mupirocin ointment  1 application Nasal BID  . pantoprazole  80 mg Oral Q1200  . sodium chloride  3 mL Intravenous Q12H  . warfarin  2 mg Oral ONCE-1800  . DISCONTD: Calcium Carbonate-Vitamin D  1 tablet Oral BID  . DISCONTD: Calcium Carbonate-Vitamin D  1 tablet Oral BID  . DISCONTD: digoxin  125 mcg Oral Q0700   Infusions:   PRN: sodium chloride,  acetaminophen, acetaminophen, albuterol, food thickener, ondansetron (ZOFRAN) IV, ondansetron, oxyCODONE, senna-docusate, sodium chloride  Assessment: 77 yo on chronic coumadin for history of afib. Dose PTA 2mg  daily. Rec'd 2mg  last night inpatient. INR therapeutic. No bleeding reported. Received one dose of Avelox in ER 12/21 (may increase INR, will monitor)  Goal of Therapy:  INR 2-3   Plan:  Coumadin 2mg  tonight as per home regimen. F/U am labs.  Gwen Her PharmD  [phone] 02/15/2011 11:20 AM

## 2011-02-15 NOTE — Progress Notes (Signed)
  Echocardiogram 2D Echocardiogram has been performed.  Peggy Harvey 02/15/2011, 9:56 AM

## 2011-02-15 NOTE — Progress Notes (Signed)
Subjective: Feeling better today; no fever overnight; mild improved in her cough. No CP. Still with significant wheezing and moderate SOB/desat with minimally exertion.  Objective: Vital signs in last 24 hours: Temp:  [98.1 F (36.7 C)-101.7 F (38.7 C)] 98.3 F (36.8 C) (12/22 0400) Pulse Rate:  [59-67] 59  (12/22 0000) Resp:  [18-22] 18  (12/22 0000) BP: (105-160)/(36-57) 133/57 mmHg (12/22 0000) SpO2:  [81 %-98 %] 97 % (12/22 0000) Weight:  [69 kg (152 lb 1.9 oz)-69.4 kg (153 lb)] 153 lb (69.4 kg) (12/22 0300) Weight change:  Last BM Date: 02/13/11  Intake/Output from previous day: 12/21 0701 - 12/22 0700 In: 210 [I.V.:110; IV Piggyback:100] Out: 1035 [Urine:1035]     Physical Exam: General: Alert, awake, oriented x3, in no acute distress. HEENT: No bruits, no goiter. Heart: irregular/irregular; without murmurs, rubs, gallops. Lungs:expiratory wheezing and also bibasilar crackles; decrease air movement Abdomen: Soft, nontender, nondistended, positive bowel sounds. Extremities: No clubbing cyanosis or edema with positive pedal pulses. Neuro: Grossly intact, nonfocal.   Lab Results: Basic Metabolic Panel:  Basename 02/14/11 0930  NA 135  K 4.5  CL 94*  CO2 28  GLUCOSE 112*  BUN 26*  CREATININE 1.19*  CALCIUM 9.2  MG --  PHOS --   Liver Function Tests:  Basename 02/14/11 0930  AST 40*  ALT 20  ALKPHOS 80  BILITOT 0.4  PROT 6.6  ALBUMIN 3.2*   CBC:  Basename 02/14/11 0930  WBC 9.3  NEUTROABS 7.7  HGB 10.3*  HCT 32.3*  MCV 91.8  PLT 173   Cardiac Enzymes:  Basename 02/15/11 0036 02/14/11 1630 02/14/11 0930  CKTOTAL 251* 207* 211*  CKMB 4.1* 3.2 3.2  CKMBINDEX -- -- --  TROPONINI <0.30 <0.30 <0.30   D-Dimer:  Basename 02/14/11 0930  DDIMER 1.27*    Thyroid Function Tests:  Basename 02/14/11 1630  TSH 1.195  T4TOTAL --  FREET4 --  T3FREE --  THYROIDAB --   Coagulation:  Basename 02/15/11 0307 02/14/11 0930  LABPROT 27.0* 25.4*   INR 2.45* 2.27*    Misc. Labs:  Recent Results (from the past 240 hour(s))  MRSA PCR SCREENING     Status: Abnormal   Collection Time   02/14/11  4:56 PM      Component Value Range Status Comment   MRSA by PCR POSITIVE (*) NEGATIVE  Final     Studies/Results: Dg Chest Portable 1 View  02/14/2011  *RADIOLOGY REPORT*  Clinical Data: Shortness of breath, cough, fever and weakness, former smoker  PORTABLE CHEST - 1 VIEW  Comparison: Chest x-ray of 02/13/2011  Findings: There is cardiomegaly present with mild pulmonary vascular congestion.  No focal infiltrate is seen.  A single lead permanent pacemaker is present.  No acute bony abnormality is noted.  IMPRESSION: Cardiomegaly and mild pulmonary vascular congestion with basilar atelectasis.  Original Report Authenticated By: Juline Patch, M.D.    Medications: Scheduled Meds:   . sodium chloride   Intravenous Once  . acetaminophen  1,000 mg Oral Once  . albuterol  2.5 mg Nebulization Q6H  . albuterol  5 mg Nebulization Once  . aspirin EC  81 mg Oral QPM  . calcium carbonate  1 tablet Oral BID WC  . Chlorhexidine Gluconate Cloth  6 each Topical Q0600  . cholecalciferol  2,000 Units Oral Daily  . clindamycin (CLEOCIN) IV  600 mg Intravenous Q8H  . digoxin  0.125 mg Oral Daily  . diltiazem  240 mg Oral Daily  .  docusate sodium  100 mg Oral BID  . furosemide  40 mg Oral Daily  . guaiFENesin  600 mg Oral BID  . ipratropium  0.5 mg Nebulization Once  . ipratropium  0.5 mg Nebulization Q6H  . methylPREDNISolone (SOLU-MEDROL) injection  125 mg Intravenous Once  . methylPREDNISolone (SOLU-MEDROL) injection  80 mg Intravenous Q8H  . moxifloxacin  400 mg Intravenous Once  . mupirocin ointment  1 application Nasal BID  . pantoprazole  80 mg Oral Q1200  . sodium chloride  3 mL Intravenous Q12H  . warfarin  2 mg Oral ONCE-1800  . DISCONTD: Calcium Carbonate-Vitamin D  1 tablet Oral BID  . DISCONTD: Calcium Carbonate-Vitamin D  1 tablet  Oral BID  . DISCONTD: digoxin  125 mcg Oral Q0700   Continuous Infusions:  PRN Meds:.sodium chloride, acetaminophen, acetaminophen, albuterol, food thickener, ondansetron (ZOFRAN) IV, ondansetron, oxyCODONE, senna-docusate, sodium chloride  Assessment/Plan: 1-Respiratory failure, acute-on-chronic: at this point most likely 2/2 COPD exacerbation with a component of what appears to be vascular congestion (presumed due to diastolic dysfunction). Continue steroids, nebulizer tx and antibiotics. Follow influenza PCR and 2-D echo. Start lasix 40mg  daily and continue strict I's and O's and daily weight.  2-Atrial fibrillation:Continue digoxin and diltiazem. Coumadin per pharmacy. Rate is controlled.Patient s/p Pacemaker implantation.  3-Dysphagia:Continue thick liquids and will follow speech therapy recommendations.  4-GERD: continue PPI.  5-MRSA: start decolonization process with mupirocin and chlorhexidine cloths.  6-DVT: on coumadin.     LOS: 1 day   Makalynn Berwanger [facility] [phone]  02/15/2011, 8:05 AM

## 2011-02-16 LAB — BASIC METABOLIC PANEL
Chloride: 96 mEq/L (ref 96–112)
GFR calc Af Amer: 38 mL/min — ABNORMAL LOW (ref >90)
GFR calc non Af Amer: 33 mL/min — ABNORMAL LOW (ref >90)
Potassium: 3.9 mEq/L (ref 3.5–5.1)
Sodium: 137 mEq/L (ref 135–145)

## 2011-02-16 LAB — PROTIME-INR: Prothrombin Time: 39.5 seconds — ABNORMAL HIGH (ref 11.6–15.2)

## 2011-02-16 LAB — INFLUENZA PANEL BY PCR (TYPE A & B): H1N1 flu by pcr: NOT DETECTED — AB

## 2011-02-16 MED ORDER — METHYLPREDNISOLONE SODIUM SUCC 125 MG IJ SOLR
80.0000 mg | Freq: Two times a day (BID) | INTRAMUSCULAR | Status: DC
  Administered 2011-02-16 – 2011-02-17 (×2): 80 mg via INTRAVENOUS
  Filled 2011-02-16 (×3): qty 1.28

## 2011-02-16 MED ORDER — SENNOSIDES-DOCUSATE SODIUM 8.6-50 MG PO TABS
1.0000 | ORAL_TABLET | Freq: Two times a day (BID) | ORAL | Status: DC
  Administered 2011-02-16 – 2011-02-17 (×4): 1 via ORAL
  Filled 2011-02-16 (×6): qty 1

## 2011-02-16 MED ORDER — NYSTATIN 100000 UNIT/ML MT SUSP
5.0000 mL | Freq: Four times a day (QID) | OROMUCOSAL | Status: DC
  Administered 2011-02-16 – 2011-02-17 (×7): 500000 [IU] via ORAL
  Filled 2011-02-16: qty 60

## 2011-02-16 MED ORDER — TAMSULOSIN HCL 0.4 MG PO CAPS
0.4000 mg | ORAL_CAPSULE | Freq: Every day | ORAL | Status: DC
  Administered 2011-02-16 – 2011-02-20 (×5): 0.4 mg via ORAL
  Filled 2011-02-16 (×5): qty 1

## 2011-02-16 MED ORDER — IPRATROPIUM BROMIDE 0.02 % IN SOLN
0.5000 mg | Freq: Three times a day (TID) | RESPIRATORY_TRACT | Status: DC
  Administered 2011-02-17 – 2011-02-20 (×11): 0.5 mg via RESPIRATORY_TRACT
  Filled 2011-02-16 (×12): qty 2.5

## 2011-02-16 MED ORDER — POLYETHYLENE GLYCOL 3350 17 G PO PACK
17.0000 g | PACK | Freq: Two times a day (BID) | ORAL | Status: DC
  Administered 2011-02-16 – 2011-02-17 (×3): 17 g via ORAL
  Filled 2011-02-16 (×5): qty 1

## 2011-02-16 MED ORDER — ALBUTEROL SULFATE (5 MG/ML) 0.5% IN NEBU
2.5000 mg | INHALATION_SOLUTION | Freq: Three times a day (TID) | RESPIRATORY_TRACT | Status: DC
  Administered 2011-02-17 – 2011-02-20 (×11): 2.5 mg via RESPIRATORY_TRACT
  Filled 2011-02-16 (×9): qty 0.5

## 2011-02-16 NOTE — Progress Notes (Signed)
Subjective: Feeling better today; no fever. No CP. Minimal expiratory wheezing and on some cough. Patient also complaining of constipation, and inability to void after Foley removal. She is also complaining of sore throat.  Objective: Vital signs in last 24 hours: Temp:  [97.9 F (36.6 C)-98.1 F (36.7 C)] 98.1 F (36.7 C) (12/23 0600) Pulse Rate:  [60-68] 68  (12/23 0942) Resp:  [17-19] 18  (12/23 0600) BP: (127-147)/(46-67) 131/50 mmHg (12/23 0600) SpO2:  [88 %-97 %] 97 % (12/23 0806) Weight change:  Last BM Date: 02/13/11  Intake/Output from previous day: 12/22 0701 - 12/23 0700 In: 103 [P.O.:50; I.V.:53] Out: 775 [Urine:775]     Physical Exam: General: Alert, awake, oriented x3, in no acute distress. HEENT: No bruits, no goiter. Thrush appreciated in the back of her throat. Heart: irregular/irregular; without murmurs, rubs, gallops. Lungs:expiratory wheezing; no rhonchi no crackles; increase air movement bilaterally. Abdomen: Soft, nontender, nondistended, positive bowel sounds. Extremities: No clubbing cyanosis or edema with positive pedal pulses. Neuro: Grossly intact, nonfocal.   Lab Results: Basic Metabolic Panel:  Basename 02/16/11 0600 02/14/11 0930  NA 137 135  K 3.9 4.5  CL 96 94*  CO2 30 28  GLUCOSE 198* 112*  BUN 35* 26*  CREATININE 1.33* 1.19*  CALCIUM 9.0 9.2  MG -- --  PHOS -- --   Liver Function Tests:  Basename 02/14/11 0930  AST 40*  ALT 20  ALKPHOS 80  BILITOT 0.4  PROT 6.6  ALBUMIN 3.2*   CBC:  Basename 02/14/11 0930  WBC 9.3  NEUTROABS 7.7  HGB 10.3*  HCT 32.3*  MCV 91.8  PLT 173   Cardiac Enzymes:  Basename 02/15/11 0830 02/15/11 0036 02/14/11 1630  CKTOTAL 238* 251* 207*  CKMB 4.5* 4.1* 3.2  CKMBINDEX -- -- --  TROPONINI <0.30 <0.30 <0.30   D-Dimer:  Basename 02/14/11 0930  DDIMER 1.27*    Thyroid Function Tests:  Basename 02/14/11 1630  TSH 1.195  T4TOTAL --  FREET4 --  T3FREE --  THYROIDAB --    Coagulation:  Basename 02/16/11 0600 02/15/11 0307  LABPROT 39.5* 27.0*  INR 3.99* 2.45*    Misc. Labs:  Recent Results (from the past 240 hour(s))  CULTURE, BLOOD (ROUTINE X 2)     Status: Normal (Preliminary result)   Collection Time   02/14/11  9:30 AM      Component Value Range Status Comment   Specimen Description BLOOD RIGHT WRIST   Final    Special Requests BOTTLES DRAWN AEROBIC AND ANAEROBIC 5CC   Final    Setup Time [id number]   Final    Culture     Final    Value:        BLOOD CULTURE RECEIVED NO GROWTH TO DATE CULTURE WILL BE HELD FOR 5 DAYS BEFORE ISSUING A FINAL NEGATIVE REPORT   Report Status PENDING   Incomplete   CULTURE, BLOOD (ROUTINE X 2)     Status: Normal (Preliminary result)   Collection Time   02/14/11  9:30 AM      Component Value Range Status Comment   Specimen Description BLOOD LEFT WRIST   Final    Special Requests BOTTLES DRAWN AEROBIC AND ANAEROBIC 5CC   Final    Setup Time [id number]   Final    Culture     Final    Value:        BLOOD CULTURE RECEIVED NO GROWTH TO DATE CULTURE WILL BE HELD FOR 5 DAYS BEFORE ISSUING A FINAL  NEGATIVE REPORT   Report Status PENDING   Incomplete   MRSA PCR SCREENING     Status: Abnormal   Collection Time   02/14/11  4:56 PM      Component Value Range Status Comment   MRSA by PCR POSITIVE (*) NEGATIVE  Final     Studies/Results: No results found.  Medications: Scheduled Meds:    . albuterol  2.5 mg Nebulization Q6H  . aspirin EC  81 mg Oral QPM  . calcium carbonate  1 tablet Oral BID WC  . Chlorhexidine Gluconate Cloth  6 each Topical Q0600  . cholecalciferol  2,000 Units Oral Daily  . clindamycin (CLEOCIN) IV  600 mg Intravenous Q8H  . digoxin  0.125 mg Oral Daily  . diltiazem  240 mg Oral Daily  . Fluticasone-Salmeterol  1 puff Inhalation BID  . furosemide  40 mg Oral Daily  . ipratropium  0.5 mg Nebulization Q6H  . methylPREDNISolone (SOLU-MEDROL) injection  80 mg Intravenous Q12H  .  mupirocin ointment  1 application Nasal BID  . nystatin  5 mL Oral QID  . pantoprazole  80 mg Oral Q1200  . polyethylene glycol  17 g Oral BID  . senna-docusate  1 tablet Oral BID  . sodium chloride  3 mL Intravenous Q12H  . Tamsulosin HCl  0.4 mg Oral Daily  . warfarin  2 mg Oral ONCE-1800  . DISCONTD: docusate sodium  100 mg Oral BID  . DISCONTD: guaiFENesin  600 mg Oral BID  . DISCONTD: methylPREDNISolone (SOLU-MEDROL) injection  80 mg Intravenous Q8H  . DISCONTD: polyethylene glycol  17 g Oral Daily  . DISCONTD: tiotropium  18 mcg Inhalation Daily   Continuous Infusions:  PRN Meds:.sodium chloride, acetaminophen, acetaminophen, albuterol, food thickener, guaiFENesin-dextromethorphan, ondansetron (ZOFRAN) IV, ondansetron, oxyCODONE, phenol, sodium chloride, DISCONTD: senna-docusate  Assessment/Plan: 1-Respiratory failure, acute-on-chronic: Improve, now with minimal wheezing and no crackles/rhonchi on lung auscultation. Will decrease Solu-Medrol to every 12 hours, continue nebulizers and inhalers as previously indicated. Will continue antibiotics and repeat x-ray tomorrow morning.  2-Atrial fibrillation:Continue digoxin and diltiazem. Coumadin per pharmacy.  3-Dysphagia:Continue thick liquids and will follow speech therapy recommendations.  4-GERD: continue PPI.  5-MRSA: start decolonization process with mupirocin and chlorhexidine cloths.  6-DVT: on coumadin.   7-constipation: Increase MiraLAX and Senokot twice a day.  8-sore throat: Secondary to thrush; will start nystatin.  9-deconditioning: Will ask physical therapy to work with  patient and will place order for out of bed to chair at least 3 times a day with [place]se assistance.  10-urinary retention: Will start Flomax daily. Will also follow latter scan in and out catheter. If in and out cath needed more than 3 times will replace Foley.    LOS: 2 days   Laural Eiland [facility] [phone]  02/16/2011, 11:39  AM

## 2011-02-16 NOTE — Plan of Care (Signed)
Problem: Phase I Progression Outcomes Goal: First antibiotic given within 6hrs of admit Outcome: Completed/Met Date Met:  02/16/11 Patient received Avelox and Cleocin in ED within 6 hours  Goal: Confirm chest x-ray completed Outcome: Completed/Met Date Met:  02/16/11 Chest x-ray 12-21 at 10 a.m.  Goal: Voiding-avoid urinary catheter unless indicated Outcome: Progressing Catheter removed on 12-22, had to be in and out cathed during night, order to bladder scan and perform I&O cath x needed x 2

## 2011-02-16 NOTE — Progress Notes (Signed)
ANTICOAGULATION CONSULT NOTE - Follow Up Consult  Pharmacy Consult for Coumadin Indication: atrial fibrillation  Allergies  Allergen Reactions  . Sulfonamide Derivatives Hives  . Penicillins Itching and Rash    Patient Measurements: Height: 5\' 1"  (154.9 cm) Weight: 153 lb (69.4 kg) IBW/kg (Calculated) : 47.8    Vital Signs: Temp: 98.1 F (36.7 C) (12/23 0600) Temp src: Oral (12/23 0600) BP: 131/50 mmHg (12/23 0600) Pulse Rate: 60  (12/23 0600)  Labs:  Basename 02/16/11 0600 02/15/11 0830 02/15/11 0307 02/15/11 0036 02/14/11 1630 02/14/11 0930  HGB -- -- -- -- -- 10.3*  HCT -- -- -- -- -- 32.3*  PLT -- -- -- -- -- 173  APTT -- -- -- -- -- --  LABPROT 39.5* -- 27.0* -- -- 25.4*  INR 3.99* -- 2.45* -- -- 2.27*  HEPARINUNFRC -- -- -- -- -- --  CREATININE 1.33* -- -- -- -- 1.19*  CKTOTAL -- 238* -- 251* 207* --  CKMB -- 4.5* -- 4.1* 3.2 --  TROPONINI -- <0.30 -- <0.30 <0.30 --   Estimated Creatinine Clearance: 22.5 ml/min (by C-G formula based on Cr of 1.33).   Medications:  Scheduled:     . albuterol  2.5 mg Nebulization Q6H  . aspirin EC  81 mg Oral QPM  . calcium carbonate  1 tablet Oral BID WC  . Chlorhexidine Gluconate Cloth  6 each Topical Q0600  . cholecalciferol  2,000 Units Oral Daily  . clindamycin (CLEOCIN) IV  600 mg Intravenous Q8H  . digoxin  0.125 mg Oral Daily  . diltiazem  240 mg Oral Daily  . Fluticasone-Salmeterol  1 puff Inhalation BID  . furosemide  40 mg Oral Daily  . ipratropium  0.5 mg Nebulization Q6H  . methylPREDNISolone (SOLU-MEDROL) injection  80 mg Intravenous Q8H  . mupirocin ointment  1 application Nasal BID  . pantoprazole  80 mg Oral Q1200  . polyethylene glycol  17 g Oral Daily  . sodium chloride  3 mL Intravenous Q12H  . warfarin  2 mg Oral ONCE-1800  . DISCONTD: docusate sodium  100 mg Oral BID  . DISCONTD: guaiFENesin  600 mg Oral BID  . DISCONTD: tiotropium  18 mcg Inhalation Daily   Infusions:   PRN: sodium  chloride, acetaminophen, acetaminophen, albuterol, food thickener, guaiFENesin-dextromethorphan, ondansetron (ZOFRAN) IV, ondansetron, oxyCODONE, phenol, senna-docusate, sodium chloride  Assessment: 77 yo on chronic coumadin for history of afib. Dose PTA 2mg  daily. INR now supratherapeutic and rising. No reported bleeding per notes  Goal of Therapy:  INR 2-3   Plan:  No warfarin tonight. Recheck INR in AM 12/24.   Hessie Knows, PharmD, BCPS 02/16/2011 8:45 AM [phone]

## 2011-02-16 NOTE — Progress Notes (Signed)
Bladder scan performed reading 91 mls, I&O cath performed with 125 ccs of amber urine returned.

## 2011-02-16 NOTE — Plan of Care (Signed)
Problem: Phase I Progression Outcomes Goal: OOB as tolerated unless otherwise ordered Outcome: Progressing Patient up to chair evening of 12-22, new order to get patient up to chair 3 x a day

## 2011-02-17 ENCOUNTER — Inpatient Hospital Stay (HOSPITAL_COMMUNITY): Payer: Medicare Other

## 2011-02-17 LAB — PROTIME-INR
INR: 5.52 (ref 0.00–1.49)
Prothrombin Time: 50.9 seconds — ABNORMAL HIGH (ref 11.6–15.2)

## 2011-02-17 MED ORDER — MOXIFLOXACIN HCL 400 MG PO TABS
400.0000 mg | ORAL_TABLET | Freq: Every day | ORAL | Status: DC
  Administered 2011-02-17 – 2011-02-19 (×3): 400 mg via ORAL
  Filled 2011-02-17 (×4): qty 1

## 2011-02-17 MED ORDER — PREDNISONE 50 MG PO TABS
80.0000 mg | ORAL_TABLET | Freq: Every day | ORAL | Status: DC
  Administered 2011-02-18: 80 mg via ORAL
  Filled 2011-02-17: qty 1

## 2011-02-17 MED ORDER — OSELTAMIVIR PHOSPHATE 75 MG PO CAPS
75.0000 mg | ORAL_CAPSULE | Freq: Two times a day (BID) | ORAL | Status: DC
  Administered 2011-02-17 – 2011-02-20 (×7): 75 mg via ORAL
  Filled 2011-02-17 (×8): qty 1

## 2011-02-17 MED ORDER — LACTULOSE 10 GM/15ML PO SOLN
20.0000 g | Freq: Two times a day (BID) | ORAL | Status: DC
  Administered 2011-02-17 (×2): 20 g via ORAL
  Filled 2011-02-17 (×4): qty 30

## 2011-02-17 MED ORDER — MORPHINE SULFATE 2 MG/ML IJ SOLN
1.0000 mg | INTRAMUSCULAR | Status: DC | PRN
  Filled 2011-02-17: qty 1

## 2011-02-17 NOTE — Progress Notes (Signed)
Patient on bedside commode. Patient's daughter refused 1000 medications for patient. Stated, "You can come back when she's done."

## 2011-02-17 NOTE — Progress Notes (Signed)
Bladder scan complete shows 293.  Pt daughter has requested that we wait for awhile to place foley.  Explained to pt daughter the need to either void or cath and remove the urine to help prevent uti.  Pt place on bedside commode, if no void pt and daughter aware of need to place foley.

## 2011-02-17 NOTE — Progress Notes (Signed)
Subjective: Feeling better; no wheezing, no CP. Complaining of residual cough, sore throat and constipation.  Objective: Vital signs in last 24 hours: Temp:  [97.8 F (36.6 C)-98.3 F (36.8 C)] 98.3 F (36.8 C) (12/24 0600) Pulse Rate:  [63-68] 65  (12/24 0600) Resp:  [18] 18  (12/24 0600) BP: (127-139)/(49-63) 127/62 mmHg (12/24 0600) SpO2:  [91 %-95 %] 91 % (12/24 0955) Weight change:  Last BM Date: 02/13/11  Intake/Output from previous day: 12/23 0701 - 12/24 0700 In: 220 [I.V.:120; IV Piggyback:100] Out: 350 [Urine:350]     Physical Exam: General: Alert, awake, oriented x3, in no acute distress. HEENT: No bruits, no goiter. Significant improvement on her throat thrush. Heart: irregular/irregular; without murmurs, rubs, gallops. Lungs:good air movement; no wheezing; no rhonchi or crackles. Abdomen: Soft, nontender, nondistended, positive bowel sounds. Extremities: No clubbing cyanosis or edema with positive pedal pulses. Neuro: Grossly intact, nonfocal.   Lab Results: Basic Metabolic Panel:  Basename 02/16/11 0600  NA 137  K 3.9  CL 96  CO2 30  GLUCOSE 198*  BUN 35*  CREATININE 1.33*  CALCIUM 9.0  MG --  PHOS --   Cardiac Enzymes:  Basename 02/15/11 0830 02/15/11 0036 02/14/11 1630  CKTOTAL 238* 251* 207*  CKMB 4.5* 4.1* 3.2  CKMBINDEX -- -- --  TROPONINI <0.30 <0.30 <0.30   D-Dimer: No results found for this basename: DDIMER:2 in the last 72 hours  Thyroid Function Tests:  Basename 02/14/11 1630  TSH 1.195  T4TOTAL --  FREET4 --  T3FREE --  THYROIDAB --   Coagulation:  Basename 02/17/11 0500 02/16/11 0600  LABPROT 50.9* 39.5*  INR 5.52* 3.99*    Misc. Labs:  Recent Results (from the past 240 hour(s))  CULTURE, BLOOD (ROUTINE X 2)     Status: Normal (Preliminary result)   Collection Time   02/14/11  9:30 AM      Component Value Range Status Comment   Specimen Description BLOOD RIGHT WRIST   Final    Special Requests BOTTLES DRAWN  AEROBIC AND ANAEROBIC 5CC   Final    Setup Time [id number]   Final    Culture     Final    Value:        BLOOD CULTURE RECEIVED NO GROWTH TO DATE CULTURE WILL BE HELD FOR 5 DAYS BEFORE ISSUING A FINAL NEGATIVE REPORT   Report Status PENDING   Incomplete   CULTURE, BLOOD (ROUTINE X 2)     Status: Normal (Preliminary result)   Collection Time   02/14/11  9:30 AM      Component Value Range Status Comment   Specimen Description BLOOD LEFT WRIST   Final    Special Requests BOTTLES DRAWN AEROBIC AND ANAEROBIC 5CC   Final    Setup Time [id number]   Final    Culture     Final    Value:        BLOOD CULTURE RECEIVED NO GROWTH TO DATE CULTURE WILL BE HELD FOR 5 DAYS BEFORE ISSUING A FINAL NEGATIVE REPORT   Report Status PENDING   Incomplete   MRSA PCR SCREENING     Status: Abnormal   Collection Time   02/14/11  4:56 PM      Component Value Range Status Comment   MRSA by PCR POSITIVE (*) NEGATIVE  Final     Studies/Results: Dg Chest 2 View  02/17/2011  *RADIOLOGY REPORT*  Clinical Data: Shortness of breath  CHEST - 2 VIEW  Comparison: 02/14/2011  Findings: Left  subclavian transvenous pacemaker stable. Improvement in the pulmonary vascular congestion.  Patchy atelectasis or infiltrates in the lower lobes, left greater than right, slightly improved.  Heart size upper limits normal. Tortuous atheromatous thoracic aorta.  Regional bones unremarkable.  IMPRESSION:  1.  Improving vascular congestion and bibasilar atelectasis.  Original Report Authenticated By: Osa Craver, M.D.    Medications: Scheduled Meds:    . albuterol  2.5 mg Nebulization TID  . aspirin EC  81 mg Oral QPM  . calcium carbonate  1 tablet Oral BID WC  . Chlorhexidine Gluconate Cloth  6 each Topical Q0600  . cholecalciferol  2,000 Units Oral Daily  . digoxin  0.125 mg Oral Daily  . diltiazem  240 mg Oral Daily  . Fluticasone-Salmeterol  1 puff Inhalation BID  . furosemide  40 mg Oral Daily  . ipratropium  0.5  mg Nebulization TID  . lactulose  20 g Oral BID  . moxifloxacin  400 mg Oral q1800  . mupirocin ointment  1 application Nasal BID  . nystatin  5 mL Oral QID  . oseltamivir  75 mg Oral BID  . pantoprazole  80 mg Oral Q1200  . polyethylene glycol  17 g Oral BID  . predniSONE  80 mg Oral Q breakfast  . senna-docusate  1 tablet Oral BID  . sodium chloride  3 mL Intravenous Q12H  . Tamsulosin HCl  0.4 mg Oral Daily  . DISCONTD: albuterol  2.5 mg Nebulization Q6H  . DISCONTD: clindamycin (CLEOCIN) IV  600 mg Intravenous Q8H  . DISCONTD: ipratropium  0.5 mg Nebulization Q6H  . DISCONTD: methylPREDNISolone (SOLU-MEDROL) injection  80 mg Intravenous Q12H   Continuous Infusions:  PRN Meds:.sodium chloride, acetaminophen, acetaminophen, albuterol, food thickener, guaiFENesin-dextromethorphan, ondansetron (ZOFRAN) IV, ondansetron, oxyCODONE, phenol, sodium chloride  Assessment/Plan: 1-Respiratory failure, acute-on-chronic: Continue steroids, antibiotics and inhalers. Currently no wheezing and with good air movement.  2-Atrial fibrillation:Continue digoxin and diltiazem. Coumadin per pharmacy. INR supratherapeutic; will hold coumadin today.  3-Dysphagia:Continue thick liquids and will follow speech therapy recommendations.  4-GERD: continue PPI.  5-MRSA: continue mupirocin and chlorhexidine cloths.  6-DVT: on coumadin.   7-constipation: Continue miralax and sennokot; will also start lactulose.  8-sore throat: a lot better; continue nystatin  9-deconditioning: Will follow PT recommendations  10-urinary retention: foley replace due to inability to void. Continue flomax  11-Influenza: Started on tamiflu.   LOS: 3 days   Keyarah Mcroy [facility] [phone]  02/17/2011, 1:06 PM

## 2011-02-17 NOTE — Progress Notes (Signed)
Speech Language/Pathology Clinical/Bedside Swallow Evaluation Patient Details  Name: Peggy Harvey MRN: [id number] DOB: 06-01-1913 Today's Date: 02/17/2011 [id number]  Past Medical History:  Past Medical History  Diagnosis Date  . Femoral artery occlusion   . Coronary artery disease   . COPD (chronic obstructive pulmonary disease)   . Atrial fibrillation   . Dysphagia     pills have to be crushed and given with applesauce  . Arthritis   . Degenerative arthritis of spine   . Pneumonia     hx aspiraton pna  . Bleeding nose Dec. 2012   Past Surgical History:  Past Surgical History  Procedure Date  . Pacemaker insertion   . Abdominal hysterectomy   . Appendectomy   . Stomach surgery 1940's    stomach surgery due to gangrene  . Cholecystectomy    HPI:  77 year old female adm to [facility] 02/14/11, found to have pna.  PMH + for COPD, Afib, GERD, degenerative disk disease, dysphagia.  Pt CXR 02/17/11 with improved bilateral ATX + vascular congestion.  Pt has undergone several MBS studies in the past, last being 2010 with recommendation of regular/thin.  [place] 2010 with slight narrowing at GE junction and 13 mm tablet would not pass through this area.  Daughter has been thickening pts drinks at home to pudding, and providing thin water between meals.  Daughter reports pt coughs during intake of water, but will accept it. Pt last pna was 2 years ago per daughter. Daughter is ordering pt soft foods at hospital.  Intake has been poor in the last few month per daughter.    Assessment/Recommendations/Treatment Plan Suspected Esophageal Findings Suspected Esophageal Findings: Other (comment) (known GERD)  SLP Assessment Clinical Impression Statement: Pt appears with swallow that is at baseline, no overt s/s of aspiration with cracker, honey, nectar and applesauce.  Swallow appeared timely, but pt did c/o pain with swallow, pt receiving Nystatin.  CXR improved today.  Pt has chronic  multifactorial dysphagia that her daughter manages.  Pt has not had a pna for 2 years per daughter, therefore, she appears to be managing well.  Do not recommend repeat MBS as this will not change pt's outcomes.  Advised daughter to importance of symptom management regarding dysphagia, use of thickener as needed if it decreases cough and pna events, as well as importance of hydration and adequate respiratory status.  Also educated pt and daughter to COPD and dysphagia and episodic aspiration will be ongoing.  Daughter verbalized understanding and is in agreement to no further dysphagia testing. Pt albeit with decr intake at home PTA, has been tolerating soft/honey liquids without overt coughing, overt cough with thin water reported.  Risk for Aspiration: Moderate Other Related Risk Factors: History of pneumonia;History of dysphagia;History of GERD;Lethargy  Recommendations Solid Consistency: Regular (daughter ordering soft goods) Liquid Consistency: Nectar (thin water between meals) Medication Administration: Crushed with puree (follow with liquid) Compensations: Slow rate;Small sips/bites;Follow solids with liquid Postural Changes and/or Swallow Maneuvers: Upright 30-60 min after meal Oral Care Recommendations: Oral care BID Other Recommendations: Order thickener from pharmacy Follow up Recommendations: None        Individuals Consulted Consulted and Agree with Results and Recommendations: Patient;Family member/caregiver Family Member Consulted: Daughter  General  HPI: 77 year old female adm to [facility] 02/14/11, found to have pna.  PMH + for COPD, Afib, GERD, degenerative disk disease, dysphagia.  Pt CXR 02/17/11 with improved bilateral ATX + vascular congestion.  Pt has undergone several MBS studies in  the past, last being 2010 with recommendation of regular/thin.  [place] 2010 with slight narrowing at GE junction and 13 mm tablet would not pass through this area.  Daughter has been  thickening pts drinks at home to pudding, and providing thin water between meals.  Daughter reports pt coughs during intake of water, but will accept it. Pt last pna was 2 years ago per daughter. Daughter is ordering pt soft foods at hospital.  Intake has been poor in the last few month per daughter.  Type of Study: Bedside swallow evaluation Diet Prior to this Study: Regular Respiratory Status: Supplemental O2 delivered via (comment) (2 liters) Behavior/Cognition: Alert Oral Cavity - Dentition: Dentures, bottom;Dentures, top Vision: Functional for self-feeding Patient Positioning: Upright in bed Baseline Vocal Quality: Normal Volitional Cough: Strong Volitional Swallow: Able to elicit Ice chips: Not tested  Oral Motor/Sensory Function  Labial ROM: Within Functional Limits Labial Symmetry: Within Functional Limits Labial Strength: Within Functional Limits Labial Sensation: Within Functional Limits Lingual ROM: Within Functional Limits Lingual Symmetry: Within Functional Limits Lingual Strength: Within Functional Limits Lingual Sensation: Within Functional Limits Facial ROM: Within Functional Limits Facial Symmetry: Within Functional Limits Facial Strength: Within Functional Limits Facial Sensation: Within Functional Limits Velum:  (decr palatal elevation) Mandible: Within Functional Limits  Consistency Results  Ice Chips Ice chips: Not tested Other Comments: secondary to known aspiration   Thin Liquid Thin Liquid: Not tested Other Comments: secondary to known aspiration and daughter is providing thin water   Nectar Thick Liquid Nectar Thick Liquid: Impaired Presentation: Cup;Self Fed Pharyngeal Phase Impairments: Throat Clearing - Delayed  Honey Thick Liquid Honey Thick Liquid: Within functional limits Presentation: Cup;Self fed  Puree Puree: Within functional limits  Solid Solid: Within functional limits Presentation: Self Fed Other Comments: pt takes small bites  naturally   Chales Abrahams 02/17/2011,12:01 PM

## 2011-02-17 NOTE — Progress Notes (Signed)
Assessment complete, see for details. Patient resting quietly in bed. No acute distress noted. No needs, concerns. Daughter at bedside.

## 2011-02-17 NOTE — Progress Notes (Signed)
Pt's daughter reports patient wants to do PT but is in 10/10 pain in left hip and is exhausted from busy morning. RN notified of pain, meds requested. Repositioned pt and applied hot pack to L hip. Also noted INR is 5.5. Will follow and reattempt PT eval. Ralene Bathe Kistler PT 02/17/2011  [phone]

## 2011-02-17 NOTE — Progress Notes (Signed)
ANTICOAGULATION CONSULT NOTE - Follow Up Consult  Pharmacy Consult for Coumadin Indication: atrial fibrillation  Allergies  Allergen Reactions  . Sulfonamide Derivatives Hives  . Penicillins Itching and Rash    Patient Measurements: Height: 5\' 1"  (154.9 cm) Weight: 153 lb (69.4 kg) IBW/kg (Calculated) : 47.8   Vital Signs: Temp: 98.3 F (36.8 C) (12/24 0600) Temp src: Oral (12/24 0600) BP: 127/62 mmHg (12/24 0600) Pulse Rate: 65  (12/24 0600)  Labs:  Basename 02/17/11 0500 02/16/11 0600 02/15/11 0830 02/15/11 0307 02/15/11 0036 02/14/11 1630 02/14/11 0930  HGB -- -- -- -- -- -- 10.3*  HCT -- -- -- -- -- -- 32.3*  PLT -- -- -- -- -- -- 173  APTT -- -- -- -- -- -- --  LABPROT 50.9* 39.5* -- 27.0* -- -- --  INR 5.52* 3.99* -- 2.45* -- -- --  HEPARINUNFRC -- -- -- -- -- -- --  CREATININE -- 1.33* -- -- -- -- 1.19*  CKTOTAL -- -- 238* -- 251* 207* --  CKMB -- -- 4.5* -- 4.1* 3.2 --  TROPONINI -- -- <0.30 -- <0.30 <0.30 --   Estimated Creatinine Clearance: 22.5 ml/min (by C-G formula based on Cr of 1.33).   Medications:  Scheduled:     . albuterol  2.5 mg Nebulization TID  . aspirin EC  81 mg Oral QPM  . calcium carbonate  1 tablet Oral BID WC  . Chlorhexidine Gluconate Cloth  6 each Topical Q0600  . cholecalciferol  2,000 Units Oral Daily  . clindamycin (CLEOCIN) IV  600 mg Intravenous Q8H  . digoxin  0.125 mg Oral Daily  . diltiazem  240 mg Oral Daily  . Fluticasone-Salmeterol  1 puff Inhalation BID  . furosemide  40 mg Oral Daily  . ipratropium  0.5 mg Nebulization TID  . methylPREDNISolone (SOLU-MEDROL) injection  80 mg Intravenous Q12H  . mupirocin ointment  1 application Nasal BID  . nystatin  5 mL Oral QID  . pantoprazole  80 mg Oral Q1200  . polyethylene glycol  17 g Oral BID  . senna-docusate  1 tablet Oral BID  . sodium chloride  3 mL Intravenous Q12H  . Tamsulosin HCl  0.4 mg Oral Daily  . DISCONTD: albuterol  2.5 mg Nebulization Q6H  . DISCONTD:  ipratropium  0.5 mg Nebulization Q6H  . DISCONTD: methylPREDNISolone (SOLU-MEDROL) injection  80 mg Intravenous Q8H  . DISCONTD: polyethylene glycol  17 g Oral Daily   Infusions:   PRN: sodium chloride, acetaminophen, acetaminophen, albuterol, food thickener, guaiFENesin-dextromethorphan, ondansetron (ZOFRAN) IV, ondansetron, oxyCODONE, phenol, sodium chloride, DISCONTD: senna-docusate  Assessment:  89 YOF on chronic coumadin PTA for h/o afib  Home regimen:  2 mg po daily  INR supratherapeutic (5.52) today despite on home dose and 12/23 dose held  Of note, Solu-medrol started 12/23 and pt received Levaquin PTA.  No bleeding/complications reported   Scr up  Goal of Therapy:  INR 2-3   Plan:   No coumadin tonight  Will f/u in AM  Geoffry Paradise, PharmD.  Pager:  [phone] 7:18 AM

## 2011-02-17 NOTE — Progress Notes (Signed)
After great encouragement pt unable to void.  Foley cath placed as ordered clear yellow urine noted in bag 350 cc noted.

## 2011-02-17 NOTE — Progress Notes (Signed)
Patient still unable to void.  Bladder scan performed reading .  I&O cath performed with 350ccs of amber urine retured.  Order to do I&O x2 if needed.  This was #2.

## 2011-02-17 NOTE — Progress Notes (Signed)
CRITICAL VALUE ALERT  Critical value received:  INR 5.52 PT 50.9  Date of notification:  02/17/11  Time of notification:  0600  Critical value read back: Yes  Nurse who received alert:  Floreen Comber, RN  MD notified (1st page): Benedetto Coons  Time of first page:  0600  MD notified (2nd page):  Time of second page:  Responding MD:  Benedetto Coons  Time MD responded:  [phone]

## 2011-02-17 NOTE — Progress Notes (Signed)
UR CHART REVIEWED; B Jacki Couse RN, BSN, MHA 

## 2011-02-18 ENCOUNTER — Encounter (HOSPITAL_COMMUNITY): Payer: Self-pay | Admitting: *Deleted

## 2011-02-18 LAB — BASIC METABOLIC PANEL
Calcium: 8.9 mg/dL (ref 8.4–10.5)
GFR calc Af Amer: 41 mL/min — ABNORMAL LOW (ref >90)
GFR calc non Af Amer: 36 mL/min — ABNORMAL LOW (ref >90)
Glucose, Bld: 172 mg/dL — ABNORMAL HIGH (ref 70–99)
Sodium: 141 mEq/L (ref 135–145)

## 2011-02-18 LAB — CBC
Hemoglobin: 10.8 g/dL — ABNORMAL LOW (ref 12.0–15.0)
MCH: 29.9 pg (ref 26.0–34.0)
MCHC: 33.1 g/dL (ref 30.0–36.0)
Platelets: 212 10*3/uL (ref 150–400)
RDW: 14.4 % (ref 11.5–15.5)

## 2011-02-18 LAB — PROTIME-INR: Prothrombin Time: 48.1 seconds — ABNORMAL HIGH (ref 11.6–15.2)

## 2011-02-18 MED ORDER — POLYETHYLENE GLYCOL 3350 17 G PO PACK
17.0000 g | PACK | Freq: Every day | ORAL | Status: DC
  Administered 2011-02-19: 17 g via ORAL
  Filled 2011-02-18 (×2): qty 1

## 2011-02-18 MED ORDER — SENNOSIDES-DOCUSATE SODIUM 8.6-50 MG PO TABS
1.0000 | ORAL_TABLET | Freq: Every day | ORAL | Status: DC
  Administered 2011-02-19: 1 via ORAL
  Filled 2011-02-18 (×2): qty 1

## 2011-02-18 MED ORDER — PREDNISONE 50 MG PO TABS
60.0000 mg | ORAL_TABLET | Freq: Every day | ORAL | Status: DC
  Administered 2011-02-19 – 2011-02-20 (×2): 60 mg via ORAL
  Filled 2011-02-18 (×2): qty 1

## 2011-02-18 MED ORDER — LORAZEPAM 2 MG/ML IJ SOLN
0.5000 mg | Freq: Three times a day (TID) | INTRAMUSCULAR | Status: DC | PRN
  Filled 2011-02-18: qty 1

## 2011-02-18 NOTE — Progress Notes (Signed)
ANTICOAGULATION CONSULT NOTE - Follow Up Consult  Pharmacy Consult for Coumadin Indication: atrial fibrillation  Allergies  Allergen Reactions  . Sulfonamide Derivatives Hives  . Penicillins Itching and Rash    Patient Measurements: Height: 5\' 1"  (154.9 cm) Weight: 153 lb (69.4 kg) IBW/kg (Calculated) : 47.8   Vital Signs: Temp: 97.5 F (36.4 C) (12/25 0530) Temp src: Oral (12/25 0530) BP: 148/50 mmHg (12/25 0530) Pulse Rate: 59  (12/25 0530)  Labs:  Basename 02/18/11 0500 02/17/11 0500 02/16/11 0600 02/15/11 0830  HGB 10.8* -- -- --  HCT 32.6* -- -- --  PLT 212 -- -- --  APTT -- -- -- --  LABPROT 48.1* 50.9* 39.5* --  INR 5.13* 5.52* 3.99* --  HEPARINUNFRC -- -- -- --  CREATININE 1.24* -- 1.33* --  CKTOTAL -- -- -- 238*  CKMB -- -- -- 4.5*  TROPONINI -- -- -- <0.30   Estimated Creatinine Clearance: 24.2 ml/min (by C-G formula based on Cr of 1.24).   Medications:  Scheduled:     . albuterol  2.5 mg Nebulization TID  . aspirin EC  81 mg Oral QPM  . calcium carbonate  1 tablet Oral BID WC  . Chlorhexidine Gluconate Cloth  6 each Topical Q0600  . cholecalciferol  2,000 Units Oral Daily  . digoxin  0.125 mg Oral Daily  . diltiazem  240 mg Oral Daily  . Fluticasone-Salmeterol  1 puff Inhalation BID  . furosemide  40 mg Oral Daily  . ipratropium  0.5 mg Nebulization TID  . lactulose  20 g Oral BID  . moxifloxacin  400 mg Oral q1800  . mupirocin ointment  1 application Nasal BID  . nystatin  5 mL Oral QID  . oseltamivir  75 mg Oral BID  . pantoprazole  80 mg Oral Q1200  . polyethylene glycol  17 g Oral BID  . predniSONE  80 mg Oral Q breakfast  . senna-docusate  1 tablet Oral BID  . sodium chloride  3 mL Intravenous Q12H  . Tamsulosin HCl  0.4 mg Oral Daily  . DISCONTD: clindamycin (CLEOCIN) IV  600 mg Intravenous Q8H  . DISCONTD: methylPREDNISolone (SOLU-MEDROL) injection  80 mg Intravenous Q12H    Assessment:  89 YOF on chronic coumadin PTA for h/o  afib  Home regimen:  2 mg po daily  INR supratherapeutic, but slightly decreased, despite holding doses since 12/23  Noted drug interaction with Avelox, one dose given 12/21 and daily order started 12/24  No bleeding/complications reported   Goal of Therapy:  INR 2-3   Plan:   No coumadin tonight  Will f/u in AM   Lynann Beaver PharmD  Pager [phone] 02/18/2011 8:16 AM

## 2011-02-18 NOTE — Progress Notes (Signed)
Subjective: Feeling better; mild end-expiratory wheezing wheezing, no CP. Patient and family members reported significant improvement in her cough , overall breathing and sore throat. Patient have multiple bowel movements throughout the night and 30 morning.  Objective: Vital signs in last 24 hours: Temp:  [97.5 F (36.4 C)-98.2 F (36.8 C)] 97.5 F (36.4 C) (12/25 0530) Pulse Rate:  [59-65] 59  (12/25 0530) Resp:  [18-20] 18  (12/25 0530) BP: (124-148)/(47-61) 148/50 mmHg (12/25 0530) SpO2:  [92 %-97 %] 93 % (12/25 0947) Weight change:  Last BM Date: 02/13/11  Intake/Output from previous day: 12/24 0701 - 12/25 0700 In: 120 [P.O.:120] Out: 602 [Urine:600; Stool:2]     Physical Exam: General: Alert, awake, oriented x3, in no acute distress. HEENT: No bruits, no goiter. Significant improvement on her throat thrush. Heart: irregular/irregular; without murmurs, rubs, gallops. Lungs:good air movement; mild wheezing; no rhonchi or crackles. Abdomen: Soft, nontender, nondistended, positive bowel sounds. Extremities: No clubbing cyanosis or edema with positive pedal pulses. Neuro: Grossly intact, nonfocal.   Lab Results: Basic Metabolic Panel:  Basename 02/18/11 0500 02/16/11 0600  NA 141 137  K 4.0 3.9  CL 100 96  CO2 31 30  GLUCOSE 172* 198*  BUN 36* 35*  CREATININE 1.24* 1.33*  CALCIUM 8.9 9.0  MG -- --  PHOS -- --  Coagulation:  Basename 02/18/11 0500 02/17/11 0500  LABPROT 48.1* 50.9*  INR 5.13* 5.52*    Misc. Labs:  Recent Results (from the past 240 hour(s))  CULTURE, BLOOD (ROUTINE X 2)     Status: Normal (Preliminary result)   Collection Time   02/14/11  9:30 AM      Component Value Range Status Comment   Specimen Description BLOOD RIGHT WRIST   Final    Special Requests BOTTLES DRAWN AEROBIC AND ANAEROBIC 5CC   Final    Setup Time [id number]   Final    Culture     Final    Value:        BLOOD CULTURE RECEIVED NO GROWTH TO DATE CULTURE WILL BE HELD  FOR 5 DAYS BEFORE ISSUING A FINAL NEGATIVE REPORT   Report Status PENDING   Incomplete   CULTURE, BLOOD (ROUTINE X 2)     Status: Normal (Preliminary result)   Collection Time   02/14/11  9:30 AM      Component Value Range Status Comment   Specimen Description BLOOD LEFT WRIST   Final    Special Requests BOTTLES DRAWN AEROBIC AND ANAEROBIC 5CC   Final    Setup Time [id number]   Final    Culture     Final    Value:        BLOOD CULTURE RECEIVED NO GROWTH TO DATE CULTURE WILL BE HELD FOR 5 DAYS BEFORE ISSUING A FINAL NEGATIVE REPORT   Report Status PENDING   Incomplete   MRSA PCR SCREENING     Status: Abnormal   Collection Time   02/14/11  4:56 PM      Component Value Range Status Comment   MRSA by PCR POSITIVE (*) NEGATIVE  Final     Studies/Results: Dg Chest 2 View  02/17/2011  *RADIOLOGY REPORT*  Clinical Data: Shortness of breath  CHEST - 2 VIEW  Comparison: 02/14/2011  Findings: Left subclavian transvenous pacemaker stable. Improvement in the pulmonary vascular congestion.  Patchy atelectasis or infiltrates in the lower lobes, left greater than right, slightly improved.  Heart size upper limits normal. Tortuous atheromatous thoracic aorta.  Regional bones unremarkable.  IMPRESSION:  1.  Improving vascular congestion and bibasilar atelectasis.  Original Report Authenticated By: Thora Lance III, M.D.   Dg Hip Complete Left  02/17/2011  *RADIOLOGY REPORT*  Clinical Data: Left hip pain, known known injury  LEFT HIP - COMPLETE 2+ VIEW  Comparison: None.  Findings: The bones are diffusely osteopenic.  There is a moderate lumbar scoliosis convex to the right with associated degenerative change.  Surgical clips are noted in the right upper quadrant from prior cholecystectomy.  A moderate amount of feces is noted throughout the colon.  Both hips are in normal position with moderate degenerative joint disease for age.  No acute fracture is seen.  The pelvic rami are intact.  IMPRESSION:   1.  Osteopenia.  No acute hip fracture. 2.  Lumbar scoliosis with diffuse degenerative change.  Original Report Authenticated By: Juline Patch, M.D.    Medications: Scheduled Meds:    . albuterol  2.5 mg Nebulization TID  . aspirin EC  81 mg Oral QPM  . calcium carbonate  1 tablet Oral BID WC  . Chlorhexidine Gluconate Cloth  6 each Topical Q0600  . cholecalciferol  2,000 Units Oral Daily  . digoxin  0.125 mg Oral Daily  . diltiazem  240 mg Oral Daily  . Fluticasone-Salmeterol  1 puff Inhalation BID  . furosemide  40 mg Oral Daily  . ipratropium  0.5 mg Nebulization TID  . moxifloxacin  400 mg Oral q1800  . mupirocin ointment  1 application Nasal BID  . oseltamivir  75 mg Oral BID  . pantoprazole  80 mg Oral Q1200  . polyethylene glycol  17 g Oral Daily  . predniSONE  60 mg Oral Q breakfast  . senna-docusate  1 tablet Oral QHS  . sodium chloride  3 mL Intravenous Q12H  . Tamsulosin HCl  0.4 mg Oral Daily  . DISCONTD: clindamycin (CLEOCIN) IV  600 mg Intravenous Q8H  . DISCONTD: lactulose  20 g Oral BID  . DISCONTD: methylPREDNISolone (SOLU-MEDROL) injection  80 mg Intravenous Q12H  . DISCONTD: nystatin  5 mL Oral QID  . DISCONTD: polyethylene glycol  17 g Oral BID  . DISCONTD: predniSONE  80 mg Oral Q breakfast  . DISCONTD: senna-docusate  1 tablet Oral BID   Continuous Infusions:  PRN Meds:.sodium chloride, acetaminophen, acetaminophen, albuterol, food thickener, guaiFENesin-dextromethorphan, LORazepam, morphine injection, ondansetron (ZOFRAN) IV, ondansetron, oxyCODONE, phenol, sodium chloride  Assessment/Plan: 1-Respiratory failure, acute-on-chronic: Secondary to COPD exacerbation due to 2 influenza and pneumonia. Now tapering steroids, continue Tamiflu, continue antibiotics (now changed to by mouth; she will use it for 5 more days). Good oxygen saturation and significant improvement in her respiratory distress. Most likely she will be able to go back home over the next  24-48 hours.  2-Atrial fibrillation:Continue digoxin and diltiazem. Coumadin per pharmacy. INR supratherapeutic.  3-Dysphagia:Continue thick liquids.  4-GERD: continue PPI.  5-MRSA: continue mupirocin and chlorhexidine cloths.  6-DVT: on coumadin.   7-constipation: Continue miralax and sennokot. Patient had 2 good BM's today.  8-sore throat secondary to thrush: She has completed treatment with nystatin for 3 days; thrush infection resolve and sore throat follow better. Continue oral care.  9-deconditioning: PT evaluation and treatment has been requested; will follow their recommendation.  10-urinary retention: Continue Flomax; tried voiding trial one more time.  11-Influenza: Continue Tamiflu.  Disposition: Patient with significant overall improvement in her condition. At this point waiting physical therapy for recommendations and needs and to determine if she will benefit  or not of inpatient rehabilitation to 2 physical deconditioning prior to go back home. We are also doing voiding trial to prevent send her out with a Foley. Patient may be able to be discharged over the next 24-48 hours.   LOS: 4 days   [place] [facility] [phone]  02/18/2011, 11:47 AM

## 2011-02-19 LAB — PROTIME-INR: Prothrombin Time: 35.3 seconds — ABNORMAL HIGH (ref 11.6–15.2)

## 2011-02-19 MED ORDER — POLYETHYLENE GLYCOL 3350 17 G PO PACK
17.0000 g | PACK | Freq: Every day | ORAL | Status: DC
  Administered 2011-02-19 – 2011-02-20 (×2): 17 g via ORAL
  Filled 2011-02-19 (×2): qty 1

## 2011-02-19 NOTE — Progress Notes (Signed)
ANTICOAGULATION CONSULT NOTE - Follow Up Consult  Pharmacy Consult for Coumadin Indication: atrial fibrillation  Allergies  Allergen Reactions  . Sulfonamide Derivatives Hives  . Penicillins Itching and Rash    Patient Measurements: Height: 5\' 1"  (154.9 cm) Weight: 153 lb (69.4 kg) IBW/kg (Calculated) : 47.8   Vital Signs: Temp: 98.2 F (36.8 C) (12/25 2216) Temp src: Oral (12/25 2216) BP: 151/46 mmHg (12/25 2216) Pulse Rate: 61  (12/25 2216)  Labs:  Basename 02/19/11 0511 02/18/11 0500 02/17/11 0500  HGB -- 10.8* --  HCT -- 32.6* --  PLT -- 212 --  APTT -- -- --  LABPROT 35.3* 48.1* 50.9*  INR 3.46* 5.13* 5.52*  HEPARINUNFRC -- -- --  CREATININE -- 1.24* --  CKTOTAL -- -- --  CKMB -- -- --  TROPONINI -- -- --   Estimated Creatinine Clearance: 24.2 ml/min (by C-G formula based on Cr of 1.24).   Medications:  Scheduled:     . albuterol  2.5 mg Nebulization TID  . aspirin EC  81 mg Oral QPM  . calcium carbonate  1 tablet Oral BID WC  . Chlorhexidine Gluconate Cloth  6 each Topical Q0600  . cholecalciferol  2,000 Units Oral Daily  . digoxin  0.125 mg Oral Daily  . diltiazem  240 mg Oral Daily  . Fluticasone-Salmeterol  1 puff Inhalation BID  . furosemide  40 mg Oral Daily  . ipratropium  0.5 mg Nebulization TID  . moxifloxacin  400 mg Oral q1800  . mupirocin ointment  1 application Nasal BID  . oseltamivir  75 mg Oral BID  . pantoprazole  80 mg Oral Q1200  . polyethylene glycol  17 g Oral Daily  . predniSONE  60 mg Oral Q breakfast  . senna-docusate  1 tablet Oral QHS  . sodium chloride  3 mL Intravenous Q12H  . Tamsulosin HCl  0.4 mg Oral Daily  . DISCONTD: lactulose  20 g Oral BID  . DISCONTD: nystatin  5 mL Oral QID  . DISCONTD: polyethylene glycol  17 g Oral BID  . DISCONTD: predniSONE  80 mg Oral Q breakfast  . DISCONTD: senna-docusate  1 tablet Oral BID   Patient's usual warfarin dosage is reportedly 2mg  daily.   Assessment:  77 y/o F on  chronic warfarin for h/o afib  INR supratherapeutic, but improving after holding warfarin x 3 days.  Etiology likely multifactorial:  acute illness, concomitant Avelox, reduced dietary intake.    No bleeding reported.  Goal of Therapy:  INR 2-3   Plan:   No warfarin today  INR daily while inpatient   Elie Goody, PharmD  [phone] 02/19/2011 6:40 AM

## 2011-02-19 NOTE — Plan of Care (Signed)
Problem: Phase I Progression Outcomes Goal: OOB as tolerated unless otherwise ordered Outcome: Progressing O2 sats dropped to 87% on 2L O2 with activity, during PT evaluation.

## 2011-02-19 NOTE — Progress Notes (Signed)
Physical Therapy Evaluation Patient Details Name: Peggy Harvey MRN: [id number] DOB: 30-Nov-1913 Today's Date: 02/19/2011 Time: 0920-1000  Eval II Problem List:  Patient Active Problem List  Diagnoses  . HYPERTENSION  . Atrial fibrillation  . C O P D  . DYSPNEA  . COUGH  . Respiratory failure, acute-on-chronic  . Flu-like symptoms  . Dysphagia    Past Medical History:  Past Medical History  Diagnosis Date  . Femoral artery occlusion   . Coronary artery disease   . COPD (chronic obstructive pulmonary disease)   . Atrial fibrillation   . Dysphagia     pills have to be crushed and given with applesauce  . Arthritis   . Degenerative arthritis of spine   . Pneumonia     hx aspiraton pna  . Bleeding nose Dec. 2012   Past Surgical History:  Past Surgical History  Procedure Date  . Pacemaker insertion   . Abdominal hysterectomy   . Appendectomy   . Stomach surgery 1940's    stomach surgery due to gangrene  . Cholecystectomy     PT Assessment/Plan/Recommendation PT Assessment Clinical Impression Statement: Pt presents with diagnosis of respiratory failure and general weakness. Pt will benefit from skilled PT in the acute care setting to improve strength, activity tolerance, gait and overall functional mobility in preperation for D/C home with daughter.  PT Recommendation/Assessment: Patient will need skilled PT in the acute care venue PT Problem List: Decreased strength;Decreased activity tolerance;Decreased mobility PT Therapy Diagnosis : Difficulty walking;Generalized weakness PT Plan PT Frequency: Min 3X/week PT Treatment/Interventions: DME instruction;Gait training;Functional mobility training;Therapeutic activities;Therapeutic exercise;Patient/family education PT Recommendation Recommendations for Other Services: OT consult Follow Up Recommendations: Home health PT;24 hour supervision/assistance; HHOT Equipment Recommended: None recommended by PT PT Goals    Acute Rehab PT Goals PT Goal Formulation: With patient Pt will go Supine/Side to Sit: with supervision PT Goal: Supine/Side to Sit - Progress: Not met Pt will go Sit to Supine/Side: with supervision PT Goal: Sit to Supine/Side - Progress: Not met Pt will go Sit to Stand: with supervision PT Goal: Sit to Stand - Progress: Not met Pt will Ambulate: with supervision;with rolling walker;16 - 50 feet PT Goal: Ambulate - Progress: Not met Pt will Perform Home Exercise Program: with supervision, verbal cues required/provided PT Goal: Perform Home Exercise Program - Progress: Not met  PT Evaluation Precautions/Restrictions  Precautions Precautions: Fall Required Braces or Orthoses: No Restrictions Weight Bearing Restrictions: No Prior Functioning  Home Living Lives With: Daughter Receives Help From: Family Type of Home: House Home Layout: One level Home Access: Level entry Bathroom Shower/Tub: Engineer, manufacturing systems: Standard Home Adaptive Equipment: Walker - rolling;Tub transfer bench;Raised toilet seat with rails Prior Function Level of Independence: Needs assistance with ADLs;Needs assistance with homemaking;Requires assistive device for independence Driving: No Cognition Cognition Arousal/Alertness: Awake/alert Overall Cognitive Status: Appears within functional limits for tasks assessed Orientation Level: Oriented X4 Sensation/Coordination Sensation Light Touch: Appears Intact Coordination Gross Motor Movements are Fluid and Coordinated: Yes Extremity Assessment RLE Assessment RLE Assessment: Exceptions to [facility] RLE Strength RLE Overall Strength Comments: Strength > or = 3+/5 LLE Assessment LLE Assessment: Exceptions to [facility] LLE Strength LLE Overall Strength Comments: Strength > or = 3+/5 Mobility (including Balance) Bed Mobility Bed Mobility: Yes Supine to Sit: HOB elevated (Comment degrees);With rails;4: Min assist Supine to Sit Details (indicate cue type  and reason): VCs safety, technique, hand placement. Increased time. Assist for trunk to upright.  Sit to Supine - Right: 3:  Mod assist;HOB flat Sit to Supine - Right Details (indicate cue type and reason): Increased time. Assist for bilateral LEs onto bed.  Transfers Transfers: Yes Sit to Stand: 3: Mod assist;From bed;From chair/3-in-1;With armrests;From elevated surface;With upper extremity assist Sit to Stand Details (indicate cue type and reason): VCs safety, technique, hand placement. MOD assist from bed/MIN assist from recliner. Poor anterior weight shifting.  Stand to Sit: 4: Min assist;With upper extremity assist;With armrests;To bed;To chair/3-in-1 Stand to Sit Details: VCs safety, technique, hand placement.  Stand Pivot Transfers: 4: Min assist Stand Pivot Transfer Details (indicate cue type and reason): with RW. from recliner to bed.  Ambulation/Gait Ambulation/Gait: Yes Ambulation/Gait Assistance: 4: Min assist Ambulation/Gait Assistance Details (indicate cue type and reason): VCs safety, posture. Some difficulty advancing L LE initially. Fatigues easily. Seated rest break in recliner before assisting pt back to bed. Sats dropped to 87% on 2LO2.  Ambulation Distance (Feet): 15 Feet Assistive device: Rolling walker Gait Pattern: Trunk flexed;Decreased step length - left;Decreased step length - right  Posture/Postural Control Posture/Postural Control: No significant limitations Balance Balance Assessed: No (Weight intermittently shifted posteriorly ) Exercise    End of Session PT - End of Session Equipment Utilized During Treatment: Gait belt Activity Tolerance: Patient limited by fatigue Patient left: in bed;with call bell in reach;with family/visitor present Nurse Communication: Mobility status for transfers (Requested nursing assist with OOB>chair daily for meals) General Behavior During Session: [facility] for tasks performed Cognition: [facility] for tasks performed  Rebeca Alert  [facility] 02/19/2011, 10:21 AM [phone]

## 2011-02-19 NOTE — Progress Notes (Signed)
Subjective: Per daughter doing better today.  Denies any pain.  Breathing better.  Objective: Vital signs in last 24 hours: Filed Vitals:   02/18/11 2216 02/19/11 0600 02/19/11 0912 02/19/11 1000  BP: 151/46 133/64  150/57  Pulse: 61 60  55  Temp: 98.2 F (36.8 C) 98.1 F (36.7 C)    TempSrc: Oral Oral    Resp: 20 20    Height:      Weight:      SpO2: 96% 96% 95% 91%   Weight change:   Intake/Output Summary (Last 24 hours) at 02/19/11 1417 Last data filed at 02/19/11 0600  Gross per 24 hour  Intake    100 ml  Output    251 ml  Net   -151 ml    Physical Exam: General: Awake, Oriented, No acute distress. HEENT: EOMI. Neck: Supple CV: S1 and S2 Lungs: Good air movement, scattered crackles at bases bilaterally. Abdomen: Soft, Nontender, Nondistended, +bowel sounds. Ext: Good pulses. Trace edema.  Lab Results:  Basename 02/18/11 0500  NA 141  K 4.0  CL 100  CO2 31  GLUCOSE 172*  BUN 36*  CREATININE 1.24*  CALCIUM 8.9  MG --  PHOS --   No results found for this basename: AST:2,ALT:2,ALKPHOS:2,BILITOT:2,PROT:2,ALBUMIN:2 in the last 72 hours No results found for this basename: LIPASE:2,AMYLASE:2 in the last 72 hours  Basename 02/18/11 0500  WBC 7.6  NEUTROABS --  HGB 10.8*  HCT 32.6*  MCV 90.3  PLT 212   No results found for this basename: CKTOTAL:3,CKMB:3,CKMBINDEX:3,TROPONINI:3 in the last 72 hours No components found with this basename: POCBNP:3 No results found for this basename: DDIMER:2 in the last 72 hours No results found for this basename: HGBA1C:2 in the last 72 hours No results found for this basename: CHOL:2,HDL:2,LDLCALC:2,TRIG:2,CHOLHDL:2,LDLDIRECT:2 in the last 72 hours No results found for this basename: TSH,T4TOTAL,FREET3,T3FREE,THYROIDAB in the last 72 hours No results found for this basename: VITAMINB12:2,FOLATE:2,FERRITIN:2,TIBC:2,IRON:2,RETICCTPCT:2 in the last 72 hours  Micro Results: Recent Results (from the past 240 hour(s))    CULTURE, BLOOD (ROUTINE X 2)     Status: Normal (Preliminary result)   Collection Time   02/14/11  9:30 AM      Component Value Range Status Comment   Specimen Description BLOOD RIGHT WRIST   Final    Special Requests BOTTLES DRAWN AEROBIC AND ANAEROBIC 5CC   Final    Setup Time [id number]   Final    Culture     Final    Value:        BLOOD CULTURE RECEIVED NO GROWTH TO DATE CULTURE WILL BE HELD FOR 5 DAYS BEFORE ISSUING A FINAL NEGATIVE REPORT   Report Status PENDING   Incomplete   CULTURE, BLOOD (ROUTINE X 2)     Status: Normal (Preliminary result)   Collection Time   02/14/11  9:30 AM      Component Value Range Status Comment   Specimen Description BLOOD LEFT WRIST   Final    Special Requests BOTTLES DRAWN AEROBIC AND ANAEROBIC 5CC   Final    Setup Time [id number]   Final    Culture     Final    Value:        BLOOD CULTURE RECEIVED NO GROWTH TO DATE CULTURE WILL BE HELD FOR 5 DAYS BEFORE ISSUING A FINAL NEGATIVE REPORT   Report Status PENDING   Incomplete   MRSA PCR SCREENING     Status: Abnormal   Collection Time   02/14/11  4:56  PM      Component Value Range Status Comment   MRSA by PCR POSITIVE (*) NEGATIVE  Final     Studies/Results: Dg Hip Complete Left  02/17/2011  *RADIOLOGY REPORT*  Clinical Data: Left hip pain, known known injury  LEFT HIP - COMPLETE 2+ VIEW  Comparison: None.  Findings: The bones are diffusely osteopenic.  There is a moderate lumbar scoliosis convex to the right with associated degenerative change.  Surgical clips are noted in the right upper quadrant from prior cholecystectomy.  A moderate amount of feces is noted throughout the colon.  Both hips are in normal position with moderate degenerative joint disease for age.  No acute fracture is seen.  The pelvic rami are intact.  IMPRESSION:  1.  Osteopenia.  No acute hip fracture. 2.  Lumbar scoliosis with diffuse degenerative change.  Original Report Authenticated By: Juline Patch, M.D.     Medications: I have reviewed the patient's current medications. Scheduled Meds:   . albuterol  2.5 mg Nebulization TID  . aspirin EC  81 mg Oral QPM  . calcium carbonate  1 tablet Oral BID WC  . Chlorhexidine Gluconate Cloth  6 each Topical Q0600  . cholecalciferol  2,000 Units Oral Daily  . digoxin  0.125 mg Oral Daily  . diltiazem  240 mg Oral Daily  . Fluticasone-Salmeterol  1 puff Inhalation BID  . furosemide  40 mg Oral Daily  . ipratropium  0.5 mg Nebulization TID  . moxifloxacin  400 mg Oral q1800  . mupirocin ointment  1 application Nasal BID  . oseltamivir  75 mg Oral BID  . pantoprazole  80 mg Oral Q1200  . polyethylene glycol  17 g Oral Daily  . predniSONE  60 mg Oral Q breakfast  . senna-docusate  1 tablet Oral QHS  . sodium chloride  3 mL Intravenous Q12H  . Tamsulosin HCl  0.4 mg Oral Daily   Continuous Infusions:  PRN Meds:.sodium chloride, acetaminophen, acetaminophen, albuterol, food thickener, guaiFENesin-dextromethorphan, LORazepam, morphine injection, ondansetron (ZOFRAN) IV, ondansetron, oxyCODONE, phenol, sodium chloride  Assessment/Plan: 1. Respiratory failure, acute-on-chronic: Secondary to COPD exacerbation due to influenza and pneumonia.  Continue steroid taper, continue Tamiflu, continue antibiotics (moxifloxacin orally).  Antibiotics till 02/22/2011. Good oxygen saturation and significant improvement in her respiratory distress. Will continue oxygen 2L (home is 3L) at discharge.   2. Atrial fibrillation:Continue digoxin and diltiazem. Coumadin per pharmacy. INR supratherapeutic.   3. Dysphagia. Continue thick liquids. Continue solid (regular) consistency as per speech therapy.  4. GERD: continue PPI.   5. MRSA: continue mupirocin and chlorhexidine cloths.   6. DVT: on coumadin.  INR still supratherapeutic but improved.  7. Constipation: Continue miralax and sennokot. Patient had 2 good BM's today.   8. Sore throat secondary to thrush:  Completed treatment with nystatin for 3 days; thrush infection resolve and sore throat follow better. Continue oral care.   9. Deconditioning: PT recommending HHPT and OT with 24 hours supervision.  10. Urinary retention: Continue Flomax   11.  Upper respiratory viral infection/Influenza: Continue Tamiflu.   12.  Disposition: Plan for discharge to home with home health PT and OT in the next 24 hours if the patient continues to improve.   LOS: 5 days  Jaymee Tilson A, MD 02/19/2011, 2:17 PM

## 2011-02-20 LAB — CULTURE, BLOOD (ROUTINE X 2)
Culture  Setup Time: 201212211401
Culture  Setup Time: 201212211402
Culture: NO GROWTH

## 2011-02-20 LAB — PROTIME-INR: INR: 2.85 — ABNORMAL HIGH (ref 0.00–1.49)

## 2011-02-20 MED ORDER — ALBUTEROL SULFATE (5 MG/ML) 0.5% IN NEBU
INHALATION_SOLUTION | RESPIRATORY_TRACT | Status: AC
  Administered 2011-02-20: 2.5 mg via RESPIRATORY_TRACT
  Filled 2011-02-20: qty 0.5

## 2011-02-20 MED ORDER — PHENOL 1.4 % MT LIQD: 2.0000 | OROMUCOSAL | Status: DC | PRN

## 2011-02-20 MED ORDER — STARCH (THICKENING) PO POWD: ORAL | Status: DC

## 2011-02-20 MED ORDER — LORAZEPAM 1 MG PO TABS: 0.5000 mg | ORAL_TABLET | Freq: Two times a day (BID) | ORAL | Status: AC | PRN

## 2011-02-20 MED ORDER — SENNOSIDES-DOCUSATE SODIUM 8.6-50 MG PO TABS: 1.0000 | ORAL_TABLET | Freq: Every day | ORAL | Status: DC

## 2011-02-20 MED ORDER — FLUTICASONE-SALMETEROL 250-50 MCG/DOSE IN AEPB: 1.0000 | INHALATION_SPRAY | Freq: Two times a day (BID) | RESPIRATORY_TRACT | Status: DC

## 2011-02-20 MED ORDER — ALBUTEROL SULFATE (5 MG/ML) 0.5% IN NEBU: 2.5000 mg | INHALATION_SOLUTION | Freq: Four times a day (QID) | RESPIRATORY_TRACT | Status: DC | PRN

## 2011-02-20 MED ORDER — IPRATROPIUM BROMIDE 0.02 % IN SOLN: 0.5000 mg | Freq: Four times a day (QID) | RESPIRATORY_TRACT | Status: DC | PRN

## 2011-02-20 MED ORDER — OSELTAMIVIR PHOSPHATE 75 MG PO CAPS: 75.0000 mg | ORAL_CAPSULE | Freq: Two times a day (BID) | ORAL | Status: DC

## 2011-02-20 MED ORDER — PREDNISONE 10 MG PO TABS: ORAL_TABLET | ORAL | Status: DC

## 2011-02-20 MED ORDER — POLYETHYLENE GLYCOL 3350 17 G PO PACK: 17.0000 g | PACK | Freq: Every day | ORAL | Status: AC

## 2011-02-20 MED ORDER — WARFARIN 0.5 MG HALF TABLET
0.5000 mg | ORAL_TABLET | Freq: Once | ORAL | Status: DC
  Filled 2011-02-20: qty 1

## 2011-02-20 MED ORDER — MOXIFLOXACIN HCL 400 MG PO TABS: 400.0000 mg | ORAL_TABLET | Freq: Every day | ORAL | Status: AC

## 2011-02-20 NOTE — Progress Notes (Signed)
Pt daughter requesting something for her mothers anxiety.  Text page sent to Dr. Betti Cruz.

## 2011-02-20 NOTE — Discharge Summary (Addendum)
Discharge Summary  Peggy Harvey MR#: [id number]  DOB:07-17-1913  Date of Admission: 02/14/2011 Date of Discharge: 02/20/2011  Patient's PCP: Gweneth Dimitri, MD, MD  Attending Physician:Chananya Canizalez A  Consults: None  Discharge Diagnoses: Principal Problem:  *Respiratory failure, acute-on-chronic Active Problems:  Atrial fibrillation  C O P D  DYSPNEA  Flu-like symptoms  Dysphagia  Brief Admitting History and Physical 77 year old Caucasian female with history of A. fib on chronic anticoagulation, status post permanent pacemaker implantation for tachybradycardia syndrome, history of chronic respiratory failure secondary to COPD on 3 L of oxygen at home continuously was admitted on December 21 of thousand 12 with shortness of breath and cough.  Discharge Medications Current Discharge Medication List    START taking these medications   Details  albuterol (PROVENTIL) (5 MG/ML) 0.5% nebulizer solution Take 0.5 mLs (2.5 mg total) by nebulization every 6 (six) hours as needed for wheezing or shortness of breath. Qty: 20 mL, Refills: 0    Fluticasone-Salmeterol (ADVAIR) 250-50 MCG/DOSE AEPB Inhale 1 puff into the lungs 2 (two) times daily. Qty: 60 each, Refills: 0    food thickener (THICK IT) POWD With water/liquids. Qty: 1 Can, Refills: 0    ipratropium (ATROVENT) 0.02 % nebulizer solution Take 2.5 mLs (0.5 mg total) by nebulization every 6 (six) hours as needed for wheezing. Qty: 75 mL, Refills: 0    LORazepam (ATIVAN) 1 MG tablet Take 0.5 tablets (0.5 mg total) by mouth 2 (two) times daily as needed for anxiety. Qty: 14 tablet, Refills: 0    moxifloxacin (AVELOX) 400 MG tablet Take 1 tablet (400 mg total) by mouth daily at 6 PM. Qty: 3 tablet, Refills: 0    oseltamivir (TAMIFLU) 75 MG capsule Take 1 capsule (75 mg total) by mouth 2 (two) times daily. Qty: 3 capsule, Refills: 0    phenol (CHLORASEPTIC) 1.4 % LIQD Use as directed 2 sprays in the mouth or throat every 4  (four) hours as needed (For sore throat.). Qty: 1 Bottle, Refills: 0    polyethylene glycol (MIRALAX / GLYCOLAX) packet Take 17 g by mouth daily. Qty: 30 each, Refills: 0    predniSONE (DELTASONE) 10 MG tablet Take 60 mg po qday x2 days, then 40 mg po qday x2 days, then 20 mg po qday x2 days, then 10 mg po qday x2 days, then 5 mg po qday x2 days then discontinue. Qty: 28 tablet, Refills: 0    senna-docusate (SENOKOT-S) 8.6-50 MG per tablet Take 1 tablet by mouth at bedtime. Hold for diarrhea. Qty: 30 tablet, Refills: 0      CONTINUE these medications which have NOT CHANGED   Details  Ascorbic Acid (VITAMIN C) 1000 MG tablet Take 4,000 mg by mouth daily.      aspirin 81 MG tablet Take 81 mg by mouth every evening.     bethanechol (URECHOLINE) 10 MG tablet Take 10 mg by mouth 3 (three) times daily.      Calcium Carbonate-Vitamin D (OS-CAL 500 + D) 500-500 MG-UNIT CHEW Chew 1 tablet by mouth 2 (two) times daily.      Cholecalciferol (VITAMIN D) 2000 UNITS tablet Take 2,000 Units by mouth daily.      CRANBERRY EXTRACT PO Take 1 tablet by mouth 2 (two) times daily.      digoxin (LANOXIN) 0.25 MG tablet Take 0.125 mcg by mouth every morning. Pt takes (1/2 tab) for 0.149mcg dose    diltiazem (DILACOR XR) 240 MG 24 hr capsule Take 240 mg by mouth daily.  esomeprazole (NEXIUM) 40 MG capsule Take 40 mg by mouth daily before breakfast.      furosemide (LASIX) 40 MG tablet Take 40 mg by mouth every morning.     guaiFENesin (MUCINEX) 600 MG 12 hr tablet Take 600 mg by mouth 2 (two) times daily.      guaiFENesin (ROBITUSSIN) 100 MG/5ML SOLN Take 5 mLs by mouth every 4 (four) hours as needed. cough     hydrochlorothiazide (HYDRODIURIL) 12.5 MG tablet Take 12.5 mg by mouth daily.      HYDROcodone-acetaminophen (NORCO) 5-325 MG per tablet Take 1 tablet by mouth every 6 (six) hours as needed. pain     IRON PO Take 1 tablet by mouth daily.      Multiple Vitamins-Minerals (MACULAR VITAMIN  BENEFIT PO) Take 2 tablets by mouth.      Nutritional Supplements (ESTROVEN PO) Take 1 tablet by mouth daily.      promethazine (PHENERGAN) 25 MG tablet Take 25 mg by mouth every 6 (six) hours as needed. nausea       STOP taking these medications     doxycycline (VIBRAMYCIN) 50 MG capsule      warfarin (COUMADIN) 2 MG tablet         Hospital Course: 1. Respiratory failure, acute-on-chronic: Secondary to COPD exacerbation due to influenza and pneumonia.  Start the patient on steroid which will be tapered at discharge.  Started the patient on Tamiflu and patient's influenza A PCR on December 22 of 2012 was positive.  Patient was started on moxifloxacin for COPD exacerbation and pneumonia.  Patient will continue antibiotics until 02/22/2011 to complete a course.  During the course of hospital stay patient's oxygenation improved and also had significant improvement in her respiratory distress. Continue oxygen 3 L at discharge which is how much she takes at home.   2. Atrial fibrillation:Continue digoxin and diltiazem. Coumadin per pharmacy.  INR initially was supratherapeutic with a peak INR of 5.52 and Coumadin was initially held.  At discharge INR is 2.85.  Patient will have PT/INR checked on 02/21/2011 by home health.  Results of the PT/INR to be sent to patient's cardiologist, Roxanne Mins, PA.   3. Dysphagia. Continue thick liquids. Continue solid (regular) consistency as per speech therapy.  Will arrange for home health speech therapy followup at discharge.  4. GERD: continue PPI.   5. MRSA: Was on mupirocin and chlorhexidine cloths during the course of hospital stay.   6. Constipation: Continue miralax and sennokot. Resolved.  7. Sore throat secondary to thrush: Completed treatment with nystatin for 3 days; thrush infection resolve and sore throat follow better. Continue oral care.   8. Deconditioning: PT recommending HHPT and OT with 24 hours supervision.  Which will be arranged  at discharge.  9. Urinary retention: Was on Flomax during the course of hospital stay.  At discharge will resume Bethanechol.  10.  Upper respiratory viral infection/Influenza: Continue Tamiflu until 02/21/2011 to complete a five-day course.    Day of Discharge BP 135/59  Pulse 65  Temp(Src) 97.5 F (36.4 C) (Oral)  Resp 20  Ht 5\' 1"  (1.549 m)  Wt 69.4 kg (153 lb)  BMI 28.91 kg/m2  SpO2 91%  Results for orders placed during the hospital encounter of 02/14/11 (from the past 48 hour(s))  PROTIME-INR     Status: Abnormal   Collection Time   02/19/11  5:11 AM      Component Value Range Comment   Prothrombin Time 35.3 (*) 11.6 - 15.2 (  seconds)    INR 3.46 (*) 0.00 - 1.49    PROTIME-INR     Status: Abnormal   Collection Time   02/20/11  4:50 AM      Component Value Range Comment   Prothrombin Time 30.4 (*) 11.6 - 15.2 (seconds)    INR 2.85 (*) 0.00 - 1.49      Dg Chest 2 View  02/17/2011  *RADIOLOGY REPORT*  Clinical Data: Shortness of breath  CHEST - 2 VIEW  Comparison: 02/14/2011  Findings: Left subclavian transvenous pacemaker stable. Improvement in the pulmonary vascular congestion.  Patchy atelectasis or infiltrates in the lower lobes, left greater than right, slightly improved.  Heart size upper limits normal. Tortuous atheromatous thoracic aorta.  Regional bones unremarkable.  IMPRESSION:  1.  Improving vascular congestion and bibasilar atelectasis.  Original Report Authenticated By: Thora Lance III, M.D.   Dg Hip Complete Left  02/17/2011  *RADIOLOGY REPORT*  Clinical Data: Left hip pain, known known injury  LEFT HIP - COMPLETE 2+ VIEW  Comparison: None.  Findings: The bones are diffusely osteopenic.  There is a moderate lumbar scoliosis convex to the right with associated degenerative change.  Surgical clips are noted in the right upper quadrant from prior cholecystectomy.  A moderate amount of feces is noted throughout the colon.  Both hips are in normal position with  moderate degenerative joint disease for age.  No acute fracture is seen.  The pelvic rami are intact.  IMPRESSION:  1.  Osteopenia.  No acute hip fracture. 2.  Lumbar scoliosis with diffuse degenerative change.  Original Report Authenticated By: Juline Patch, M.D.   Dg Chest Portable 1 View  02/14/2011  *RADIOLOGY REPORT*  Clinical Data: Shortness of breath, cough, fever and weakness, former smoker  PORTABLE CHEST - 1 VIEW  Comparison: Chest x-ray of 02/13/2011  Findings: There is cardiomegaly present with mild pulmonary vascular congestion.  No focal infiltrate is seen.  A single lead permanent pacemaker is present.  No acute bony abnormality is noted.  IMPRESSION: Cardiomegaly and mild pulmonary vascular congestion with basilar atelectasis.  Original Report Authenticated By: Juline Patch, M.D.     Disposition: Home with home health.  Diet: Heart healthy diet.  Activity: Resume as tolerated per physical therapy.   Follow-up Appts: Discharge Orders    Future Orders Please Complete By Expires   Diet - low sodium heart healthy      Increase activity slowly      Discharge instructions      Comments:   Followup with MCNEILL,WENDY, MD (PCP) in 1 week. Home health RN to draw PT/INR on 02/21/2011 and have results sent to patient's cardiologist.      TESTS THAT NEED FOLLOW-UP None  Time spent on discharge, talking to the patient, and coordinating care: 45 mins.   Signed: Cristal Ford, MD 02/20/2011, 11:02 AM

## 2011-02-20 NOTE — Progress Notes (Signed)
Physical Therapy Treatment Patient Details Name: Peggy Harvey MRN: [id number] DOB: Dec 27, 1913 Today's Date: 02/20/2011 Time: 1030-1059  1TA, 1G PT Assessment/Plan  PT - Assessment/Plan Comments on Treatment Session: Pt discharging home today with daughter providing 24 hour care. Daughter with some concerns-recommended limited mobility/ambulation initially and for daughter to have pt use 3n1 as needed, until [facility] comes out and begins working with pt.  PT Plan: Discharge plan remains appropriate Follow Up Recommendations: Home health PT/OT with 24 hour supervision Equipment Recommended: None recommended by PT PT Goals  Acute Rehab PT Goals PT Goal: Sit to Stand - Progress: Progressing toward goal PT Goal: Ambulate - Progress: Progressing toward goal PT Goal: Perform Home Exercise Program - Progress: Progressing toward goal  PT Treatment Precautions/Restrictions  Precautions Precautions: Fall Required Braces or Orthoses: No Restrictions Weight Bearing Restrictions: No Mobility (including Balance) Bed Mobility Bed Mobility: No Transfers Transfers: Yes Sit to Stand: 4: Min assist;From chair/3-in-1;With armrests;With upper extremity assist Sit to Stand Details (indicate cue type and reason): VCs safety, technique, hand placement. Assist to rise, stabilize Stand to Sit: 4: Min assist;To chair/3-in-1;With armrests;With upper extremity assist Stand to Sit Details: VCs safety, technique, hand placement. Assist to control descent Ambulation/Gait Ambulation/Gait: Yes Ambulation/Gait Assistance Details (indicate cue type and reason): VCs safety. Pt fatigues easily. Attempted ambulation in hallway but pt unable to breathe well with droplet mask on (pt on droplet precatutions for flu) Ambulation Distance (Feet): 7 Feet (x 2. Seated rest break for toileting.) Assistive device: Rolling walker Gait Pattern: Step-through pattern;Decreased stride length;Decreased step length - left;Decreased step  length - right;Trunk flexed    Exercise  General Exercises - Lower Extremity Ankle Circles/Pumps: AROM;10 reps;Seated;Both Long Arc Quad: AROM;Both;Seated;10 reps End of Session PT - End of Session Equipment Utilized During Treatment: Gait belt (O2 tank) Activity Tolerance: Patient tolerated treatment well;Patient limited by fatigue Patient left: in chair;with call bell in reach;with family/visitor present General Behavior During Session: [facility] for tasks performed Cognition: [facility] for tasks performed  Rebeca Alert [facility] 02/20/2011, 11:09 AM [phone]

## 2011-02-20 NOTE — Progress Notes (Addendum)
ANTICOAGULATION CONSULT NOTE - Follow Up Consult  Pharmacy Consult for: Coumadin Indication: Atrial fibrillation  Allergies  Allergen Reactions  . Sulfonamide Derivatives Hives  . Penicillins Itching and Rash    Patient Measurements: Height: 5\' 1"  (154.9 cm) Weight: 153 lb (69.4 kg) IBW/kg (Calculated) : 47.8   Vital Signs: Temp: 97.5 F (36.4 C) (12/27 0602) Temp src: Oral (12/27 0602) BP: 135/59 mmHg (12/27 0602) Pulse Rate: 65  (12/27 0602)  Labs:  Basename 02/20/11 0450 02/19/11 0511 02/18/11 0500  HGB -- -- 10.8*  HCT -- -- 32.6*  PLT -- -- 212  APTT -- -- --  LABPROT 30.4* 35.3* 48.1*  INR 2.85* 3.46* 5.13*  HEPARINUNFRC -- -- --  CREATININE -- -- 1.24*  CKTOTAL -- -- --  CKMB -- -- --  TROPONINI -- -- --   Estimated Creatinine Clearance: 24.2 ml/min (by C-G formula based on Cr of 1.24).   Medications (inpatient list):  Scheduled:    . albuterol  2.5 mg Nebulization TID  . aspirin EC  81 mg Oral QPM  . calcium carbonate  1 tablet Oral BID WC  . Chlorhexidine Gluconate Cloth  6 each Topical Q0600  . cholecalciferol  2,000 Units Oral Daily  . digoxin  0.125 mg Oral Daily  . diltiazem  240 mg Oral Daily  . Fluticasone-Salmeterol  1 puff Inhalation BID  . furosemide  40 mg Oral Daily  . ipratropium  0.5 mg Nebulization TID  . moxifloxacin  400 mg Oral q1800  . mupirocin ointment  1 application Nasal BID  . oseltamivir  75 mg Oral BID  . pantoprazole  80 mg Oral Q1200  . polyethylene glycol  17 g Oral Daily  . predniSONE  60 mg Oral Q breakfast  . senna-docusate  1 tablet Oral QHS  . sodium chloride  3 mL Intravenous Q12H  . Tamsulosin HCl  0.4 mg Oral Daily  . DISCONTD: polyethylene glycol  17 g Oral Daily   Patient's usual Coumadin dosage is reported as 2 mg daily.  Assessment:  77 year-old female on chronic Coumadin for history of atrial fibrillation  Coumadin doses have been held for 4 days due to supratherapeutic INR  INR is in upper end  of therapeutic range today  Goal of Therapy:  INR 2-3   Plan:   Coumadin 0.5mg  today  Continue daily PT/INR  When discharged, recommend intensive monitoring of INR until routine dosage established, including INR check the day after discharge.  Note that the pharmacologic effect of Coumadin may be increased due to possible drug interactions with Avelox and Tamiflu.  Polo Riley R.Ph.   Ph. [phone] 02/20/2011,9:41 AM

## 2011-02-20 NOTE — Progress Notes (Signed)
SPOKE TO PATIENT/DTR ABOUT D/C PLANS. HAS USED [facility] IN PAST.TC DONNA(COORDIN) WHO WILL CHECK IF THEY CAN PROVIDE HHRN-PT/INR IN AM. HHPT/OT/ST THEY WILL ALSO CHECK BUT LIKELY 24-48HRS FOR INITIAL VISIT. AWAIT RESPONSE.[facility] FOR DME IN PAST,TC [facility] WHO WILL PROVIDE POTTY-CHARITY FOR PRIOR 3N1(THANKS!). ALREADY HAS 02, NEBULIZER MACHINE IN HOME.DTR WILL PROVIDE TRANSP.

## 2011-02-20 NOTE — Progress Notes (Signed)
Pt and daughter voices understanding of d/c instructions.  No changes noted since am assessment.

## 2011-02-20 NOTE — Progress Notes (Signed)
Subjective: Had a difficult night, but doing better this morning. Feeling better since admission.  Objective: Vital signs in last 24 hours: Filed Vitals:   02/19/11 2241 02/19/11 2312 02/20/11 0602 02/20/11 0856  BP:  153/51 135/59   Pulse:  64 65   Temp:  98 F (36.7 C) 97.5 F (36.4 C)   TempSrc:  Oral Oral   Resp:  22 20   Height:      Weight:      SpO2: 91% 95% 92% 91%   Weight change:   Intake/Output Summary (Last 24 hours) at 02/20/11 1059 Last data filed at 02/20/11 1000  Gross per 24 hour  Intake      0 ml  Output    100 ml  Net   -100 ml    Physical Exam: General: Awake, Oriented, No acute distress. HEENT: EOMI. Neck: Supple CV: S1 and S2 Lungs: Good air movement, scattered crackles at bases bilaterally. Abdomen: Soft, Nontender, Nondistended, +bowel sounds. Ext: Good pulses. Trace edema.  Lab Results:  Basename 02/18/11 0500  NA 141  K 4.0  CL 100  CO2 31  GLUCOSE 172*  BUN 36*  CREATININE 1.24*  CALCIUM 8.9  MG --  PHOS --   No results found for this basename: AST:2,ALT:2,ALKPHOS:2,BILITOT:2,PROT:2,ALBUMIN:2 in the last 72 hours No results found for this basename: LIPASE:2,AMYLASE:2 in the last 72 hours  Basename 02/18/11 0500  WBC 7.6  NEUTROABS --  HGB 10.8*  HCT 32.6*  MCV 90.3  PLT 212   No results found for this basename: CKTOTAL:3,CKMB:3,CKMBINDEX:3,TROPONINI:3 in the last 72 hours No components found with this basename: POCBNP:3 No results found for this basename: DDIMER:2 in the last 72 hours No results found for this basename: HGBA1C:2 in the last 72 hours No results found for this basename: CHOL:2,HDL:2,LDLCALC:2,TRIG:2,CHOLHDL:2,LDLDIRECT:2 in the last 72 hours No results found for this basename: TSH,T4TOTAL,FREET3,T3FREE,THYROIDAB in the last 72 hours No results found for this basename: VITAMINB12:2,FOLATE:2,FERRITIN:2,TIBC:2,IRON:2,RETICCTPCT:2 in the last 72 hours  Micro Results: Recent Results (from the past 240 hour(s))   CULTURE, BLOOD (ROUTINE X 2)     Status: Normal   Collection Time   02/14/11  9:30 AM      Component Value Range Status Comment   Specimen Description BLOOD RIGHT WRIST   Final    Special Requests BOTTLES DRAWN AEROBIC AND ANAEROBIC 5CC   Final    Setup Time [id number]   Final    Culture NO GROWTH 5 DAYS   Final    Report Status 02/20/2011 FINAL   Final   CULTURE, BLOOD (ROUTINE X 2)     Status: Normal   Collection Time   02/14/11  9:30 AM      Component Value Range Status Comment   Specimen Description BLOOD LEFT WRIST   Final    Special Requests BOTTLES DRAWN AEROBIC AND ANAEROBIC 5CC   Final    Setup Time [id number]   Final    Culture NO GROWTH 5 DAYS   Final    Report Status 02/20/2011 FINAL   Final   MRSA PCR SCREENING     Status: Abnormal   Collection Time   02/14/11  4:56 PM      Component Value Range Status Comment   MRSA by PCR POSITIVE (*) NEGATIVE  Final     Studies/Results: No results found.  Medications: I have reviewed the patient's current medications. Scheduled Meds:    . albuterol  2.5 mg Nebulization TID  . aspirin EC  81  mg Oral QPM  . calcium carbonate  1 tablet Oral BID WC  . Chlorhexidine Gluconate Cloth  6 each Topical Q0600  . cholecalciferol  2,000 Units Oral Daily  . digoxin  0.125 mg Oral Daily  . diltiazem  240 mg Oral Daily  . Fluticasone-Salmeterol  1 puff Inhalation BID  . furosemide  40 mg Oral Daily  . ipratropium  0.5 mg Nebulization TID  . moxifloxacin  400 mg Oral q1800  . mupirocin ointment  1 application Nasal BID  . oseltamivir  75 mg Oral BID  . pantoprazole  80 mg Oral Q1200  . polyethylene glycol  17 g Oral Daily  . predniSONE  60 mg Oral Q breakfast  . senna-docusate  1 tablet Oral QHS  . sodium chloride  3 mL Intravenous Q12H  . Tamsulosin HCl  0.4 mg Oral Daily  . warfarin  0.5 mg Oral Once  . DISCONTD: polyethylene glycol  17 g Oral Daily   Continuous Infusions:  PRN Meds:.sodium chloride, acetaminophen,  acetaminophen, albuterol, food thickener, guaiFENesin-dextromethorphan, LORazepam, morphine injection, ondansetron (ZOFRAN) IV, ondansetron, oxyCODONE, phenol, sodium chloride  Assessment/Plan: 1. Respiratory failure, acute-on-chronic: Secondary to COPD exacerbation due to influenza and pneumonia.  Continue steroid taper, continue Tamiflu, continue antibiotics (moxifloxacin orally).  Antibiotics till 02/22/2011. Good oxygen saturation and significant improvement in her respiratory distress. Continue oxygen 2L (home is 3L) at discharge.   2. Atrial fibrillation:Continue digoxin and diltiazem. Coumadin per pharmacy. INR now therapeutic.   3. Dysphagia. Continue thick liquids. Continue solid (regular) consistency as per speech therapy.  4. GERD: continue PPI.   5. MRSA: continue mupirocin and chlorhexidine cloths.   6. DVT: on coumadin.  INR still supratherapeutic but improved.  7. Constipation: Continue miralax and sennokot. Resolved.  8. Sore throat secondary to thrush: Completed treatment with nystatin for 3 days; thrush infection resolve and sore throat follow better. Continue oral care.   9. Deconditioning: PT recommending HHPT and OT with 24 hours supervision.  10. Urinary retention: Continue Flomax   11.  Upper respiratory viral infection/Influenza: Continue Tamiflu.   12.  Disposition: Discharge patient home today.   LOS: 6 days  Josceline Chenard A, MD 02/20/2011, 10:59 AM

## 2011-02-20 NOTE — Plan of Care (Signed)
Problem: Phase II Progression Outcomes Goal: Wean O2 if indicated Outcome: Not Applicable Date Met:  02/20/11 Pt on o2 at home

## 2011-02-20 NOTE — Progress Notes (Signed)
[facility], & [facility] WERE INITIALLY UNABLE TO PROVIDE HHRN-FOR PT/INR IN AM.SO CONTACTED [facility] ([facility]) WHO IS ABLE TO PROVIDE HHRN PT/INR VISIT IN AM.ALSO HHPT/OT/ST WILL BE PROVIDED BUT THEY WILL CONTACT PATIENT/DTR FOR VISIT SCHEDULE.MD UPDATED.PATIENT/DTR IN AGREEMENT.

## 2011-02-21 ENCOUNTER — Inpatient Hospital Stay (HOSPITAL_COMMUNITY)
Admission: EM | Admit: 2011-02-21 | Discharge: 2011-02-24 | DRG: 193 | Disposition: A | Discharge status: Home Health | Payer: Medicare Other | Attending: Internal Medicine | Admitting: Internal Medicine

## 2011-02-21 ENCOUNTER — Encounter (HOSPITAL_COMMUNITY): Payer: Self-pay | Admitting: Emergency Medicine

## 2011-02-21 ENCOUNTER — Other Ambulatory Visit: Payer: Self-pay

## 2011-02-21 ENCOUNTER — Emergency Department (HOSPITAL_COMMUNITY): Payer: Medicare Other

## 2011-02-21 DIAGNOSIS — I10 Essential (primary) hypertension: Secondary | ICD-10-CM | POA: Diagnosis present

## 2011-02-21 DIAGNOSIS — A4902 Methicillin resistant Staphylococcus aureus infection, unspecified site: Secondary | ICD-10-CM | POA: Diagnosis present

## 2011-02-21 DIAGNOSIS — J441 Chronic obstructive pulmonary disease with (acute) exacerbation: Secondary | ICD-10-CM | POA: Diagnosis present

## 2011-02-21 DIAGNOSIS — K5900 Constipation, unspecified: Secondary | ICD-10-CM | POA: Diagnosis present

## 2011-02-21 DIAGNOSIS — R4182 Altered mental status, unspecified: Secondary | ICD-10-CM

## 2011-02-21 DIAGNOSIS — Z9981 Dependence on supplemental oxygen: Secondary | ICD-10-CM

## 2011-02-21 DIAGNOSIS — R5381 Other malaise: Secondary | ICD-10-CM | POA: Diagnosis present

## 2011-02-21 DIAGNOSIS — K219 Gastro-esophageal reflux disease without esophagitis: Secondary | ICD-10-CM | POA: Diagnosis present

## 2011-02-21 DIAGNOSIS — J449 Chronic obstructive pulmonary disease, unspecified: Secondary | ICD-10-CM

## 2011-02-21 DIAGNOSIS — J9620 Acute and chronic respiratory failure, unspecified whether with hypoxia or hypercapnia: Secondary | ICD-10-CM | POA: Diagnosis present

## 2011-02-21 DIAGNOSIS — R339 Retention of urine, unspecified: Secondary | ICD-10-CM | POA: Diagnosis present

## 2011-02-21 DIAGNOSIS — R05 Cough: Secondary | ICD-10-CM | POA: Diagnosis present

## 2011-02-21 DIAGNOSIS — J1100 Influenza due to unidentified influenza virus with unspecified type of pneumonia: Principal | ICD-10-CM | POA: Diagnosis present

## 2011-02-21 DIAGNOSIS — R059 Cough, unspecified: Secondary | ICD-10-CM | POA: Diagnosis present

## 2011-02-21 DIAGNOSIS — J029 Acute pharyngitis, unspecified: Secondary | ICD-10-CM | POA: Diagnosis present

## 2011-02-21 DIAGNOSIS — Z87891 Personal history of nicotine dependence: Secondary | ICD-10-CM

## 2011-02-21 DIAGNOSIS — Z7901 Long term (current) use of anticoagulants: Secondary | ICD-10-CM

## 2011-02-21 DIAGNOSIS — J189 Pneumonia, unspecified organism: Secondary | ICD-10-CM

## 2011-02-21 DIAGNOSIS — R6889 Other general symptoms and signs: Secondary | ICD-10-CM | POA: Diagnosis present

## 2011-02-21 DIAGNOSIS — J9600 Acute respiratory failure, unspecified whether with hypoxia or hypercapnia: Secondary | ICD-10-CM

## 2011-02-21 DIAGNOSIS — E876 Hypokalemia: Secondary | ICD-10-CM | POA: Diagnosis present

## 2011-02-21 DIAGNOSIS — I4891 Unspecified atrial fibrillation: Secondary | ICD-10-CM | POA: Diagnosis present

## 2011-02-21 DIAGNOSIS — R1310 Dysphagia, unspecified: Secondary | ICD-10-CM | POA: Diagnosis present

## 2011-02-21 DIAGNOSIS — R531 Weakness: Secondary | ICD-10-CM | POA: Diagnosis present

## 2011-02-21 DIAGNOSIS — Z950 Presence of cardiac pacemaker: Secondary | ICD-10-CM

## 2011-02-21 DIAGNOSIS — J9610 Chronic respiratory failure, unspecified whether with hypoxia or hypercapnia: Secondary | ICD-10-CM | POA: Diagnosis present

## 2011-02-21 HISTORY — DX: Heart failure, unspecified: I50.9

## 2011-02-21 HISTORY — DX: Extended spectrum beta lactamase (ESBL) resistance: Z16.12

## 2011-02-21 HISTORY — DX: Peripheral vascular disease, unspecified: I73.9

## 2011-02-21 HISTORY — DX: Bacterial infection, unspecified: A49.9

## 2011-02-21 HISTORY — DX: Personal history of other diseases of the digestive system: Z87.19

## 2011-02-21 HISTORY — DX: Reserved for concepts with insufficient information to code with codable children: IMO0002

## 2011-02-21 HISTORY — DX: Unspecified osteoarthritis, unspecified site: M19.90

## 2011-02-21 HISTORY — DX: Gangrene, not elsewhere classified: I96

## 2011-02-21 HISTORY — DX: Hyperlipidemia, unspecified: E78.5

## 2011-02-21 HISTORY — DX: Gastro-esophageal reflux disease without esophagitis: K21.9

## 2011-02-21 LAB — DIFFERENTIAL
Eosinophils Relative: 0 % (ref 0–5)
Lymphocytes Relative: 5 % — ABNORMAL LOW (ref 12–46)
Lymphs Abs: 0.9 10*3/uL (ref 0.7–4.0)
Monocytes Absolute: 1.8 10*3/uL — ABNORMAL HIGH (ref 0.1–1.0)
Neutro Abs: 14.7 10*3/uL — ABNORMAL HIGH (ref 1.7–7.7)

## 2011-02-21 LAB — COMPREHENSIVE METABOLIC PANEL
ALT: 23 U/L (ref 0–35)
CO2: 36 mEq/L — ABNORMAL HIGH (ref 19–32)
Calcium: 9.1 mg/dL (ref 8.4–10.5)
Chloride: 94 mEq/L — ABNORMAL LOW (ref 96–112)
Creatinine, Ser: 1.12 mg/dL — ABNORMAL HIGH (ref 0.50–1.10)
GFR calc Af Amer: 47 mL/min — ABNORMAL LOW (ref >90)
GFR calc non Af Amer: 40 mL/min — ABNORMAL LOW (ref >90)
Glucose, Bld: 159 mg/dL — ABNORMAL HIGH (ref 70–99)
Total Bilirubin: 0.6 mg/dL (ref 0.3–1.2)

## 2011-02-21 LAB — CBC
HCT: 36.1 % (ref 36.0–46.0)
Hemoglobin: 11.7 g/dL — ABNORMAL LOW (ref 12.0–15.0)
MCV: 90.5 fL (ref 78.0–100.0)
RBC: 3.99 MIL/uL (ref 3.87–5.11)
WBC: 17.5 10*3/uL — ABNORMAL HIGH (ref 4.0–10.5)

## 2011-02-21 LAB — POCT I-STAT TROPONIN I: Troponin i, poc: 0.08 ng/mL (ref 0.00–0.08)

## 2011-02-21 MED ORDER — WARFARIN SODIUM 1 MG PO TABS
1.0000 mg | ORAL_TABLET | Freq: Once | ORAL | Status: AC
  Administered 2011-02-21: 1 mg via ORAL
  Filled 2011-02-21: qty 1

## 2011-02-21 MED ORDER — DIGOXIN 250 MCG PO TABS
125.0000 ug | ORAL_TABLET | ORAL | Status: DC
  Administered 2011-02-21 – 2011-02-24 (×4): 125 ug via ORAL
  Filled 2011-02-21 (×8): qty 0.5

## 2011-02-21 MED ORDER — ACETAMINOPHEN 650 MG RE SUPP: 650.0000 mg | Freq: Four times a day (QID) | RECTAL | Status: DC | PRN

## 2011-02-21 MED ORDER — OSELTAMIVIR PHOSPHATE 75 MG PO CAPS
75.0000 mg | ORAL_CAPSULE | Freq: Two times a day (BID) | ORAL | Status: AC
  Administered 2011-02-21 – 2011-02-22 (×3): 75 mg via ORAL
  Filled 2011-02-21 (×4): qty 1

## 2011-02-21 MED ORDER — ALBUTEROL SULFATE (5 MG/ML) 0.5% IN NEBU
2.5000 mg | INHALATION_SOLUTION | Freq: Three times a day (TID) | RESPIRATORY_TRACT | Status: DC
  Administered 2011-02-22 – 2011-02-24 (×8): 2.5 mg via RESPIRATORY_TRACT
  Filled 2011-02-21 (×6): qty 0.5

## 2011-02-21 MED ORDER — FLUTICASONE-SALMETEROL 250-50 MCG/DOSE IN AEPB
1.0000 | INHALATION_SPRAY | Freq: Two times a day (BID) | RESPIRATORY_TRACT | Status: DC
  Administered 2011-02-21: 1 via RESPIRATORY_TRACT
  Filled 2011-02-21: qty 14

## 2011-02-21 MED ORDER — STARCH (THICKENING) PO POWD
ORAL | Status: DC | PRN
  Filled 2011-02-21: qty 227

## 2011-02-21 MED ORDER — MOXIFLOXACIN HCL 400 MG PO TABS
400.0000 mg | ORAL_TABLET | Freq: Every day | ORAL | Status: AC
  Administered 2011-02-21 – 2011-02-23 (×3): 400 mg via ORAL
  Filled 2011-02-21 (×3): qty 1

## 2011-02-21 MED ORDER — HYDROCODONE-ACETAMINOPHEN 5-325 MG PO TABS
1.0000 | ORAL_TABLET | Freq: Four times a day (QID) | ORAL | Status: DC | PRN
  Administered 2011-02-22: 1 via ORAL
  Filled 2011-02-21: qty 1

## 2011-02-21 MED ORDER — VITAMIN D 50 MCG (2000 UT) PO TABS: 2000.0000 [IU] | ORAL_TABLET | Freq: Every day | ORAL | Status: DC

## 2011-02-21 MED ORDER — FLUCONAZOLE 100MG IVPB
100.0000 mg | INTRAVENOUS | Status: AC
  Administered 2011-02-21: 100 mg via INTRAVENOUS
  Filled 2011-02-21: qty 50

## 2011-02-21 MED ORDER — PROMETHAZINE HCL 25 MG PO TABS: 25.0000 mg | ORAL_TABLET | Freq: Four times a day (QID) | ORAL | Status: DC | PRN

## 2011-02-21 MED ORDER — GUAIFENESIN 100 MG/5ML PO SOLN
10.0000 mL | Freq: Four times a day (QID) | ORAL | Status: DC
  Administered 2011-02-21 – 2011-02-22 (×6): 200 mg via ORAL
  Filled 2011-02-21 (×11): qty 10

## 2011-02-21 MED ORDER — ACETAMINOPHEN 325 MG PO TABS
650.0000 mg | ORAL_TABLET | Freq: Four times a day (QID) | ORAL | Status: DC | PRN
  Administered 2011-02-21 – 2011-02-24 (×6): 650 mg via ORAL
  Filled 2011-02-21 (×6): qty 2

## 2011-02-21 MED ORDER — ARFORMOTEROL TARTRATE 15 MCG/2ML IN NEBU
15.0000 ug | INHALATION_SOLUTION | Freq: Two times a day (BID) | RESPIRATORY_TRACT | Status: DC
  Administered 2011-02-21 – 2011-02-24 (×6): 15 ug via RESPIRATORY_TRACT
  Filled 2011-02-21 (×15): qty 2

## 2011-02-21 MED ORDER — GUAIFENESIN ER 600 MG PO TB12
600.0000 mg | ORAL_TABLET | Freq: Two times a day (BID) | ORAL | Status: DC
  Filled 2011-02-21: qty 1

## 2011-02-21 MED ORDER — DILTIAZEM HCL ER 240 MG PO CP24
240.0000 mg | ORAL_CAPSULE | Freq: Every day | ORAL | Status: DC
  Administered 2011-02-21 – 2011-02-23 (×3): 240 mg via ORAL
  Filled 2011-02-21 (×4): qty 1

## 2011-02-21 MED ORDER — IPRATROPIUM BROMIDE 0.02 % IN SOLN: 0.5000 mg | Freq: Four times a day (QID) | RESPIRATORY_TRACT | Status: DC | PRN

## 2011-02-21 MED ORDER — MAGIC MOUTHWASH W/LIDOCAINE
5.0000 mL | Freq: Three times a day (TID) | ORAL | Status: DC | PRN
  Administered 2011-02-22 – 2011-02-23 (×4): 5 mL via ORAL
  Filled 2011-02-21 (×5): qty 5

## 2011-02-21 MED ORDER — POLYETHYLENE GLYCOL 3350 17 G PO PACK
17.0000 g | PACK | Freq: Every day | ORAL | Status: DC
  Administered 2011-02-21 – 2011-02-24 (×4): 17 g via ORAL
  Filled 2011-02-21 (×6): qty 1

## 2011-02-21 MED ORDER — PHENOL 1.4 % MT LIQD
2.0000 | OROMUCOSAL | Status: DC | PRN
  Administered 2011-02-23: 2 via OROMUCOSAL

## 2011-02-21 MED ORDER — CALCIUM CARBONATE-VITAMIN D 500-500 MG-UNIT PO CHEW: 1.0000 | CHEWABLE_TABLET | Freq: Two times a day (BID) | ORAL | Status: DC

## 2011-02-21 MED ORDER — VITAMINS A & D EX OINT
TOPICAL_OINTMENT | CUTANEOUS | Status: AC
  Administered 2011-02-21: 23:00:00
  Filled 2011-02-21: qty 5

## 2011-02-21 MED ORDER — PREDNISONE 50 MG PO TABS
60.0000 mg | ORAL_TABLET | Freq: Every day | ORAL | Status: DC
  Administered 2011-02-22: 60 mg via ORAL
  Filled 2011-02-21: qty 1

## 2011-02-21 MED ORDER — BETHANECHOL CHLORIDE 10 MG PO TABS
10.0000 mg | ORAL_TABLET | Freq: Three times a day (TID) | ORAL | Status: DC
  Administered 2011-02-21 – 2011-02-22 (×4): 10 mg via ORAL
  Filled 2011-02-21 (×8): qty 1

## 2011-02-21 MED ORDER — OCUVITE PO TABS
1.0000 | ORAL_TABLET | Freq: Every day | ORAL | Status: DC
  Administered 2011-02-21 – 2011-02-24 (×4): 1 via ORAL
  Filled 2011-02-21 (×6): qty 1

## 2011-02-21 MED ORDER — GUAIFENESIN 100 MG/5ML PO SOLN
5.0000 mL | ORAL | Status: DC | PRN
  Filled 2011-02-21 (×2): qty 10

## 2011-02-21 MED ORDER — ASPIRIN 81 MG PO TABS
81.0000 mg | ORAL_TABLET | Freq: Every evening | ORAL | Status: DC
  Filled 2011-02-21: qty 1

## 2011-02-21 MED ORDER — FUROSEMIDE 40 MG PO TABS
40.0000 mg | ORAL_TABLET | ORAL | Status: DC
  Administered 2011-02-21 – 2011-02-24 (×4): 40 mg via ORAL
  Filled 2011-02-21 (×8): qty 1

## 2011-02-21 MED ORDER — FLUCONAZOLE 100 MG PO TABS
100.0000 mg | ORAL_TABLET | Freq: Every day | ORAL | Status: AC
  Administered 2011-02-22 – 2011-02-23 (×2): 100 mg via ORAL
  Filled 2011-02-21 (×2): qty 1

## 2011-02-21 MED ORDER — ASPIRIN 81 MG PO CHEW
81.0000 mg | CHEWABLE_TABLET | Freq: Every evening | ORAL | Status: DC
  Administered 2011-02-21 – 2011-02-23 (×3): 81 mg via ORAL
  Filled 2011-02-21 (×5): qty 1

## 2011-02-21 MED ORDER — LORAZEPAM 0.5 MG PO TABS: 0.5000 mg | ORAL_TABLET | Freq: Two times a day (BID) | ORAL | Status: DC | PRN

## 2011-02-21 MED ORDER — ALBUTEROL SULFATE (5 MG/ML) 0.5% IN NEBU
2.5000 mg | INHALATION_SOLUTION | Freq: Four times a day (QID) | RESPIRATORY_TRACT | Status: DC | PRN
  Administered 2011-02-21: 2.5 mg via RESPIRATORY_TRACT
  Filled 2011-02-21 (×3): qty 0.5

## 2011-02-21 MED ORDER — BUDESONIDE 0.5 MG/2ML IN SUSP
0.5000 mg | Freq: Two times a day (BID) | RESPIRATORY_TRACT | Status: DC
  Administered 2011-02-21 – 2011-02-24 (×6): 0.5 mg via RESPIRATORY_TRACT
  Filled 2011-02-21 (×10): qty 2

## 2011-02-21 MED ORDER — MOXIFLOXACIN HCL IN NACL 400 MG/250ML IV SOLN
400.0000 mg | Freq: Once | INTRAVENOUS | Status: AC
  Administered 2011-02-21: 400 mg via INTRAVENOUS
  Filled 2011-02-21: qty 250

## 2011-02-21 MED ORDER — VITAMIN D3 25 MCG (1000 UNIT) PO TABS
2000.0000 [IU] | ORAL_TABLET | Freq: Every day | ORAL | Status: DC
  Administered 2011-02-21 – 2011-02-24 (×4): 2000 [IU] via ORAL
  Filled 2011-02-21 (×5): qty 2

## 2011-02-21 MED ORDER — PANTOPRAZOLE SODIUM 40 MG PO TBEC
80.0000 mg | DELAYED_RELEASE_TABLET | Freq: Every day | ORAL | Status: DC
  Administered 2011-02-21 – 2011-02-24 (×4): 80 mg via ORAL
  Filled 2011-02-21 (×5): qty 2

## 2011-02-21 MED ORDER — CALCIUM CARBONATE-VITAMIN D 500-200 MG-UNIT PO TABS
1.0000 | ORAL_TABLET | Freq: Two times a day (BID) | ORAL | Status: DC
  Administered 2011-02-21 – 2011-02-24 (×7): 1 via ORAL
  Filled 2011-02-21 (×10): qty 1

## 2011-02-21 MED ORDER — VANCOMYCIN HCL IN DEXTROSE 1-5 GM/200ML-% IV SOLN
1000.0000 mg | Freq: Once | INTRAVENOUS | Status: DC
  Filled 2011-02-21: qty 200

## 2011-02-21 MED ORDER — SENNOSIDES-DOCUSATE SODIUM 8.6-50 MG PO TABS
1.0000 | ORAL_TABLET | Freq: Every day | ORAL | Status: DC
  Administered 2011-02-21 – 2011-02-23 (×3): 1 via ORAL
  Filled 2011-02-21 (×5): qty 1

## 2011-02-21 NOTE — ED Notes (Signed)
Vital signs stable. 

## 2011-02-21 NOTE — ED Notes (Signed)
Family at bedside. 

## 2011-02-21 NOTE — Progress Notes (Signed)
Speech Language Pathology SLP Cancellation Note  Met with pt and daughter.  Pt swallow function is currently at baseline.  Daughter is fully aware of pt's dysphagia, aspiration risk, need for adherence to safe swallow precautions, and has taken excellent care of her mother in this regard for 2 years.    Per last BSE 02/17/11, SLP and daughter were in agreement that no further dysphagia testing be done.  RN aware.  Thank you, Shantella Blubaugh B. Camyra Vaeth, MSP, CCC-SLP [phone]

## 2011-02-21 NOTE — Progress Notes (Signed)
DR Betti Cruz notified re: patient's daughter to change Herat diet to Regular and to consult Dr Sherene Sires. MD called back and will change diet and will consult .

## 2011-02-21 NOTE — Progress Notes (Signed)
Speech Language Pathology Orders received  Orders received for swallow evaluation.  Pt recently assessed during last hospitalization.  From BSE 02/17/11:  "Clinical Impression Statement: Pt appears with swallow that is at baseline, no overt s/s of aspiration with cracker, honey, nectar and applesauce. Swallow appeared timely, but pt did c/o pain with swallow, pt receiving Nystatin. CXR improved today. Pt has chronic multifactorial dysphagia that her daughter manages. Pt has not had a pna for 2 years per daughter, therefore, she appears to be managing well. Do not recommend repeat MBS as this will not change pt's outcomes. Advised daughter to importance of symptom management regarding dysphagia, use of thickener as needed if it decreases cough and pna events, as well as importance of hydration and adequate respiratory status. Also educated pt and daughter to COPD and dysphagia and episodic aspiration will be ongoing. Daughter verbalized understanding and is in agreement to no further dysphagia testing. Pt albeit with decr intake at home PTA, has been tolerating soft/honey liquids without overt coughing, overt cough with thin water reported."  SLP will follow up once bed assignment made.  Please call if needed sooner.  Thank you, Celia B. Murvin Natal, [facility], CCC-SLP Pager [phone]

## 2011-02-21 NOTE — Progress Notes (Signed)
Patient' daughter refused to wear protective gears for contact precaution, remind her of the benefits.

## 2011-02-21 NOTE — Progress Notes (Signed)
ANTICOAGULATION CONSULT NOTE - Initial Consult  Pharmacy Consult for Coumadin Indication: atrial fibrillation  Allergies  Allergen Reactions  . Sulfonamide Derivatives Hives  . Penicillins Itching and Rash    Patient Measurements:   Adjusted Body Weight:  Vital Signs: Temp: 98.6 F (37 C) (12/28 0741) Temp src: Oral (12/28 0741) BP: 168/54 mmHg (12/28 0741) Pulse Rate: 60  (12/28 0741)  Labs:  Basename 02/21/11 0114 02/20/11 0450 02/19/11 0511  HGB 11.7* -- --  HCT 36.1 -- --  PLT 252 -- --  APTT -- -- --  LABPROT -- 30.4* 35.3*  INR -- 2.85* 3.46*  HEPARINUNFRC -- -- --  CREATININE 1.12* -- --  CKTOTAL -- -- --  CKMB -- -- --  TROPONINI -- -- --   The CrCl is unknown because both a height and weight (above a minimum accepted value) are required for this calculation.  Medical History: Past Medical History  Diagnosis Date  . Femoral artery occlusion   . Coronary artery disease   . COPD (chronic obstructive pulmonary disease)   . Atrial fibrillation   . Dysphagia     pills have to be crushed and given with applesauce  . Arthritis   . Degenerative arthritis of spine   . Pneumonia     hx aspiraton pna  . Bleeding nose Dec. 2012  . H/O hiatal hernia   . GERD (gastroesophageal reflux disease)   . DDD (degenerative disc disease)   . Osteoarthritis   . Peripheral vascular disease   . ESBL (extended spectrum beta-lactamase) producing bacteria infection   . Constipation   . Gangrene     of the bowels  . CHF (congestive heart failure)   . Hyperlipidemia   . Dysphagia    Medications:  Scheduled:    . aspirin  81 mg Oral QPM  . bethanechol  10 mg Oral TID  . digoxin  125 mcg Oral Q0700  . diltiazem  240 mg Oral Daily  . fluconazole (DIFLUCAN) IV  100 mg Intravenous Q24H   Followed by  . fluconazole  100 mg Oral Daily  . Fluticasone-Salmeterol  1 puff Inhalation BID  . furosemide  40 mg Oral Q0700  . guaiFENesin  600 mg Oral BID  . MACULAR VITAMIN  BENEFIT  1 tablet Oral Daily  . moxifloxacin  400 mg Intravenous Once  . moxifloxacin  400 mg Oral q1800  . oseltamivir  75 mg Oral BID  . pantoprazole  80 mg Oral Q1200  . polyethylene glycol  17 g Oral Daily  . vancomycin  1,000 mg Intravenous Once  . Vitamin D  2,000 Units Oral Daily  . DISCONTD: Calcium Carbonate-Vitamin D  1 tablet Oral BID   Infusions:    Assessment:  89 YOF on chronic Coumadin PTA for h/o afib.  Patient was discharged from [facility] on 12/27 with INR of 2.85.  During previous admission, pt received coumadin 2mg  x 2 doses and INR became supratherapeutic, peaking at 5.52.  Patient did not received any Coumadin from 12/23 to discharge date.  Pt was on Avelox during that admission.  Patient is re-admitted today with concerns for HCAP with order for pharmacy to dose Coumadin.    Home regimen PTA to previous admission:  2 mg po daily.   Baseline PT/INR today 2.15  Interaction noted with Avelox, Tamiflu, Fluconazole  Goal of Therapy:  INR 2-3   Plan:   Although INR therapeutic at the moment, would not restart home regimen given recent supratherapeutic  INR and new drug interactions.    Coumadin 1 mg po x 1 tonight  Daily PT/INR  Geoffry Paradise Thi 02/21/2011,9:41 AM

## 2011-02-21 NOTE — ED Notes (Signed)
Patient is resting comfortably. 

## 2011-02-21 NOTE — ED Notes (Signed)
Patient denies pain and is resting comfortably.  

## 2011-02-21 NOTE — ED Provider Notes (Signed)
History     CSN: [id number]  Arrival date & time 02/21/11  4098   First MD Initiated Contact with Patient 02/21/11 0201      Chief Complaint  Patient presents with  . Weakness    Sore throat, dc from hospital within 24 hours.DX with flu while inpt    (Consider location/radiation/quality/duration/timing/severity/associated sxs/prior treatment) Patient is a 77 y.o. female presenting with weakness. The history is provided by a relative. The history is limited by the condition of the patient. No language interpreter was used.  Weakness The primary symptoms include altered mental status. Primary symptoms do not include visual change or memory loss. Primary symptoms comment: inability to walk or eat The symptoms began more than 1 week ago. The symptoms are unchanged. The neurological symptoms are diffuse. Context: Was discharged earlier in the evening from the hospital with same symptoms.  Additional symptoms include weakness. Additional symptoms do not include neck stiffness. Medical issues do not include seizures, cancer, alcohol use or recent surgery. Workup history does not include cerebral angiography or carotid ultrasound.    Past Medical History  Diagnosis Date  . Femoral artery occlusion   . Coronary artery disease   . COPD (chronic obstructive pulmonary disease)   . Atrial fibrillation   . Dysphagia     pills have to be crushed and given with applesauce  . Arthritis   . Degenerative arthritis of spine   . Pneumonia     hx aspiraton pna  . Bleeding nose Dec. 2012    Past Surgical History  Procedure Date  . Pacemaker insertion   . Abdominal hysterectomy   . Appendectomy   . Stomach surgery 1940's    stomach surgery due to gangrene  . Cholecystectomy     No family history on file.  History  Substance Use Topics  . Smoking status: Former Smoker    Types: Cigarettes    Quit date: 02/13/1989  . Smokeless tobacco: Never Used  . Alcohol Use: No    OB History    Grav Para Term Preterm Abortions TAB SAB Ect Mult Living                  Review of Systems  Unable to perform ROS HENT: Negative for neck stiffness.   Neurological: Positive for weakness.  Psychiatric/Behavioral: Positive for altered mental status. Negative for memory loss.    Allergies  Sulfonamide derivatives and Penicillins  Home Medications   Current Outpatient Rx  Name Route Sig Dispense Refill  . ALBUTEROL SULFATE (5 MG/ML) 0.5% IN NEBU Nebulization Take 0.5 mLs (2.5 mg total) by nebulization every 6 (six) hours as needed for wheezing or shortness of breath. 20 mL 0  . VITAMIN C 1000 MG PO TABS Oral Take 4,000 mg by mouth daily.      . ASPIRIN 81 MG PO TABS Oral Take 81 mg by mouth every evening.     [name] Kitchen BETHANECHOL CHLORIDE 10 MG PO TABS Oral Take 10 mg by mouth 3 (three) times daily.      [name] Kitchen CALCIUM CARBONATE-VITAMIN D 500-500 MG-UNIT PO CHEW Oral Chew 1 tablet by mouth 2 (two) times daily.      [name] Kitchen VITAMIN D 2000 UNITS PO TABS Oral Take 2,000 Units by mouth daily.      [name] Kitchen CRANBERRY EXTRACT PO Oral Take 1 tablet by mouth 2 (two) times daily.      [name] Kitchen DIGOXIN 0.25 MG PO TABS Oral Take 0.125 mcg by mouth every morning. Pt takes (  1/2 tab) for 0.176mcg dose    . DILTIAZEM HCL ER 240 MG PO CP24 Oral Take 240 mg by mouth daily.     [name] Kitchen ESOMEPRAZOLE MAGNESIUM 40 MG PO CPDR Oral Take 40 mg by mouth daily before breakfast.      . FLUTICASONE-SALMETEROL 250-50 MCG/DOSE IN AEPB Inhalation Inhale 1 puff into the lungs 2 (two) times daily. 60 each 0  . STARCH (THICKENING) PO POWD  With water/liquids. 1 Can 0  . FUROSEMIDE 40 MG PO TABS Oral Take 40 mg by mouth every morning.     . GUAIFENESIN ER 600 MG PO TB12 Oral Take 600 mg by mouth 2 (two) times daily.      . GUAIFENESIN 100 MG/5ML PO SOLN Oral Take 5 mLs by mouth every 4 (four) hours as needed. cough     . HYDROCHLOROTHIAZIDE 12.5 MG PO TABS Oral Take 12.5 mg by mouth daily.      [name] Kitchen HYDROCODONE-ACETAMINOPHEN 5-325 MG PO TABS Oral Take 1  tablet by mouth every 6 (six) hours as needed. pain     . IPRATROPIUM BROMIDE 0.02 % IN SOLN Nebulization Take 2.5 mLs (0.5 mg total) by nebulization every 6 (six) hours as needed for wheezing. 75 mL 0  . LORAZEPAM 1 MG PO TABS Oral Take 0.5 tablets (0.5 mg total) by mouth 2 (two) times daily as needed for anxiety. 14 tablet 0  . MACULAR VITAMIN BENEFIT PO Oral Take 2 tablets by mouth.      . ESTROVEN PO Oral Take 1 tablet by mouth daily.      . OSELTAMIVIR PHOSPHATE 75 MG PO CAPS Oral Take 1 capsule (75 mg total) by mouth 2 (two) times daily. 3 capsule 0  . PHENOL 1.4 % MT LIQD Mouth/Throat Use as directed 2 sprays in the mouth or throat every 4 (four) hours as needed (For sore throat.). 1 Bottle 0  . POLYETHYLENE GLYCOL 3350 PO PACK Oral Take 17 g by mouth daily. 30 each 0  . PREDNISONE 10 MG PO TABS  Take 60 mg po qday x2 days, then 40 mg po qday x2 days, then 20 mg po qday x2 days, then 10 mg po qday x2 days, then 5 mg po qday x2 days then discontinue. 28 tablet 0  . PROMETHAZINE HCL 25 MG PO TABS Oral Take 25 mg by mouth every 6 (six) hours as needed. nausea     . SENNOSIDES-DOCUSATE SODIUM 8.6-50 MG PO TABS Oral Take 1 tablet by mouth at bedtime. Hold for diarrhea. 30 tablet 0  . MOXIFLOXACIN HCL 400 MG PO TABS Oral Take 1 tablet (400 mg total) by mouth daily at 6 PM. 3 tablet 0    BP 157/59  Pulse 59  Temp(Src) 98.7 F (37.1 C) (Oral)  Resp 15  SpO2 94%  Physical Exam  Constitutional:       frail  HENT:  Head: Normocephalic and atraumatic.  Right Ear: External ear normal.  Left Ear: External ear normal.  Mouth/Throat: No oropharyngeal exudate.  Eyes: Conjunctivae are normal. Pupils are equal, round, and reactive to light.  Neck: Normal range of motion. Neck supple.  Cardiovascular: An irregular rhythm present.  Pulmonary/Chest: No stridor. She has rales.  Abdominal: Soft. Bowel sounds are normal. There is no tenderness. There is no rebound and no guarding.  Neurological: She  has normal reflexes.  Skin: Skin is warm and dry.    ED Course  Procedures (including critical care time)  Labs Reviewed  CBC -  Abnormal; Notable for the following:    WBC 17.5 (*)    Hemoglobin 11.7 (*)    All other components within normal limits  DIFFERENTIAL - Abnormal; Notable for the following:    Neutrophils Relative 84 (*)    Neutro Abs 14.7 (*)    Lymphocytes Relative 5 (*)    Monocytes Absolute 1.8 (*)    All other components within normal limits  COMPREHENSIVE METABOLIC PANEL - Abnormal; Notable for the following:    Chloride 94 (*)    CO2 36 (*)    Glucose, Bld 159 (*)    BUN 27 (*)    Creatinine, Ser 1.12 (*)    Albumin 2.9 (*)    GFR calc non Af Amer 40 (*)    GFR calc Af Amer 47 (*)    All other components within normal limits  RAPID STREP SCREEN  POCT I-STAT TROPONIN I  I-STAT TROPONIN I  URINALYSIS, ROUTINE W REFLEX MICROSCOPIC  URINE CULTURE   Dg Chest 2 View  02/21/2011  *RADIOLOGY REPORT*  Clinical Data: Cough and fever.  CHEST - 2 VIEW  Comparison: Chest radiograph performed 02/17/2011  Findings: The lungs are well-aerated.  Patchy bibasilar airspace opacities raise question for pneumonia, slightly more apparent than on the prior study.  There is no evidence of pleural effusion or pneumothorax.  The heart is borderline normal in size; a pacemaker is noted at the left chest wall, with a single lead ending at the right ventricle. No acute osseous abnormalities are seen.  IMPRESSION: Patchy bibasilar airspace opacities raise concern for pneumonia.  Original Report Authenticated By: Tonia Ghent, M.D.     1. Healthcare-associated pneumonia   2. Altered mental state       MDM   Date: 02/21/2011  Rate:60  Rhythm: atrial fibrillation paced  QRS Axis: left  Intervals: indeterminant  ST/T Wave abnormalities: nonspecific ST changes  Conduction Disutrbances:none  Narrative Interpretation:   Old EKG Reviewed: changes noted   MDM Reviewed: previous  chart and nursing note Interpretation: labs, ECG and x-ray Consults: admitting MD        Shelva Hetzer K Anisa Leanos-Rasch, MD 02/21/11 [phone]

## 2011-02-21 NOTE — Consult Note (Signed)
Name: Peggy Harvey MRN: [id number] DOB: 1913-05-05    LOS: 0  PCCM CONSULTATION NOTE  History of Present Illness: 77 y.o. Caucasian female who was discharged  02/20/2011 for acute on chronic respiratory failure secondary to COPD exacerbation from influenza and pneumonia. After being discharged patient's daughter noted that patient was very weak, she felt bad, and had sore throat and could not take care of her mother at home as a result brought the patient back to the emergency department for further evaluation. Patient has not had any fevers at home, chills, or chest pain. Patient did complain of increasing shortness of breath with exertion.   Given patient's poor appetite and sore throat or difficulty swallowing the hospitalist service was asked to readmit the patient for further evaluation and requested pulmonary eval pm 12/28  Pt has copd clinically (no pft's on file) s/p smoking cessation in 1978 and is chronically 02 dep but lives at home with daughter.  Cc congested cough now and mild sob at rest but is DNI status.  Sleeping ok without nocturnal  or early am exacerbation  of respiratory  c/o's or need for noct saba. Also denies any obvious fluctuation of symptoms with weather or environmental changes or other aggravating or alleviating factors except as outlined above   ROS  At present neg for  any significant  dysphagia, itching, sneezing,  nasal congestion or excess/ purulent secretions,  fever, chills, sweats, unintended wt loss, pleuritic or exertional cp, hempoptysis, orthopnea pnd or leg swelling.  Also denies presyncope, palpitations, heartburn, abdominal pain, nausea, vomiting, diarrhea  or change in bowel or urinary habits, dysuria,hematuria,  rash, arthralgias, visual complaints, headache, numbness weakness or ataxia.     Current Allergies  ! PCN  ! SULFA    Past Medical History:  Atrial Fibrillation on coumadin C O P D  - No PFT's in sytem    Hypertension    Cholecystectomy  hysterectomy  appendix  degen disk disease  pneumonia- pvax in past    Past Surgical History:  gallbladder removal  hysterectomy  appendectomy  pacemaker 6/09   Family History:  Coronary Heart Disease father, mother, sister  Brother had Hodgkins  Sister breast cancer  Brother lung ca   Social History:  Patient states former smoker, quit 1978  widowed  lives with daughter      Cultures: MRSA 12/28 > neg  Antibiotics: Avelox 12/28 >>>        Past Medical History  Diagnosis Date  . Femoral artery occlusion   . Coronary artery disease   . COPD (chronic obstructive pulmonary disease)   . Atrial fibrillation   . Dysphagia     pills have to be crushed and given with applesauce  . Arthritis   . Degenerative arthritis of spine   . Pneumonia     hx aspiraton pna  . Bleeding nose Dec. 2012  . H/O hiatal hernia   . GERD (gastroesophageal reflux disease)   . DDD (degenerative disc disease)   . Osteoarthritis   . Peripheral vascular disease   . ESBL (extended spectrum beta-lactamase) producing bacteria infection   . Constipation   . Gangrene     of the bowels  . CHF (congestive heart failure)   . Hyperlipidemia   . Dysphagia    Past Surgical History  Procedure Date  . Pacemaker insertion   . Abdominal hysterectomy   . Appendectomy   . Stomach surgery 1940's    stomach surgery due to gangrene  .  Cholecystectomy   . Cataract extraction, bilateral    Prior to Admission medications   Medication Sig Start Date End Date Taking? Authorizing Provider  albuterol (PROVENTIL) (5 MG/ML) 0.5% nebulizer solution Take 0.5 mLs (2.5 mg total) by nebulization every 6 (six) hours as needed for wheezing or shortness of breath. 02/20/11 02/20/12 Yes Srikar A Reddy  Ascorbic Acid (VITAMIN C) 1000 MG tablet Take 4,000 mg by mouth daily.     Yes Historical Provider, MD  aspirin 81 MG tablet Take 81 mg by mouth every evening.    Yes Historical Provider, MD   bethanechol (URECHOLINE) 10 MG tablet Take 10 mg by mouth 3 (three) times daily.     Yes Historical Provider, MD  Calcium Carbonate-Vitamin D (OS-CAL 500 + D) 500-500 MG-UNIT CHEW Chew 1 tablet by mouth 2 (two) times daily.     Yes Historical Provider, MD  Cholecalciferol (VITAMIN D) 2000 UNITS tablet Take 2,000 Units by mouth daily.     Yes Historical Provider, MD  CRANBERRY EXTRACT PO Take 1 tablet by mouth 2 (two) times daily.     Yes Historical Provider, MD  digoxin (LANOXIN) 0.25 MG tablet Take 0.125 mcg by mouth every morning. Pt takes (1/2 tab) for 0.191mcg dose   Yes Historical Provider, MD  diltiazem (DILACOR XR) 240 MG 24 hr capsule Take 240 mg by mouth daily.    Yes Historical Provider, MD  esomeprazole (NEXIUM) 40 MG capsule Take 40 mg by mouth daily before breakfast.     Yes Historical Provider, MD  Fluticasone-Salmeterol (ADVAIR) 250-50 MCG/DOSE AEPB Inhale 1 puff into the lungs 2 (two) times daily. 02/20/11  Yes Srikar A Reddy  food thickener (THICK IT) POWD With water/liquids. 02/20/11  Yes Srikar A Reddy  furosemide (LASIX) 40 MG tablet Take 40 mg by mouth every morning.    Yes Historical Provider, MD  guaiFENesin (MUCINEX) 600 MG 12 hr tablet Take 600 mg by mouth 2 (two) times daily.     Yes Historical Provider, MD  guaiFENesin (ROBITUSSIN) 100 MG/5ML SOLN Take 5 mLs by mouth every 4 (four) hours as needed. cough    Yes Historical Provider, MD  hydrochlorothiazide (HYDRODIURIL) 12.5 MG tablet Take 12.5 mg by mouth daily.     Yes Historical Provider, MD  HYDROcodone-acetaminophen (NORCO) 5-325 MG per tablet Take 1 tablet by mouth every 6 (six) hours as needed. pain    Yes Historical Provider, MD  ipratropium (ATROVENT) 0.02 % nebulizer solution Take 2.5 mLs (0.5 mg total) by nebulization every 6 (six) hours as needed for wheezing. 02/20/11 02/20/12 Yes Srikar A Reddy  LORazepam (ATIVAN) 1 MG tablet Take 0.5 tablets (0.5 mg total) by mouth 2 (two) times daily as needed for anxiety.  02/20/11 03/02/11 Yes Srikar A Reddy  Multiple Vitamins-Minerals (MACULAR VITAMIN BENEFIT PO) Take 2 tablets by mouth.     Yes Historical Provider, MD  Nutritional Supplements (ESTROVEN PO) Take 1 tablet by mouth daily.     Yes Historical Provider, MD  oseltamivir (TAMIFLU) 75 MG capsule Take 1 capsule (75 mg total) by mouth 2 (two) times daily. 02/20/11 03/02/11 Yes Srikar A Reddy  phenol (CHLORASEPTIC) 1.4 % LIQD Use as directed 2 sprays in the mouth or throat every 4 (four) hours as needed (For sore throat.). 02/20/11  Yes Srikar A Reddy  polyethylene glycol (MIRALAX / GLYCOLAX) packet Take 17 g by mouth daily. 02/20/11 02/23/11 Yes Srikar A Reddy  predniSONE (DELTASONE) 10 MG tablet Take 60 mg po qday x2 days,  then 40 mg po qday x2 days, then 20 mg po qday x2 days, then 10 mg po qday x2 days, then 5 mg po qday x2 days then discontinue. 02/20/11  Yes Srikar A Reddy  promethazine (PHENERGAN) 25 MG tablet Take 25 mg by mouth every 6 (six) hours as needed. nausea    Yes Historical Provider, MD  senna-docusate (SENOKOT-S) 8.6-50 MG per tablet Take 1 tablet by mouth at bedtime. Hold for diarrhea. 02/20/11 02/20/12 Yes Srikar A Reddy  moxifloxacin (AVELOX) 400 MG tablet Take 1 tablet (400 mg total) by mouth daily at 6 PM. 02/20/11 02/22/11  Srikar A Reddy   Allergies Allergies  Allergen Reactions  . Sulfonamide Derivatives Hives  . Penicillins Itching and Rash    Family History History reviewed. No pertinent family history.  Social History  reports that she quit smoking about 22 years ago. Her smoking use included Cigarettes. She has a 25 pack-year smoking history. She has never used smokeless tobacco. She reports that she does not drink alcohol or use illicit drugs.  Review Of Systems  11 points review of systems is negative with an exception of listed in HPI.  Vital Signs: Temp:  [98.3 F (36.8 C)-98.7 F (37.1 C)] 98.6 F (37 C) (12/28 0741) Pulse Rate:  [59-66] 66  (12/28 1353) Resp:   [15-18] 18  (12/28 0741) BP: (153-168)/(52-59) 168/54 mmHg (12/28 0741) SpO2:  [92 %-98 %] 95 % (12/28 0741)    Physical Examination: Very frail elderly wf with congested cough but can't cough up specimen   - sats ok on 2lpm   HEENT mild turbinate edema.  Oropharynx no thrush or excess pnd or cobblestoning.  No JVD or cervical adenopathy. Mild accessory muscle hypertrophy. Trachea midline, nl thryroid. Chest was hyperinflated by percussion with diminished breath sounds and moderate increased exp time with junky early exp bilateral  wheeze. Hoover sign positive at early  inspiration. Regular rate and rhythm without murmur gallop or rub or increase P2 or edema.  Abd: no hsm, nl excursion. Ext warm without cyanosis or clubbing.    Ventilator settings:    Labs and Imaging:    Lab 02/21/11 0114 02/18/11 0500 02/16/11 0600  NA 139 141 137  K 3.7 4.0 3.9  CL 94* 100 96  CO2 36* 31 30  BUN 27* 36* 35*  CREATININE 1.12* 1.24* 1.33*  GLUCOSE 159* 172* 198*    Lab 02/21/11 0114 02/18/11 0500  HGB 11.7* 10.8*  HCT 36.1 32.6*  WBC 17.5* 7.6  PLT 252 212    pa and lat cxr 12/28 Patchy bibasilar airspace opacities raise concern for pneumonia.   Assessment and Plan:  Acute on chronic hypoxemic RF/ 02 dep with HC03 level over 30 so has hypercarbic component as well   - Agree with rx x would use brovana/budesonide in place of advair here  CAP H/o pos FLu screen but agree now with covering with empiric avelox.  Discussed with daughter there's really nothing else to offer in this setting from a pccm perspective and  DNI  status is appropriate - hopefully she will turn the corner and be able to return home soon   Sandrea Hughs, MD Pulmonary and Critical Care Medicine [facility] [phone]

## 2011-02-21 NOTE — H&P (Signed)
Patient's PCP: Gweneth Dimitri, MD, MD  Chief Complaint: Weakness and feeling bad at home.  History of Present Illness: Peggy Harvey is a 77 y.o. Caucasian female who was discharged yesterday, 02/20/2011 for acute on chronic respiratory failure secondary to COPD exacerbation from influenza and pneumonia.  After being discharged patient's daughter noted that patient was very weak, she felt bad, and had sore throat and could not take care of her mother at home as a result brought the patient back to the emergency department for further evaluation.  Patient has not had any fevers at home, chills, or chest pain.  Patient did complain of increasing shortness of breath with exertion.  Denies any abdominal pain, headaches or vision changes.  Given patient's poor appetite and sore throat or difficulty swallowing the hospitalist service was asked to readmit the patient for further evaluation.  Meds: Scheduled Meds:   . fluconazole (DIFLUCAN) IV  100 mg Intravenous Q24H   Followed by  . fluconazole  100 mg Oral Daily  . moxifloxacin  400 mg Intravenous Once  . vancomycin  1,000 mg Intravenous Once   Continuous Infusions:  PRN Meds:. Allergies: Sulfonamide derivatives and Penicillins Past Medical History  Diagnosis Date  . Femoral artery occlusion   . Coronary artery disease   . COPD (chronic obstructive pulmonary disease)   . Atrial fibrillation   . Dysphagia     pills have to be crushed and given with applesauce  . Arthritis   . Degenerative arthritis of spine   . Pneumonia     hx aspiraton pna  . Bleeding nose Dec. 2012   Past Surgical History  Procedure Date  . Pacemaker insertion   . Abdominal hysterectomy   . Appendectomy   . Stomach surgery 1940's    stomach surgery due to gangrene  . Cholecystectomy    History reviewed. No pertinent family history. History   Social History  . Marital Status: Widowed    Spouse Name: N/A    Number of Children: N/A  . Years of Education:  N/A   Occupational History  . Not on file.   Social History Main Topics  . Smoking status: Former Smoker    Types: Cigarettes    Quit date: 02/13/1989  . Smokeless tobacco: Never Used  . Alcohol Use: No  . Drug Use: No  . Sexually Active:    Other Topics Concern  . Not on file   Social History Narrative  . No narrative on file   Review of Systems: All systems reviewed with the patient and positive as per history of present illness, otherwise all other systems are negative.  Physical Exam: Blood pressure 168/54, pulse 60, temperature 98.6 F (37 C), temperature source Oral, resp. rate 18, SpO2 95.00%. General: Awake, Oriented to self and location, No acute distress. HEENT: EOMI, Dry mucous membranes, white mucus noted in the posterior oropharynx. Neck: Supple CV: S1 and S2 Lungs: Clear to ascultation bilaterally Abdomen: Soft, Nontender, Nondistended, +bowel sounds. Ext: Good pulses. Trace edema. No clubbing or cyanosis noted. Neuro: Cranial Nerves II-XII grossly intact. Has 5/5 motor strength in upper and 4/5 motor strength in lower extremities.  Lab results:  [facility] 02/21/11 0114  NA 139  K 3.7  CL 94*  CO2 36*  GLUCOSE 159*  BUN 27*  CREATININE 1.12*  CALCIUM 9.1  MG --  PHOS --    Basename 02/21/11 0114  AST 21  ALT 23  ALKPHOS 56  BILITOT 0.6  PROT 6.3  ALBUMIN  2.9*   No results found for this basename: LIPASE:2,AMYLASE:2 in the last 72 hours  Basename 02/21/11 0114  WBC 17.5*  NEUTROABS 14.7*  HGB 11.7*  HCT 36.1  MCV 90.5  PLT 252   No results found for this basename: CKTOTAL:3,CKMB:3,CKMBINDEX:3,TROPONINI:3 in the last 72 hours No components found with this basename: POCBNP:3 No results found for this basename: DDIMER in the last 72 hours No results found for this basename: HGBA1C:2 in the last 72 hours No results found for this basename: CHOL:2,HDL:2,LDLCALC:2,TRIG:2,CHOLHDL:2,LDLDIRECT:2 in the last 72 hours No results found for this  basename: TSH,T4TOTAL,FREET3,T3FREE,THYROIDAB in the last 72 hours No results found for this basename: VITAMINB12:2,FOLATE:2,FERRITIN:2,TIBC:2,IRON:2,RETICCTPCT:2 in the last 72 hours Imaging results:  Dg Chest 2 View  02/21/2011  *RADIOLOGY REPORT*  Clinical Data: Cough and fever.  CHEST - 2 VIEW  Comparison: Chest radiograph performed 02/17/2011  Findings: The lungs are well-aerated.  Patchy bibasilar airspace opacities raise question for pneumonia, slightly more apparent than on the prior study.  There is no evidence of pleural effusion or pneumothorax.  The heart is borderline normal in size; a pacemaker is noted at the left chest wall, with a single lead ending at the right ventricle. No acute osseous abnormalities are seen.  IMPRESSION: Patchy bibasilar airspace opacities raise concern for pneumonia.  Original Report Authenticated By: Tonia Ghent, M.D.   Dg Chest 2 View  02/17/2011  *RADIOLOGY REPORT*  Clinical Data: Shortness of breath  CHEST - 2 VIEW  Comparison: 02/14/2011  Findings: Left subclavian transvenous pacemaker stable. Improvement in the pulmonary vascular congestion.  Patchy atelectasis or infiltrates in the lower lobes, left greater than right, slightly improved.  Heart size upper limits normal. Tortuous atheromatous thoracic aorta.  Regional bones unremarkable.  IMPRESSION:  1.  Improving vascular congestion and bibasilar atelectasis.  Original Report Authenticated By: Thora Lance III, M.D.   Dg Hip Complete Left  02/17/2011  *RADIOLOGY REPORT*  Clinical Data: Left hip pain, known known injury  LEFT HIP - COMPLETE 2+ VIEW  Comparison: None.  Findings: The bones are diffusely osteopenic.  There is a moderate lumbar scoliosis convex to the right with associated degenerative change.  Surgical clips are noted in the right upper quadrant from prior cholecystectomy.  A moderate amount of feces is noted throughout the colon.  Both hips are in normal position with moderate  degenerative joint disease for age.  No acute fracture is seen.  The pelvic rami are intact.  IMPRESSION:  1.  Osteopenia.  No acute hip fracture. 2.  Lumbar scoliosis with diffuse degenerative change.  Original Report Authenticated By: Juline Patch, M.D.   Dg Chest Portable 1 View  02/14/2011  *RADIOLOGY REPORT*  Clinical Data: Shortness of breath, cough, fever and weakness, former smoker  PORTABLE CHEST - 1 VIEW  Comparison: Chest x-ray of 02/13/2011  Findings: There is cardiomegaly present with mild pulmonary vascular congestion.  No focal infiltrate is seen.  A single lead permanent pacemaker is present.  No acute bony abnormality is noted.  IMPRESSION: Cardiomegaly and mild pulmonary vascular congestion with basilar atelectasis.  Original Report Authenticated By: Juline Patch, M.D.   Assessment & Plan by Problem: 1.  Sore throat.  Patient had rapid strep in the emergency department which was negative.  Patient already on moxifloxacin which should have coverage for Streptococcus.  Patient already on Tamiflu for influenza.  Suspect patient's sore throat is likely from recent influenza infection.  Started the patient on fluconazole to cover for oral  candidiasis, for a total of 3 days.  Continue oral care.  2.  Generalized weakness and deconditioning.  I had an extensive discussion with the patient's daughter who declined skilled nursing facility for rehabilitation and after the patient improved would like to take the patient home with home health.  3. Respiratory failure, acute-on-chronic: Secondary to COPD exacerbation due to influenza and pneumonia. Continue steroid taper, continue Tamiflu, continue antibiotics (moxifloxacin orally). Antibiotics till 02/22/2011. Continue oxygen 2L (home is 3L) at discharge.   4. Atrial fibrillation:Continue digoxin and diltiazem. Coumadin per pharmacy. INR now therapeutic.   5. Dysphagia. Continue thick liquids. Continue solid (regular) consistency as per speech  therapy.   6. GERD: continue PPI.   7. MRSA: continue mupirocin and chlorhexidine cloths.   8. Constipation: Continue miralax and sennokot. Resolved.   9. Urinary retention: Continue the patient on home bethanechol.  10. Upper respiratory viral infection/Influenza: Continue Tamiflu.   11.  Prophylaxis.  Continue Coumadin, INR therapeutic.  12.  CODE STATUS.  Discussed with patient and daughter who indicated that the patient could have shocking, CPR and chest compressions.  However they indicated that they do not want the patient intubated.  Time spent on admission, talking to the patient, and coordinating care was: 60 mins.  Jovanka Westgate A, MD 02/21/2011, 9:14 AM

## 2011-02-22 DIAGNOSIS — J189 Pneumonia, unspecified organism: Secondary | ICD-10-CM

## 2011-02-22 DIAGNOSIS — J9600 Acute respiratory failure, unspecified whether with hypoxia or hypercapnia: Secondary | ICD-10-CM

## 2011-02-22 DIAGNOSIS — E876 Hypokalemia: Secondary | ICD-10-CM | POA: Diagnosis present

## 2011-02-22 LAB — BASIC METABOLIC PANEL
BUN: 19 mg/dL (ref 6–23)
CO2: 37 mEq/L — ABNORMAL HIGH (ref 19–32)
Calcium: 8.5 mg/dL (ref 8.4–10.5)
Creatinine, Ser: 0.91 mg/dL (ref 0.50–1.10)
GFR calc non Af Amer: 52 mL/min — ABNORMAL LOW (ref >90)
Glucose, Bld: 117 mg/dL — ABNORMAL HIGH (ref 70–99)

## 2011-02-22 LAB — CBC
MCH: 29 pg (ref 26.0–34.0)
MCHC: 32.4 g/dL (ref 30.0–36.0)
MCV: 89.4 fL (ref 78.0–100.0)
Platelets: 247 10*3/uL (ref 150–400)
RBC: 3.76 MIL/uL — ABNORMAL LOW (ref 3.87–5.11)
RDW: 14.4 % (ref 11.5–15.5)

## 2011-02-22 LAB — GLUCOSE, CAPILLARY: Glucose-Capillary: 119 mg/dL — ABNORMAL HIGH (ref 70–99)

## 2011-02-22 MED ORDER — SODIUM CHLORIDE 0.9 % IV SOLN
INTRAVENOUS | Status: DC | PRN
  Administered 2011-02-22: 250 mL via INTRAVENOUS

## 2011-02-22 MED ORDER — POTASSIUM CHLORIDE 10 MEQ/100ML IV SOLN
10.0000 meq | INTRAVENOUS | Status: AC
  Administered 2011-02-22 (×3): 10 meq via INTRAVENOUS
  Filled 2011-02-22 (×3): qty 100

## 2011-02-22 MED ORDER — BETHANECHOL CHLORIDE 10 MG PO TABS
10.0000 mg | ORAL_TABLET | Freq: Three times a day (TID) | ORAL | Status: DC | PRN
  Administered 2011-02-22 – 2011-02-24 (×3): 10 mg via ORAL
  Filled 2011-02-22 (×2): qty 1

## 2011-02-22 MED ORDER — CHLORHEXIDINE GLUCONATE 0.12 % MT SOLN
15.0000 mL | Freq: Two times a day (BID) | OROMUCOSAL | Status: DC
  Administered 2011-02-22 – 2011-02-24 (×5): 15 mL via OROMUCOSAL
  Filled 2011-02-22 (×8): qty 15

## 2011-02-22 MED ORDER — BIOTENE DRY MOUTH MT LIQD
15.0000 mL | Freq: Two times a day (BID) | OROMUCOSAL | Status: DC
  Administered 2011-02-22 – 2011-02-23 (×4): 15 mL via OROMUCOSAL

## 2011-02-22 MED ORDER — SODIUM CHLORIDE 0.9 % IJ SOLN
3.0000 mL | Freq: Two times a day (BID) | INTRAMUSCULAR | Status: DC
  Administered 2011-02-21 – 2011-02-23 (×5): 3 mL via INTRAVENOUS

## 2011-02-22 MED ORDER — PREDNISONE 20 MG PO TABS
40.0000 mg | ORAL_TABLET | Freq: Every day | ORAL | Status: DC
  Administered 2011-02-23 – 2011-02-24 (×2): 40 mg via ORAL
  Filled 2011-02-22 (×3): qty 2

## 2011-02-22 MED ORDER — WARFARIN SODIUM 1 MG PO TABS
1.0000 mg | ORAL_TABLET | Freq: Once | ORAL | Status: AC
  Filled 2011-02-22: qty 1

## 2011-02-22 MED ORDER — GUAIFENESIN 100 MG/5ML PO SOLN
10.0000 mL | Freq: Four times a day (QID) | ORAL | Status: DC
  Administered 2011-02-22 – 2011-02-24 (×7): 200 mg via ORAL
  Filled 2011-02-22 (×16): qty 10

## 2011-02-22 NOTE — Progress Notes (Signed)
Patient while sleeping sats <90%--between 86-89% on 3l  [place]--mouth breathing in supine position--increased to 4l without any change in sats. Changed to VM at 35%--equal to 3l [place]-- with sats increased to 96%. Patient without complaints except wanting to go to sleep K 3.0 this am --paged Lenny Pastel about K and the above

## 2011-02-22 NOTE — Progress Notes (Signed)
Subjective: Still feeling weak been doing a little bit better.  Objective: Vital signs in last 24 hours: Filed Vitals:   02/22/11 0629 02/22/11 0745 02/22/11 0845 02/22/11 1454  BP:    145/79  Pulse: 67   77  Temp:    97.7 F (36.5 C)  TempSrc:    Oral  Resp:    18  Height:      Weight:   69.4 kg (153 lb)   SpO2: 96% 94%  98%   Weight change:   Intake/Output Summary (Last 24 hours) at 02/22/11 1542 Last data filed at 02/22/11 0010  Gross per 24 hour  Intake    123 ml  Output    327 ml  Net   -204 ml    Physical Exam: General: Awake, Oriented, No acute distress. HEENT: EOMI. Neck: Supple CV: S1 and S2 Lungs: Scattered wheezing at bases.  Good respiratory effort. Abdomen: Soft, Nontender, Nondistended, +bowel sounds. Ext: Good pulses. Trace edema.  Lab Results:  Basename 02/22/11 0700 02/22/11 0400 02/21/11 0114  NA -- 138 139  K -- 3.0* 3.7  CL -- 95* 94*  CO2 -- 37* 36*  GLUCOSE -- 117* 159*  BUN -- 19 27*  CREATININE -- 0.91 1.12*  CALCIUM -- 8.5 9.1  MG 2.0 -- --  PHOS -- -- --    Basename 02/21/11 0114  AST 21  ALT 23  ALKPHOS 56  BILITOT 0.6  PROT 6.3  ALBUMIN 2.9*   No results found for this basename: LIPASE:2,AMYLASE:2 in the last 72 hours  Basename 02/22/11 0400 02/21/11 0114  WBC 12.6* 17.5*  NEUTROABS -- 14.7*  HGB 10.9* 11.7*  HCT 33.6* 36.1  MCV 89.4 90.5  PLT 247 252   No results found for this basename: CKTOTAL:3,CKMB:3,CKMBINDEX:3,TROPONINI:3 in the last 72 hours No components found with this basename: POCBNP:3 No results found for this basename: DDIMER:2 in the last 72 hours No results found for this basename: HGBA1C:2 in the last 72 hours No results found for this basename: CHOL:2,HDL:2,LDLCALC:2,TRIG:2,CHOLHDL:2,LDLDIRECT:2 in the last 72 hours No results found for this basename: TSH,T4TOTAL,FREET3,T3FREE,THYROIDAB in the last 72 hours No results found for this basename:  VITAMINB12:2,FOLATE:2,FERRITIN:2,TIBC:2,IRON:2,RETICCTPCT:2 in the last 72 hours  Micro Results: Recent Results (from the past 240 hour(s))  CULTURE, BLOOD (ROUTINE X 2)     Status: Normal   Collection Time   02/14/11  9:30 AM      Component Value Range Status Comment   Specimen Description BLOOD RIGHT WRIST   Final    Special Requests BOTTLES DRAWN AEROBIC AND ANAEROBIC 5CC   Final    Setup Time [id number]   Final    Culture NO GROWTH 5 DAYS   Final    Report Status 02/20/2011 FINAL   Final   CULTURE, BLOOD (ROUTINE X 2)     Status: Normal   Collection Time   02/14/11  9:30 AM      Component Value Range Status Comment   Specimen Description BLOOD LEFT WRIST   Final    Special Requests BOTTLES DRAWN AEROBIC AND ANAEROBIC 5CC   Final    Setup Time [id number]   Final    Culture NO GROWTH 5 DAYS   Final    Report Status 02/20/2011 FINAL   Final   MRSA PCR SCREENING     Status: Abnormal   Collection Time   02/14/11  4:56 PM      Component Value Range Status Comment   MRSA by PCR POSITIVE (*)  NEGATIVE  Final   RAPID STREP SCREEN     Status: Normal   Collection Time   02/21/11  3:00 AM      Component Value Range Status Comment   Streptococcus, Group A Screen (Direct) NEGATIVE  NEGATIVE  Final   MRSA PCR SCREENING     Status: Normal   Collection Time   02/21/11 11:51 AM      Component Value Range Status Comment   MRSA by PCR NEGATIVE  NEGATIVE  Final     Studies/Results: Dg Chest 2 View  02/21/2011  *RADIOLOGY REPORT*  Clinical Data: Cough and fever.  CHEST - 2 VIEW  Comparison: Chest radiograph performed 02/17/2011  Findings: The lungs are well-aerated.  Patchy bibasilar airspace opacities raise question for pneumonia, slightly more apparent than on the prior study.  There is no evidence of pleural effusion or pneumothorax.  The heart is borderline normal in size; a pacemaker is noted at the left chest wall, with a single lead ending at the right ventricle. No acute osseous  abnormalities are seen.  IMPRESSION: Patchy bibasilar airspace opacities raise concern for pneumonia.  Original Report Authenticated By: Tonia Ghent, M.D.    Medications: I have reviewed the patient's current medications. Scheduled Meds:   . albuterol  2.5 mg Nebulization TID  . antiseptic oral rinse  15 mL Mouth Rinse q12n4p  . arformoterol  15 mcg Nebulization BID  . aspirin  81 mg Oral QPM  . beta carotene w/minerals  1 tablet Oral Daily  . bethanechol  10 mg Oral TID  . budesonide  0.5 mg Nebulization BID  . calcium-vitamin D  1 tablet Oral BID  . chlorhexidine  15 mL Mouth Rinse BID  . cholecalciferol  2,000 Units Oral Daily  . digoxin  125 mcg Oral Q0700  . diltiazem  240 mg Oral Daily  . fluconazole  100 mg Oral Daily  . furosemide  40 mg Oral Q0700  . guaiFENesin  10 mL Oral Q6H  . moxifloxacin  400 mg Oral q1800  . oseltamivir  75 mg Oral BID  . pantoprazole  80 mg Oral Q1200  . polyethylene glycol  17 g Oral Daily  . potassium chloride  10 mEq Intravenous Q1 Hr x 3  . predniSONE  60 mg Oral Q breakfast  . senna-docusate  1 tablet Oral QHS  . sodium chloride  3 mL Intravenous Q12H  . vitamin A & D      . warfarin  1 mg Oral ONCE-1800  . warfarin  1 mg Oral ONCE-1800   Continuous Infusions:   . sodium chloride 250 mL (02/22/11 0800)   PRN Meds:.sodium chloride, acetaminophen, acetaminophen, albuterol, food thickener, guaiFENesin, HYDROcodone-acetaminophen, ipratropium, LORazepam, magic mouthwash w/lidocaine, phenol, promethazine  Assessment/Plan: 1. Sore throat. Likely from recent influenza infection completed course of Tamiflu. Continue fluconazole to 02/23/2011 to complete a three-day course. Continue oral care.   2. Generalized weakness and deconditioning. Continue PT. Likely will need HHPT, declined SNF.   3. Respiratory failure, acute-on-chronic: Secondary to COPD exacerbation due to influenza and pneumonia. Continue steroid taper, continue Tamiflu, continue  antibiotics (moxifloxacin orally). Antibiotics till 02/23/2011. Continue oxygen 2L (home is 3L) at discharge.   4. Atrial fibrillation. Continue digoxin and diltiazem. Coumadin per pharmacy. INR now therapeutic.   5. Dysphagia. Continue thick liquids. Continue solid (regular) consistency as per speech therapy.   6. GERD: continue PPI.   7. MRSA: continue mupirocin and chlorhexidine cloths.   8. Constipation: Continue miralax and  sennokot. Resolved.   9. Urinary retention: Continue the patient on home bethanechol.   10. Upper respiratory viral infection/Influenza: Completed Tamiflu.  11. Prophylaxis. Continue Coumadin, INR therapeutic.   12. CODE STATUS. Limited. Do not intubate.    LOS: 1 day  Calliope Delangel A, MD 02/22/2011, 3:42 PM

## 2011-02-22 NOTE — Progress Notes (Signed)
ANTICOAGULATION CONSULT NOTE - Follow Up Consult  Pharmacy Consult for Coumadin Indication: Atrial Fibrillation  Allergies  Allergen Reactions  . Sulfonamide Derivatives Hives  . Penicillins Itching and Rash    Patient Measurements: Height: 5\' 2"  (157.5 cm) Weight: 153 lb (69.4 kg) (per nt, on bed scale ) IBW/kg (Calculated) : 50.1    Vital Signs: Temp: 98.5 F (36.9 C) (12/29 0546) Temp src: Oral (12/29 0546) BP: 146/64 mmHg (12/29 0546) Pulse Rate: 67  (12/29 0629)  Labs:  Basename 02/22/11 0400 02/21/11 1000 02/21/11 0114 02/20/11 0450  HGB 10.9* -- 11.7* --  HCT 33.6* -- 36.1 --  PLT 247 -- 252 --  APTT -- -- -- --  LABPROT 24.7* 24.4* -- 30.4*  INR 2.19* 2.15* -- 2.85*  HEPARINUNFRC -- -- -- --  CREATININE 0.91 -- 1.12* --  CKTOTAL -- -- -- --  CKMB -- -- -- --  TROPONINI -- -- -- --   Estimated Creatinine Clearance: 33.7 ml/min (by C-G formula based on Cr of 0.91).   Medications:  Scheduled:    . albuterol  2.5 mg Nebulization TID  . antiseptic oral rinse  15 mL Mouth Rinse q12n4p  . arformoterol  15 mcg Nebulization BID  . aspirin  81 mg Oral QPM  . beta carotene w/minerals  1 tablet Oral Daily  . bethanechol  10 mg Oral TID  . budesonide  0.5 mg Nebulization BID  . calcium-vitamin D  1 tablet Oral BID  . chlorhexidine  15 mL Mouth Rinse BID  . cholecalciferol  2,000 Units Oral Daily  . digoxin  125 mcg Oral Q0700  . diltiazem  240 mg Oral Daily  . fluconazole (DIFLUCAN) IV  100 mg Intravenous Q24H   Followed by  . fluconazole  100 mg Oral Daily  . furosemide  40 mg Oral Q0700  . guaiFENesin  10 mL Oral Q6H  . moxifloxacin  400 mg Oral q1800  . oseltamivir  75 mg Oral BID  . pantoprazole  80 mg Oral Q1200  . polyethylene glycol  17 g Oral Daily  . potassium chloride  10 mEq Intravenous Q1 Hr x 3  . predniSONE  60 mg Oral Q breakfast  . senna-docusate  1 tablet Oral QHS  . sodium chloride  3 mL Intravenous Q12H  . vitamin A & D      .  warfarin  1 mg Oral ONCE-1800  . DISCONTD: Fluticasone-Salmeterol  1 puff Inhalation BID  . DISCONTD: guaiFENesin  600 mg Oral BID   Infusions:    . sodium chloride 250 mL (02/22/11 0800)   PRN: sodium chloride, acetaminophen, acetaminophen, albuterol, food thickener, guaiFENesin, HYDROcodone-acetaminophen, ipratropium, LORazepam, magic mouthwash w/lidocaine, phenol, promethazine  Assessment:  77 yo F on chronic coumadin for A.Fib  Pt. Was discharged from [facility] on 12/27 with INR 2.85. During this previous admission, pt received coumadin 2 mg x 2 doses and INR became supratherapeutic up to 5.52. No coumadin was given from 12/23 to discharge date.   Home regimen PTA to previous admission: 2 mg po daily.  INR therapeutic today and stable  2.19 (2.15) after 1 mg last night  Interaction noted with Avelox, Tamiflu, Fluconazole  Goal of Therapy:  INR 2-3   Plan:  1.) Repeat coumadin 1 mg po x 1 at 1800 2.) Monitor drug interactions 3.) f/u am PT/INR  Nahun Kronberg, Loma Messing PharmD 10:58 AM 02/22/2011

## 2011-02-22 NOTE — Progress Notes (Signed)
Subjective: Pt is comfortable with no increased wob.  Still with cough and congestion.  afeb on abx.  Objective: Vital signs in last 24 hours: Blood pressure 146/64, pulse 67, temperature 98.5 F (36.9 C), temperature source Oral, resp. rate 18, height 5\' 2"  (1.575 m), weight 69.4 kg (153 lb), SpO2 94.00%.  Intake/Output from previous day: 12/28 0701 - 12/29 0700 In: 123 [P.O.:120; I.V.:3] Out: 327 [Urine:325; Stool:2]   Physical Exam:   frail appearing female in nad Chest with basilar crackles, no wheezing Cor with mild tachy  abd soft and nontender, bs+ Alert, oriented, moves all 4.    Lab Results:  Basename 02/22/11 0400 02/21/11 0114  WBC 12.6* 17.5*  HGB 10.9* 11.7*  HCT 33.6* 36.1  PLT 247 252   BMET  Basename 02/22/11 0400 02/21/11 0114  NA 138 139  K 3.0* 3.7  CL 95* 94*  CO2 37* 36*  GLUCOSE 117* 159*  BUN 19 27*  CREATININE 0.91 1.12*  CALCIUM 8.5 9.1    Studies/Results: Dg Chest 2 View  02/21/2011  *RADIOLOGY REPORT*  Clinical Data: Cough and fever.  CHEST - 2 VIEW  Comparison: Chest radiograph performed 02/17/2011  Findings: The lungs are well-aerated.  Patchy bibasilar airspace opacities raise question for pneumonia, slightly more apparent than on the prior study.  There is no evidence of pleural effusion or pneumothorax.  The heart is borderline normal in size; a pacemaker is noted at the left chest wall, with a single lead ending at the right ventricle. No acute osseous abnormalities are seen.  IMPRESSION: Patchy bibasilar airspace opacities raise concern for pneumonia.  Original Report Authenticated By: Tonia Ghent, M.D.    Assessment/Plan: Patient Active Hospital Problem List:  Pneumonia post influenza Pt seems to be doing well on current abx, with nothing to suggest staph superinfection.    C O P D (03/03/2007)  pt has no increased wob, and no wheezing on exam.  Would continue current BD, and start to wean steroids.  Respiratory failure,  acute-on-chronic (02/14/2011)   Adequate oxygenation on supplemental oxygen.  Will see again on Monday, please call if questions arise.     Barbaraann Share, M.D. 02/22/2011, 2:33 PM

## 2011-02-23 LAB — BASIC METABOLIC PANEL
Chloride: 95 mEq/L — ABNORMAL LOW (ref 96–112)
GFR calc non Af Amer: 51 mL/min — ABNORMAL LOW (ref >90)
Glucose, Bld: 135 mg/dL — ABNORMAL HIGH (ref 70–99)
Potassium: 3.3 mEq/L — ABNORMAL LOW (ref 3.5–5.1)
Sodium: 136 mEq/L (ref 135–145)

## 2011-02-23 MED ORDER — POTASSIUM CHLORIDE CRYS ER 20 MEQ PO TBCR
20.0000 meq | EXTENDED_RELEASE_TABLET | Freq: Once | ORAL | Status: AC
  Administered 2011-02-23: 20 meq via ORAL
  Filled 2011-02-23: qty 1

## 2011-02-23 MED ORDER — WARFARIN SODIUM 1 MG PO TABS
1.0000 mg | ORAL_TABLET | Freq: Once | ORAL | Status: AC
  Administered 2011-02-23: 1 mg via ORAL
  Filled 2011-02-23: qty 1

## 2011-02-23 MED ORDER — HYDROCODONE-ACETAMINOPHEN 5-325 MG PO TABS
0.5000 | ORAL_TABLET | Freq: Four times a day (QID) | ORAL | Status: DC | PRN
  Administered 2011-02-23: 1 via ORAL
  Filled 2011-02-23: qty 1

## 2011-02-23 MED ORDER — DILTIAZEM HCL 60 MG PO TABS
60.0000 mg | ORAL_TABLET | Freq: Four times a day (QID) | ORAL | Status: DC
  Administered 2011-02-24 (×3): 60 mg via ORAL
  Filled 2011-02-23 (×10): qty 1

## 2011-02-23 NOTE — Progress Notes (Signed)
Physical Therapy Evaluation Patient Details Name: Peggy Harvey MRN: [id number] DOB: [birth year] Today's Date: 02/23/2011 1400-1430 EVII  Pt with readmission to hospital for continued weakness and sore throat.  Daughter continues to want to take patient home.  She has been instructed in some exercises and suggestions for scheduling exercise/rest breaks and to use w/c to assist getting patient into home. Pt will benefit from HHPT  Follow up.  Problem List:  Patient Active Problem List  Diagnoses  . HYPERTENSION  . Atrial fibrillation  . C O P D  . DYSPNEA  . Cough  . Respiratory failure, acute-on-chronic  . Flu-like symptoms  . Dysphagia  . Sore throat  . Weakness generalized  . Hypokalemia  . Pneumonia    Past Medical History:  Past Medical History  Diagnosis Date  . Femoral artery occlusion   . Coronary artery disease   . COPD (chronic obstructive pulmonary disease)   . Atrial fibrillation   . Dysphagia     pills have to be crushed and given with applesauce  . Arthritis   . Degenerative arthritis of spine   . Pneumonia     hx aspiraton pna  . Bleeding nose Dec. 2012  . H/O hiatal hernia   . GERD (gastroesophageal reflux disease)   . DDD (degenerative disc disease)   . Osteoarthritis   . Peripheral vascular disease   . ESBL (extended spectrum beta-lactamase) producing bacteria infection   . Constipation   . Gangrene     of the bowels  . CHF (congestive heart failure)   . Hyperlipidemia   . Dysphagia    Past Surgical History:  Past Surgical History  Procedure Date  . Pacemaker insertion   . Abdominal hysterectomy   . Appendectomy   . Stomach surgery 1940's    stomach surgery due to gangrene  . Cholecystectomy   . Cataract extraction, bilateral     PT Assessment/Plan/Recommendation PT Assessment Clinical Impression Statement: Pt continues to have decreaed activity tolerance with SOB even on O2.  She should continue slow progress at home with  daughter's assist and HHPT with short bouts of activity/ exericise and rest breaks Daughter acknowleges exercises and plan PT Recommendation/Assessment: Patient will need skilled PT in the acute care venue PT Problem List: Decreased strength;Decreased activity tolerance;Decreased mobility;Cardiopulmonary status limiting activity;Pain Barriers to Discharge: None PT Therapy Diagnosis : Difficulty walking;Generalized weakness;Acute pain PT Plan PT Frequency: Min 3X/week PT Treatment/Interventions: DME instruction;Gait training;Functional mobility training;Therapeutic exercise;Patient/family education PT Recommendation Recommendations for Other Services:  (none) Follow Up Recommendations: Home health PT Equipment Recommended: None recommended by PT PT Goals  Acute Rehab PT Goals PT Goal Formulation: With patient Time For Goal Achievement: 2 weeks Pt will go Supine/Side to Sit: with supervision PT Goal: Supine/Side to Sit - Progress: Not met Pt will go Sit to Supine/Side: with supervision PT Goal: Sit to Supine/Side - Progress: Not met Pt will go Sit to Stand: with supervision PT Goal: Sit to Stand - Progress: Not met Pt will Ambulate: 16 - 50 feet PT Goal: Ambulate - Progress: Not met Pt will Perform Home Exercise Program: with supervision, verbal cues required/provided PT Goal: Perform Home Exercise Program - Progress: Not met  PT Evaluation Precautions/Restrictions    Prior Functioning  Home Living Lives With: Daughter Receives Help From: Family Type of Home: House Home Layout: One level Home Access: Stairs to enter [employer] of Steps: 1 Home Adaptive Equipment: Wheelchair - [employer] seat with rails (rollator) Additional Comments: home  O2 Prior Function Level of Independence: Needs assistance with ADLs;Needs assistance with gait Cognition Cognition Arousal/Alertness: Awake/alert Overall Cognitive Status: Appears within functional limits for tasks  assessed Orientation Level: Oriented X4 Sensation/Coordination Sensation Light Touch: Appears Intact Extremity Assessment RLE Assessment RLE Assessment: Exceptions to [facility] RLE Strength RLE Overall Strength Comments: generalized muscle atrophy Mobility (including Balance) Bed Mobility Supine to Sit Details (indicate cue type and reason): pt up in chair per nursing Transfers Sit to Stand: 4: Min assist;With armrests;From chair/3-in-1 Sit to Stand Details (indicate cue type and reason): cues to push up with armrests Stand to Sit: 4: Min assist Stand to Sit Details: to control descent Ambulation/Gait Ambulation/Gait Assistance: 4: Min assist Ambulation/Gait Assistance Details (indicate cue type and reason): pt able to walk 5 feet forward and 5 back with RW, needs cues for trunk extenstion pt with significan SOB with activity that takes several minutes to dissipate, even with supplemental O2 moist sounding non productive cough Ambulation Distance (Feet): 10 Feet Assistive device: Rolling walker Gait Pattern: Decreased step length - right;Decreased step length - left;Decreased stride length;Trunk flexed;Lateral trunk lean to right (pt with fixed trunk scoliosis deformity) Wheelchair Mobility Wheelchair Mobility:  (daugghter adivised to use W/C to get pt into home)  Posture/Postural Control Posture/Postural Control: Postural limitations Postural Limitations: Pt appears to have fixed scoliosis with flexion deformity.  Did  not specifically evaluate Exercise  Other Exercises Other Exercises: bilateral U/E hip to hip exercises for core activation, standing with RW for one minute .  Standing with walker with unilaterl arm raise, glute sets for  strengthing Other Exercises: Reviewed with daughter to schedule activities for the day with exercise and rest periods built in End of Session PT - End of Session Equipment Utilized During Treatment:  (RW, O2) Activity Tolerance: Treatment limited  secondary to medical complications (Comment) (aacivity limited by SOB) Patient left: in chair Nurse Communication: Mobility status for transfers General Behavior During Session: [facility] for tasks performed Cognition: [facility] for tasks performed  Donnetta Hail 02/23/2011, 3:05 PM

## 2011-02-23 NOTE — Progress Notes (Signed)
Rt placed face tent on pt per MD request.  O2 sats 93% on 35%.  HR 62.  Pt tolerating well at this time.

## 2011-02-23 NOTE — Progress Notes (Signed)
ANTICOAGULATION CONSULT NOTE - Follow Up Consult  Pharmacy Consult for coumadin Indication: Atrial Fibrillation  Allergies  Allergen Reactions  . Sulfonamide Derivatives Hives  . Penicillins Itching and Rash    Patient Measurements: Height: 5\' 2"  (157.5 cm) Weight: 153 lb (69.4 kg) IBW/kg (Calculated) : 50.1    Vital Signs: Temp: 97.7 F (36.5 C) (12/30 0605) Temp src: Oral (12/30 0605) BP: 160/69 mmHg (12/30 0605) Pulse Rate: 65  (12/30 0605)  Labs:  Basename 02/23/11 0330 02/22/11 0400 02/21/11 1000 02/21/11 0114  HGB -- 10.9* -- 11.7*  HCT -- 33.6* -- 36.1  PLT -- 247 -- 252  APTT -- -- -- --  LABPROT 25.9* 24.7* 24.4* --  INR 2.32* 2.19* 2.15* --  HEPARINUNFRC -- -- -- --  CREATININE 0.92 0.91 -- 1.12*  CKTOTAL -- -- -- --  CKMB -- -- -- --  TROPONINI -- -- -- --   Estimated Creatinine Clearance: 33.4 ml/min (by C-G formula based on Cr of 0.92).   Medications:  Scheduled:    . albuterol  2.5 mg Nebulization TID  . antiseptic oral rinse  15 mL Mouth Rinse q12n4p  . arformoterol  15 mcg Nebulization BID  . aspirin  81 mg Oral QPM  . beta carotene w/minerals  1 tablet Oral Daily  . budesonide  0.5 mg Nebulization BID  . calcium-vitamin D  1 tablet Oral BID  . chlorhexidine  15 mL Mouth Rinse BID  . cholecalciferol  2,000 Units Oral Daily  . digoxin  125 mcg Oral Q0700  . diltiazem  60 mg Oral Q6H  . fluconazole  100 mg Oral Daily  . furosemide  40 mg Oral Q0700  . guaiFENesin  10 mL Oral Q6H  . moxifloxacin  400 mg Oral q1800  . pantoprazole  80 mg Oral Q1200  . polyethylene glycol  17 g Oral Daily  . potassium chloride  20 mEq Oral Once  . predniSONE  40 mg Oral Q breakfast  . senna-docusate  1 tablet Oral QHS  . sodium chloride  3 mL Intravenous Q12H  . warfarin  1 mg Oral ONCE-1800  . DISCONTD: bethanechol  10 mg Oral TID  . DISCONTD: diltiazem  240 mg Oral Daily  . DISCONTD: guaiFENesin  10 mL Oral Q6H  . DISCONTD: predniSONE  60 mg Oral Q  breakfast   Infusions:    . sodium chloride 250 mL (02/22/11 0800)   PRN: sodium chloride, acetaminophen, acetaminophen, albuterol, bethanechol, food thickener, guaiFENesin, HYDROcodone-acetaminophen, ipratropium, LORazepam, magic mouthwash w/lidocaine, phenol, promethazine, DISCONTD: HYDROcodone-acetaminophen  Assessment: 77 yo F on chronic coumadin for A.Fib INR therapeutic today, trending up slightly, and unclear if dose was given last night as it is not charted.  Pt. Was discharged from [facility] on 12/27 with INR 2.85. During this previous admission, pt received coumadin 2 mg x 2 doses and INR became supratherapeutic up to 5.52. No coumadin was given from 12/23 to discharge date.  Home regimen PTA to previous admission: 2 mg po daily. Potential Drug interactions: Avelox until 12/31; fluconazole d/c'd today.  Goal of Therapy:  INR 2-3   Plan:  1.) Coumadin 1 mg po x 1 at 1800 2.) follow up AM INR  Kamren Heintzelman, Loma Messing PharmD 12:45 PM 02/23/2011

## 2011-02-23 NOTE — Consult Note (Signed)
Reason for Consult: Sore throat Referring Physician: Dr. Benjie Karvonen is an 77 y.o. female.  HPI: 77 year old who's here for evaluation and treatment for flu and now persists with a sore throat. She has had the sore throat since early part of December. She's had this problem previously a while back it was secondary to drying. Humidified air resolved the issue. She has difficulty with some discomfort mostly on the left side but it is diffuse. She has a normal voice. She has no epistaxis. She is able to get fluids down but it's uncomfortable drinking. She is on O2.  Past Medical History  Diagnosis Date  . Femoral artery occlusion   . Coronary artery disease   . COPD (chronic obstructive pulmonary disease)   . Atrial fibrillation   . Dysphagia     pills have to be crushed and given with applesauce  . Arthritis   . Degenerative arthritis of spine   . Pneumonia     hx aspiraton pna  . Bleeding nose Dec. 2012  . H/O hiatal hernia   . GERD (gastroesophageal reflux disease)   . DDD (degenerative disc disease)   . Osteoarthritis   . Peripheral vascular disease   . ESBL (extended spectrum beta-lactamase) producing bacteria infection   . Constipation   . Gangrene     of the bowels  . CHF (congestive heart failure)   . Hyperlipidemia   . Dysphagia     Past Surgical History  Procedure Date  . Pacemaker insertion   . Abdominal hysterectomy   . Appendectomy   . Stomach surgery 1940's    stomach surgery due to gangrene  . Cholecystectomy   . Cataract extraction, bilateral     History reviewed. No pertinent family history.  Social History:  reports that she quit smoking about 22 years ago. Her smoking use included Cigarettes. She has a 25 pack-year smoking history. She has never used smokeless tobacco. She reports that she does not drink alcohol or use illicit drugs.  Allergies:  Allergies  Allergen Reactions  . Sulfonamide Derivatives Hives  . Penicillins Itching and  Rash    Medications: I have reviewed the patient's current medications.  Results for orders placed during the hospital encounter of 02/21/11 (from the past 48 hour(s))  BASIC METABOLIC PANEL     Status: Abnormal   Collection Time   02/22/11  4:00 AM      Component Value Range Comment   Sodium 138  135 - 145 (mEq/L)    Potassium 3.0 (*) 3.5 - 5.1 (mEq/L)    Chloride 95 (*) 96 - 112 (mEq/L)    CO2 37 (*) 19 - 32 (mEq/L)    Glucose, Bld 117 (*) 70 - 99 (mg/dL)    BUN 19  6 - 23 (mg/dL)    Creatinine, Ser 9.60  0.50 - 1.10 (mg/dL)    Calcium 8.5  8.4 - 10.5 (mg/dL)    GFR calc non Af Amer 52 (*) >90 (mL/min)    GFR calc Af Amer 60 (*) >90 (mL/min)   CBC     Status: Abnormal   Collection Time   02/22/11  4:00 AM      Component Value Range Comment   WBC 12.6 (*) 4.0 - 10.5 (K/uL)    RBC 3.76 (*) 3.87 - 5.11 (MIL/uL)    Hemoglobin 10.9 (*) 12.0 - 15.0 (g/dL)    HCT 45.4 (*) 09.8 - 46.0 (%)    MCV 89.4  78.0 -  100.0 (fL)    MCH 29.0  26.0 - 34.0 (pg)    MCHC 32.4  30.0 - 36.0 (g/dL)    RDW 14.7  82.9 - 56.2 (%)    Platelets 247  150 - 400 (K/uL)   PROTIME-INR     Status: Abnormal   Collection Time   02/22/11  4:00 AM      Component Value Range Comment   Prothrombin Time 24.7 (*) 11.6 - 15.2 (seconds)    INR 2.19 (*) 0.00 - 1.49    MAGNESIUM     Status: Normal   Collection Time   02/22/11  7:00 AM      Component Value Range Comment   Magnesium 2.0  1.5 - 2.5 (mg/dL)   GLUCOSE, CAPILLARY     Status: Abnormal   Collection Time   02/22/11  7:46 AM      Component Value Range Comment   Glucose-Capillary 119 (*) 70 - 99 (mg/dL)   PROTIME-INR     Status: Abnormal   Collection Time   02/23/11  3:30 AM      Component Value Range Comment   Prothrombin Time 25.9 (*) 11.6 - 15.2 (seconds)    INR 2.32 (*) 0.00 - 1.49    BASIC METABOLIC PANEL     Status: Abnormal   Collection Time   02/23/11  3:30 AM      Component Value Range Comment   Sodium 136  135 - 145 (mEq/L)    Potassium  3.3 (*) 3.5 - 5.1 (mEq/L)    Chloride 95 (*) 96 - 112 (mEq/L)    CO2 37 (*) 19 - 32 (mEq/L)    Glucose, Bld 135 (*) 70 - 99 (mg/dL)    BUN 18  6 - 23 (mg/dL)    Creatinine, Ser 1.30  0.50 - 1.10 (mg/dL)    Calcium 8.2 (*) 8.4 - 10.5 (mg/dL)    GFR calc non Af Amer 51 (*) >90 (mL/min)    GFR calc Af Amer 59 (*) >90 (mL/min)   MAGNESIUM     Status: Normal   Collection Time   02/23/11  3:30 AM      Component Value Range Comment   Magnesium 2.0  1.5 - 2.5 (mg/dL)     No results found.  ROS Blood pressure 152/66, pulse 64, temperature 98.3 F (36.8 C), temperature source Oral, resp. rate 17, height 5\' 2"  (1.575 m), weight 69.4 kg (153 lb), SpO2 93.00%. Physical Exam  Constitutional: She appears well-developed and well-nourished.  HENT:  Head: Normocephalic.  Nose: Nose normal.       The nose has cannulas within both nostrils for 02. There is some drying. The oral cavity oropharynx has a very dried posterior pharyngeal wall with dried secretions apparently draining from the nasopharynx. There is no swelling or excessive erythema. There's no lesions identified. Her voice is normal.  Neck: Normal range of motion. Neck supple.    Assessment/Plan: Sore throat-she has a history of reflux and perhaps some of this difficulty is secondary to that problem and perhaps increasing her proton pump inhibitor to twice a day would be beneficial. She also has a very dry posterior pharyngeal wall which may be secondary to O2 or medications. Humidified air shield may be beneficial as it was in the past. I don't see any lesions other than the dried crusted secretions on the posterior pharyngeal wall. If she persists with this I recommend followup in the office for a fiberoptic examination of her pharynx  and larynx.  Ginny Loomer M 02/23/2011, 4:41 PM

## 2011-02-23 NOTE — Progress Notes (Signed)
Subjective: No specific complaints. Denies any pain except still having difficulty swallowing.  Objective: Vital signs in last 24 hours: Filed Vitals:   02/22/11 1454 02/22/11 2000 02/22/11 2100 02/23/11 0605  BP: 145/79 146/69  160/69  Pulse: 77 65  65  Temp: 97.7 F (36.5 C) 97.3 F (36.3 C)  97.7 F (36.5 C)  TempSrc: Oral Oral  Oral  Resp: 18 18  18   Height:      Weight:    69.4 kg (153 lb)  SpO2: 98% 94% 95% 97%   Weight change: 0 kg (0 oz)  Intake/Output Summary (Last 24 hours) at 02/23/11 1249 Last data filed at 02/23/11 0605  Gross per 24 hour  Intake    103 ml  Output    150 ml  Net    -47 ml    Physical Exam: General: Awake, Oriented, No acute distress. HEENT: EOMI, posterior oropharynx dry, no white exudates or lesions noted. Neck: Supple CV: S1 and S2 Lungs: Scattered wheezing at bases.  Good respiratory effort. Abdomen: Soft, Nontender, Nondistended, +bowel sounds. Ext: Good pulses. Trace edema.  Lab Results:  Basename 02/23/11 0330 02/22/11 0700 02/22/11 0400  NA 136 -- 138  K 3.3* -- 3.0*  CL 95* -- 95*  CO2 37* -- 37*  GLUCOSE 135* -- 117*  BUN 18 -- 19  CREATININE 0.92 -- 0.91  CALCIUM 8.2* -- 8.5  MG 2.0 2.0 --  PHOS -- -- --    Basename 02/21/11 0114  AST 21  ALT 23  ALKPHOS 56  BILITOT 0.6  PROT 6.3  ALBUMIN 2.9*   No results found for this basename: LIPASE:2,AMYLASE:2 in the last 72 hours  Basename 02/22/11 0400 02/21/11 0114  WBC 12.6* 17.5*  NEUTROABS -- 14.7*  HGB 10.9* 11.7*  HCT 33.6* 36.1  MCV 89.4 90.5  PLT 247 252   No results found for this basename: CKTOTAL:3,CKMB:3,CKMBINDEX:3,TROPONINI:3 in the last 72 hours No components found with this basename: POCBNP:3 No results found for this basename: DDIMER:2 in the last 72 hours No results found for this basename: HGBA1C:2 in the last 72 hours No results found for this basename: CHOL:2,HDL:2,LDLCALC:2,TRIG:2,CHOLHDL:2,LDLDIRECT:2 in the last 72 hours No results found  for this basename: TSH,T4TOTAL,FREET3,T3FREE,THYROIDAB in the last 72 hours No results found for this basename: VITAMINB12:2,FOLATE:2,FERRITIN:2,TIBC:2,IRON:2,RETICCTPCT:2 in the last 72 hours  Micro Results: Recent Results (from the past 240 hour(s))  CULTURE, BLOOD (ROUTINE X 2)     Status: Normal   Collection Time   02/14/11  9:30 AM      Component Value Range Status Comment   Specimen Description BLOOD RIGHT WRIST   Final    Special Requests BOTTLES DRAWN AEROBIC AND ANAEROBIC 5CC   Final    Setup Time [id number]   Final    Culture NO GROWTH 5 DAYS   Final    Report Status 02/20/2011 FINAL   Final   CULTURE, BLOOD (ROUTINE X 2)     Status: Normal   Collection Time   02/14/11  9:30 AM      Component Value Range Status Comment   Specimen Description BLOOD LEFT WRIST   Final    Special Requests BOTTLES DRAWN AEROBIC AND ANAEROBIC 5CC   Final    Setup Time [id number]   Final    Culture NO GROWTH 5 DAYS   Final    Report Status 02/20/2011 FINAL   Final   MRSA PCR SCREENING     Status: Abnormal   Collection Time  02/14/11  4:56 PM      Component Value Range Status Comment   MRSA by PCR POSITIVE (*) NEGATIVE  Final   RAPID STREP SCREEN     Status: Normal   Collection Time   02/21/11  3:00 AM      Component Value Range Status Comment   Streptococcus, Group A Screen (Direct) NEGATIVE  NEGATIVE  Final   MRSA PCR SCREENING     Status: Normal   Collection Time   02/21/11 11:51 AM      Component Value Range Status Comment   MRSA by PCR NEGATIVE  NEGATIVE  Final     Studies/Results: No results found.  Medications: I have reviewed the patient's current medications. Scheduled Meds:    . albuterol  2.5 mg Nebulization TID  . antiseptic oral rinse  15 mL Mouth Rinse q12n4p  . arformoterol  15 mcg Nebulization BID  . aspirin  81 mg Oral QPM  . beta carotene w/minerals  1 tablet Oral Daily  . budesonide  0.5 mg Nebulization BID  . calcium-vitamin D  1 tablet Oral BID  .  chlorhexidine  15 mL Mouth Rinse BID  . cholecalciferol  2,000 Units Oral Daily  . digoxin  125 mcg Oral Q0700  . diltiazem  60 mg Oral Q6H  . fluconazole  100 mg Oral Daily  . furosemide  40 mg Oral Q0700  . guaiFENesin  10 mL Oral Q6H  . moxifloxacin  400 mg Oral q1800  . pantoprazole  80 mg Oral Q1200  . polyethylene glycol  17 g Oral Daily  . potassium chloride  20 mEq Oral Once  . predniSONE  40 mg Oral Q breakfast  . senna-docusate  1 tablet Oral QHS  . sodium chloride  3 mL Intravenous Q12H  . warfarin  1 mg Oral ONCE-1800  . warfarin  1 mg Oral ONCE-1800  . DISCONTD: bethanechol  10 mg Oral TID  . DISCONTD: diltiazem  240 mg Oral Daily  . DISCONTD: guaiFENesin  10 mL Oral Q6H  . DISCONTD: predniSONE  60 mg Oral Q breakfast   Continuous Infusions:    . sodium chloride 250 mL (02/22/11 0800)   PRN Meds:.sodium chloride, acetaminophen, acetaminophen, albuterol, bethanechol, food thickener, guaiFENesin, HYDROcodone-acetaminophen, ipratropium, LORazepam, magic mouthwash w/lidocaine, phenol, promethazine, DISCONTD: HYDROcodone-acetaminophen  Assessment/Plan: 1. Sore throat. Likely from recent influenza infection completed course of Tamiflu. Continue fluconazole for possible thrush till 02/23/2011 to complete a three-day course. Continue oral care.   2. Generalized weakness and deconditioning. Continue PT. Likely will need HHPT, declined SNF.   3. Respiratory failure, acute-on-chronic: Secondary to COPD exacerbation due to influenza and pneumonia. Continue steroid taper, continue Tamiflu, continue antibiotics (moxifloxacin orally). Antibiotics till 02/23/2011. Continue oxygen 2L (home is 3L) at discharge.   4. Atrial fibrillation. Continue digoxin and diltiazem. Coumadin per pharmacy. INR therapeutic.   5. Dysphagia. Continue thick liquids. Continue solid (regular) consistency as per speech therapy.   6. GERD: continue PPI.   7. MRSA: continue mupirocin and chlorhexidine  cloths.   8. Constipation: Continue miralax and sennokot. Resolved.   9. Urinary retention: Continue the patient on home bethanechol.   10. Upper respiratory viral infection/Influenza: Completed Tamiflu.  11. Prophylaxis. Continue Coumadin, INR therapeutic.   12. CODE STATUS. Limited. Do not intubate.   13.  Disposition.  Plan for discharge home with home health tomorrow.   LOS: 2 days  Jontay Maston A, MD 02/23/2011, 12:49 PM

## 2011-02-24 LAB — PROTIME-INR: INR: 2.3 — ABNORMAL HIGH (ref 0.00–1.49)

## 2011-02-24 LAB — BASIC METABOLIC PANEL
CO2: 38 mEq/L — ABNORMAL HIGH (ref 19–32)
Calcium: 8.6 mg/dL (ref 8.4–10.5)
Creatinine, Ser: 1.12 mg/dL — ABNORMAL HIGH (ref 0.50–1.10)
Glucose, Bld: 128 mg/dL — ABNORMAL HIGH (ref 70–99)

## 2011-02-24 MED ORDER — ARFORMOTEROL TARTRATE 15 MCG/2ML IN NEBU: 15.0000 ug | INHALATION_SOLUTION | Freq: Two times a day (BID) | RESPIRATORY_TRACT | Status: DC

## 2011-02-24 MED ORDER — MAGIC MOUTHWASH W/LIDOCAINE: 5.0000 mL | Freq: Three times a day (TID) | ORAL | Status: DC | PRN

## 2011-02-24 MED ORDER — WARFARIN SODIUM 1 MG PO TABS: 1.0000 mg | ORAL_TABLET | Freq: Every evening | ORAL | Status: DC

## 2011-02-24 MED ORDER — DILTIAZEM HCL 60 MG PO TABS: 60.0000 mg | ORAL_TABLET | Freq: Four times a day (QID) | ORAL | Status: DC

## 2011-02-24 MED ORDER — WARFARIN SODIUM 1 MG PO TABS
1.0000 mg | ORAL_TABLET | Freq: Once | ORAL | Status: DC
  Filled 2011-02-24: qty 1

## 2011-02-24 MED ORDER — PREDNISONE 10 MG PO TABS: ORAL_TABLET | ORAL | Status: DC

## 2011-02-24 MED ORDER — BUDESONIDE 0.5 MG/2ML IN SUSP: 0.5000 mg | Freq: Two times a day (BID) | RESPIRATORY_TRACT | Status: DC

## 2011-02-24 NOTE — Progress Notes (Signed)
This nurse paged the MD this afternoon per patient's daughter's request.  Daughter is concerned that dried secretions won't resolve without the use of the humidifier tent, and said that Respiratory indicated the tent could not be used at home.  Daughter is insisting that the MD be paged so that she can see the ENT again.  MD made aware of the issue.  Respiratory contacted for clarification.  Respiratory stated that the daughter was already advised that a room humidifier at home would provide plenty of moisture to resolve the issue, without the need for the tent humidifier.  The issue with home use of a tent humidifier is that the O2 concentration will not reach 6L as it does in the hospital setting.  However, respiratory already explained to the patient's daughter that the room humidifier is MORE effective than the tent humidifier.  They also advised leaving water on the stove at a low simmer to add moisture to the air.  In addition, home air is generally more humid than ambient air in the hospital setting.  This was also explained to the daughter.  Respiratory said it would be fine for the patient to take the tent apparatus home, if they wished.  MD advised of all of the above when he came to the floor to round.  Roney Mans. RN, BSN 02/24/2011 1:41 PM

## 2011-02-24 NOTE — Plan of Care (Signed)
Problem: Discharge Progression Outcomes Goal: Dyspnea controlled Outcome: Completed/Met Date Met:  02/24/11 Adequate for discharge with home O2.

## 2011-02-24 NOTE — Progress Notes (Signed)
Subjective: Daughter did most of the talking. Daughter thought that patient was doing better. Still having sore throat.  Objective: Vital signs in last 24 hours: Filed Vitals:   02/23/11 1843 02/23/11 2000 02/23/11 2210 02/24/11 0600  BP:  158/75  160/65  Pulse:  67  69  Temp:  97.6 F (36.4 C)  97.9 F (36.6 C)  TempSrc:  Axillary  Axillary  Resp:  19  18  Height:      Weight:    68.493 kg (151 lb)  SpO2: 93% 96% 98% 97%   Weight change: -0.907 kg (-2 lb)  Intake/Output Summary (Last 24 hours) at 02/24/11 1350 Last data filed at 02/24/11 0230  Gross per 24 hour  Intake    143 ml  Output    400 ml  Net   -257 ml    Physical Exam: General: Awake, Oriented, No acute distress. HEENT: EOMI, on face tent. Neck: Supple CV: S1 and S2 Lungs: Scattered wheezing at bases.  Good respiratory effort. Abdomen: Soft, Nontender, Nondistended, +bowel sounds. Ext: Good pulses. Trace edema.  Lab Results:  Basename 02/24/11 0350 02/23/11 0330 02/22/11 0700  NA 137 136 --  K 3.6 3.3* --  CL 95* 95* --  CO2 38* 37* --  GLUCOSE 128* 135* --  BUN 22 18 --  CREATININE 1.12* 0.92 --  CALCIUM 8.6 8.2* --  MG -- 2.0 2.0  PHOS -- -- --   No results found for this basename: AST:2,ALT:2,ALKPHOS:2,BILITOT:2,PROT:2,ALBUMIN:2 in the last 72 hours No results found for this basename: LIPASE:2,AMYLASE:2 in the last 72 hours  Basename 02/22/11 0400  WBC 12.6*  NEUTROABS --  HGB 10.9*  HCT 33.6*  MCV 89.4  PLT 247   No results found for this basename: CKTOTAL:3,CKMB:3,CKMBINDEX:3,TROPONINI:3 in the last 72 hours No components found with this basename: POCBNP:3 No results found for this basename: DDIMER:2 in the last 72 hours No results found for this basename: HGBA1C:2 in the last 72 hours No results found for this basename: CHOL:2,HDL:2,LDLCALC:2,TRIG:2,CHOLHDL:2,LDLDIRECT:2 in the last 72 hours No results found for this basename: TSH,T4TOTAL,FREET3,T3FREE,THYROIDAB in the last 72  hours No results found for this basename: VITAMINB12:2,FOLATE:2,FERRITIN:2,TIBC:2,IRON:2,RETICCTPCT:2 in the last 72 hours  Micro Results: Recent Results (from the past 240 hour(s))  MRSA PCR SCREENING     Status: Abnormal   Collection Time   02/14/11  4:56 PM      Component Value Range Status Comment   MRSA by PCR POSITIVE (*) NEGATIVE  Final   RAPID STREP SCREEN     Status: Normal   Collection Time   02/21/11  3:00 AM      Component Value Range Status Comment   Streptococcus, Group A Screen (Direct) NEGATIVE  NEGATIVE  Final   MRSA PCR SCREENING     Status: Normal   Collection Time   02/21/11 11:51 AM      Component Value Range Status Comment   MRSA by PCR NEGATIVE  NEGATIVE  Final     Studies/Results: No results found.  Medications: I have reviewed the patient's current medications. Scheduled Meds:    . albuterol  2.5 mg Nebulization TID  . antiseptic oral rinse  15 mL Mouth Rinse q12n4p  . arformoterol  15 mcg Nebulization BID  . aspirin  81 mg Oral QPM  . beta carotene w/minerals  1 tablet Oral Daily  . budesonide  0.5 mg Nebulization BID  . calcium-vitamin D  1 tablet Oral BID  . chlorhexidine  15 mL Mouth Rinse  BID  . cholecalciferol  2,000 Units Oral Daily  . digoxin  125 mcg Oral Q0700  . diltiazem  60 mg Oral Q6H  . furosemide  40 mg Oral Q0700  . guaiFENesin  10 mL Oral Q6H  . moxifloxacin  400 mg Oral q1800  . pantoprazole  80 mg Oral Q1200  . polyethylene glycol  17 g Oral Daily  . predniSONE  40 mg Oral Q breakfast  . senna-docusate  1 tablet Oral QHS  . sodium chloride  3 mL Intravenous Q12H  . warfarin  1 mg Oral ONCE-1800  . warfarin  1 mg Oral ONCE-1800  . warfarin  1 mg Oral ONCE-1800   Continuous Infusions:   PRN Meds:.acetaminophen, acetaminophen, albuterol, bethanechol, food thickener, guaiFENesin, HYDROcodone-acetaminophen, ipratropium, LORazepam, magic mouthwash w/lidocaine, phenol, promethazine  Assessment/Plan: 1. Sore throat. Likely  from recent influenza infection completed course of Tamiflu. Completed 3 day course of fluconazole. Continue oral care. Appreciate ENT evaluation, seems to be improved on face tent.  Patient's daughter was very insistent on having ENT reevaluate the patient.  Spoke with Dr. Annalee Genta , ENT covering for Dr. Jearld Fenton, who agreed with Dr. Jearld Fenton outlined plan and recommended the patient comes to the office for further evaluation if need be.  2. Generalized weakness and deconditioning. Continue PT. HHPT, declined SNF.   3. Respiratory failure, acute-on-chronic: Secondary to COPD exacerbation due to influenza and pneumonia. Continue steroid taper, continue Tamiflu, Completed Moxifloxacin. Antibiotics till 02/23/2011. Continue oxygen 2L (home is 3L) at discharge.   4. Atrial fibrillation. Continue digoxin and diltiazem. Coumadin per pharmacy. INR therapeutic.   5. Dysphagia. Continue thick liquids. Continue solid (regular) consistency as per speech therapy.   6. GERD: continue PPI.   7. MRSA: continue mupirocin and chlorhexidine cloths.   8. Constipation: Continue miralax and sennokot. Resolved.   9. Urinary retention: Continue the patient on home bethanechol.   10. Upper respiratory viral infection/Influenza: Completed Tamiflu.  11. Prophylaxis. Continue Coumadin, INR therapeutic.   12. CODE STATUS. Limited. Do not intubate.   13.  Disposition.  Discharge patient home today with HHPT.   LOS: 3 days  Peggy Harvey A, MD 02/24/2011, 1:50 PM

## 2011-02-24 NOTE — Plan of Care (Signed)
Problem: Discharge Progression Outcomes Goal: Able to self administer respiratory meds Outcome: Not Applicable Date Met:  02/24/11 Daughter administers.

## 2011-02-24 NOTE — Discharge Summary (Signed)
Discharge Summary  Peggy Harvey MR#: [id number]  DOB:01-Dec-1913  Date of Admission: 02/21/2011 Date of Discharge: 02/24/2011  Patient's PCP: Gweneth Dimitri, MD, MD  Attending Physician:Deondra Wigger A  Consults: Dr. Sherene Sires, Dr. Vassie Loll - Pulmonary Dr. Jearld Fenton - ENT  Discharge Diagnoses: Principal Problem:  *Sore throat Active Problems:  HYPERTENSION  Campath-induced atrial fibrillation  C O P D  DYSPNEA  Cough  Respiratory failure, acute-on-chronic  Flu-like symptoms  Dysphagia  Weakness generalized  Hypokalemia  Pneumonia  Brief Admitting History and Physical 77 year old Caucasian female readmitted on 02/21/2011 with sore throat and generalized weakness.  Discharge Medications Current Discharge Medication List    START taking these medications   Details  Alum & Mag Hydroxide-Simeth (MAGIC MOUTHWASH W/LIDOCAINE) SOLN Take 5 mLs by mouth 3 (three) times daily as needed (Sore throat.). Qty: 100 mL, Refills: 0    arformoterol (BROVANA) 15 MCG/2ML NEBU Take 2 mLs (15 mcg total) by nebulization 2 (two) times daily. Qty: 120 mL, Refills: 0    budesonide (PULMICORT) 0.5 MG/2ML nebulizer solution Take 2 mLs (0.5 mg total) by nebulization 2 (two) times daily. Qty: 120 mL, Refills: 0    warfarin (COUMADIN) 1 MG tablet Take 1 tablet (1 mg total) by mouth every evening. Qty: 2 tablet, Refills: 0      CONTINUE these medications which have CHANGED   Details  predniSONE (DELTASONE) 10 MG tablet Take 40 mg po qday x2 days, then 20 mg po qday x3 days, then 10 mg po qday x3 days, then 5 mg po qday x3 days then discontinue. Qty: 19 tablet, Refills: 0      CONTINUE these medications which have NOT CHANGED   Details  albuterol (PROVENTIL) (5 MG/ML) 0.5% nebulizer solution Take 0.5 mLs (2.5 mg total) by nebulization every 6 (six) hours as needed for wheezing or shortness of breath. Qty: 20 mL, Refills: 0    Ascorbic Acid (VITAMIN C) 1000 MG tablet Take 4,000 mg by mouth daily.        aspirin 81 MG tablet Take 81 mg by mouth every evening.     bethanechol (URECHOLINE) 10 MG tablet Take 10 mg by mouth 3 (three) times daily.      Calcium Carbonate-Vitamin D (OS-CAL 500 + D) 500-500 MG-UNIT CHEW Chew 1 tablet by mouth 2 (two) times daily.      Cholecalciferol (VITAMIN D) 2000 UNITS tablet Take 2,000 Units by mouth daily.      CRANBERRY EXTRACT PO Take 1 tablet by mouth 2 (two) times daily.      digoxin (LANOXIN) 0.25 MG tablet Take 0.125 mcg by mouth every morning. Pt takes (1/2 tab) for 0.171mcg dose    diltiazem (DILACOR XR) 240 MG 24 hr capsule Take 240 mg by mouth daily.     esomeprazole (NEXIUM) 40 MG capsule Take 40 mg by mouth daily before breakfast.      food thickener (THICK IT) POWD With water/liquids. Qty: 1 Can, Refills: 0    furosemide (LASIX) 40 MG tablet Take 40 mg by mouth every morning.     guaiFENesin (MUCINEX) 600 MG 12 hr tablet Take 600 mg by mouth 2 (two) times daily.      guaiFENesin (ROBITUSSIN) 100 MG/5ML SOLN Take 5 mLs by mouth every 4 (four) hours as needed. cough     hydrochlorothiazide (HYDRODIURIL) 12.5 MG tablet Take 12.5 mg by mouth daily.      HYDROcodone-acetaminophen (NORCO) 5-325 MG per tablet Take 1 tablet by mouth every 6 (six) hours as needed.  pain     ipratropium (ATROVENT) 0.02 % nebulizer solution Take 2.5 mLs (0.5 mg total) by nebulization every 6 (six) hours as needed for wheezing. Qty: 75 mL, Refills: 0    LORazepam (ATIVAN) 1 MG tablet Take 0.5 tablets (0.5 mg total) by mouth 2 (two) times daily as needed for anxiety. Qty: 14 tablet, Refills: 0    Multiple Vitamins-Minerals (MACULAR VITAMIN BENEFIT PO) Take 2 tablets by mouth.      Nutritional Supplements (ESTROVEN PO) Take 1 tablet by mouth daily.      phenol (CHLORASEPTIC) 1.4 % LIQD Use as directed 2 sprays in the mouth or throat every 4 (four) hours as needed (For sore throat.). Qty: 1 Bottle, Refills: 0    promethazine (PHENERGAN) 25 MG tablet Take 25  mg by mouth every 6 (six) hours as needed. nausea     senna-docusate (SENOKOT-S) 8.6-50 MG per tablet Take 1 tablet by mouth at bedtime. Hold for diarrhea. Qty: 30 tablet, Refills: 0      STOP taking these medications     polyethylene glycol (MIRALAX / GLYCOLAX) packet      Fluticasone-Salmeterol (ADVAIR) 250-50 MCG/DOSE AEPB      oseltamivir (TAMIFLU) 75 MG capsule      moxifloxacin (AVELOX) 400 MG tablet         Hospital Course: 1. Sore throat. Likely from recent influenza infection completed course of Tamiflu. Completed 3 day course of fluconazole. Continued oral care during the course of hospital stay.  Reported improvement with magic mouth wash. Dr. Jearld Fenton with ENT evaluated the patient on 02/23/2011 and recommended humidifying patient's oxygen.  Patient's sore throat seems to be improved on on face tent.  On the day of discharge, patient's daughter was very insistent on having ENT reevaluate the patient.  Spoke with Dr. Annalee Genta , ENT covering for Dr. Jearld Fenton, who agreed with Dr. Jearld Fenton outlined plan and recommended the patient comes to the office for further evaluation if need be.  Will arrange for high flow oxygen for home use to help humidify the air.  2. Generalized weakness and deconditioning. Continue PT. HHPT at discharge.  Multiple discussions were done with the patient's daughter who declined SNF.   3. Respiratory failure, acute-on-chronic: Secondary to COPD exacerbation due to influenza and pneumonia. Continue steroid taper, completed course of Tamiflu. Completed course of Moxifloxacin. Antibiotics till 02/23/2011. Continue oxygen 2L (home is 3L) at discharge.   4. Atrial fibrillation. Continue digoxin and diltiazem. Coumadin per pharmacy. INR therapeutic.   5. Dysphagia. Continue thick liquids. Continue solid (regular) consistency as per speech therapy.   6. GERD: continue PPI.   7. MRSA: continue mupirocin and chlorhexidine cloths.   8. Constipation: Continue miralax  and sennokot. Resolved.   9. Urinary retention: Continue the patient on home bethanechol.   10. Upper respiratory viral infection/Influenza: Completed Tamiflu.  Day of Discharge BP 143/67  Pulse 62  Temp(Src) 97.8 F (36.6 C) (Oral)  Resp 18  Ht 5\' 2"  (1.575 m)  Wt 68.493 kg (151 lb)  BMI 27.62 kg/m2  SpO2 93%  Results for orders placed during the hospital encounter of 02/21/11 (from the past 48 hour(s))  PROTIME-INR     Status: Abnormal   Collection Time   02/23/11  3:30 AM      Component Value Range Comment   Prothrombin Time 25.9 (*) 11.6 - 15.2 (seconds)    INR 2.32 (*) 0.00 - 1.49    BASIC METABOLIC PANEL     Status: Abnormal  Collection Time   02/23/11  3:30 AM      Component Value Range Comment   Sodium 136  135 - 145 (mEq/L)    Potassium 3.3 (*) 3.5 - 5.1 (mEq/L)    Chloride 95 (*) 96 - 112 (mEq/L)    CO2 37 (*) 19 - 32 (mEq/L)    Glucose, Bld 135 (*) 70 - 99 (mg/dL)    BUN 18  6 - 23 (mg/dL)    Creatinine, Ser 1.61  0.50 - 1.10 (mg/dL)    Calcium 8.2 (*) 8.4 - 10.5 (mg/dL)    GFR calc non Af Amer 51 (*) >90 (mL/min)    GFR calc Af Amer 59 (*) >90 (mL/min)   MAGNESIUM     Status: Normal   Collection Time   02/23/11  3:30 AM      Component Value Range Comment   Magnesium 2.0  1.5 - 2.5 (mg/dL)   PROTIME-INR     Status: Abnormal   Collection Time   02/24/11  3:50 AM      Component Value Range Comment   Prothrombin Time 25.7 (*) 11.6 - 15.2 (seconds)    INR 2.30 (*) 0.00 - 1.49    BASIC METABOLIC PANEL     Status: Abnormal   Collection Time   02/24/11  3:50 AM      Component Value Range Comment   Sodium 137  135 - 145 (mEq/L)    Potassium 3.6  3.5 - 5.1 (mEq/L)    Chloride 95 (*) 96 - 112 (mEq/L)    CO2 38 (*) 19 - 32 (mEq/L)    Glucose, Bld 128 (*) 70 - 99 (mg/dL)    BUN 22  6 - 23 (mg/dL)    Creatinine, Ser 0.96 (*) 0.50 - 1.10 (mg/dL)    Calcium 8.6  8.4 - 10.5 (mg/dL)    GFR calc non Af Amer 40 (*) >90 (mL/min)    GFR calc Af Amer 47 (*) >90  (mL/min)     Dg Chest 2 View  02/21/2011  *RADIOLOGY REPORT*  Clinical Data: Cough and fever.  CHEST - 2 VIEW  Comparison: Chest radiograph performed 02/17/2011  Findings: The lungs are well-aerated.  Patchy bibasilar airspace opacities raise question for pneumonia, slightly more apparent than on the prior study.  There is no evidence of pleural effusion or pneumothorax.  The heart is borderline normal in size; a pacemaker is noted at the left chest wall, with a single lead ending at the right ventricle. No acute osseous abnormalities are seen.  IMPRESSION: Patchy bibasilar airspace opacities raise concern for pneumonia.  Original Report Authenticated By: Tonia Ghent, M.D.   Dg Chest 2 View  02/17/2011  *RADIOLOGY REPORT*  Clinical Data: Shortness of breath  CHEST - 2 VIEW  Comparison: 02/14/2011  Findings: Left subclavian transvenous pacemaker stable. Improvement in the pulmonary vascular congestion.  Patchy atelectasis or infiltrates in the lower lobes, left greater than right, slightly improved.  Heart size upper limits normal. Tortuous atheromatous thoracic aorta.  Regional bones unremarkable.  IMPRESSION:  1.  Improving vascular congestion and bibasilar atelectasis.  Original Report Authenticated By: Thora Lance III, M.D.   Dg Hip Complete Left  02/17/2011  *RADIOLOGY REPORT*  Clinical Data: Left hip pain, known known injury  LEFT HIP - COMPLETE 2+ VIEW  Comparison: None.  Findings: The bones are diffusely osteopenic.  There is a moderate lumbar scoliosis convex to the right with associated degenerative change.  Surgical clips are noted in  the right upper quadrant from prior cholecystectomy.  A moderate amount of feces is noted throughout the colon.  Both hips are in normal position with moderate degenerative joint disease for age.  No acute fracture is seen.  The pelvic rami are intact.  IMPRESSION:  1.  Osteopenia.  No acute hip fracture. 2.  Lumbar scoliosis with diffuse degenerative  change.  Original Report Authenticated By: Juline Patch, M.D.   Dg Chest Portable 1 View  02/14/2011  *RADIOLOGY REPORT*  Clinical Data: Shortness of breath, cough, fever and weakness, former smoker  PORTABLE CHEST - 1 VIEW  Comparison: Chest x-ray of 02/13/2011  Findings: There is cardiomegaly present with mild pulmonary vascular congestion.  No focal infiltrate is seen.  A single lead permanent pacemaker is present.  No acute bony abnormality is noted.  IMPRESSION: Cardiomegaly and mild pulmonary vascular congestion with basilar atelectasis.  Original Report Authenticated By: Juline Patch, M.D.   Disposition: Home with home health.  Diet: Heart healthy diet.  Activity: Resume as tolerated  Follow-up Appts: Discharge Orders    Future Orders Please Complete By Expires   Diet - low sodium heart healthy      Increase activity slowly      Discharge instructions      Comments:   Follow up with MCNEILL,WENDY, MD (PCP) in 1 week. Follow up with Dr. Jearld Fenton (ENT) in 1 week. Follow up with Pulmonary ([facility]) in 1 -2 weeks as needed. Please have HH RN draw PT/INR on 02/26/2011 and have the results sent to patient's cardiologist.      TESTS THAT NEED FOLLOW-UP None  Time spent on discharge, talking to the patient, and coordinating care: 50 mins.   Signed: Cristal Ford, MD 02/24/2011, 2:18 PM

## 2011-02-24 NOTE — Plan of Care (Signed)
Problem: Phase I Progression Outcomes Goal: Tolerating diet Outcome: Completed/Met Date Met:  02/24/11 Adequate for discharge

## 2011-02-24 NOTE — Progress Notes (Signed)
Subjective: Pt is comfortable with no increased wob.  Using face shield for dry throat per ENt recomm. afeb on abx.  Objective: Vital signs in last 24 hours: Blood pressure 160/65, pulse 69, temperature 97.9 F (36.6 C), temperature source Axillary, resp. rate 18, height 5\' 2"  (1.575 m), weight 68.493 kg (151 lb), SpO2 97.00%.  Intake/Output from previous day: 12/30 0701 - 12/31 0700 In: 143 [P.O.:140; I.V.:3] Out: 400 [Urine:400]   Physical Exam:   frail appearing female in nad, on face tent Chest with basilar crackles, no wheezing Cor with mild tachy  abd soft and nontender, bs+ Alert, oriented, moves all 4.    Lab Results:  Basename 02/22/11 0400  WBC 12.6*  HGB 10.9*  HCT 33.6*  PLT 247   BMET  Basename 02/24/11 0350 02/23/11 0330 02/22/11 0400  NA 137 136 138  K 3.6 3.3* 3.0*  CL 95* 95* 95*  CO2 38* 37* 37*  GLUCOSE 128* 135* 117*  BUN 22 18 19   CREATININE 1.12* 0.92 0.91  CALCIUM 8.6 8.2* 8.5    Studies/Results: No results found.  Assessment/Plan: Patient Active Hospital Problem List:  Pneumonia post influenza Pt seems to be doing well on current abx, with nothing to suggest staph superinfection.    C O P D (03/03/2007)  pt has no increased wob, and no wheezing on exam.  Would continue current BD -On pulmicort / brovana in the hospital - can resume advair & nebs prn on dc -wean prednisone over 2 weeks  Respiratory failure, acute-on-chronic (02/14/2011) -reassess O2 satn on dc  PCCM to sign off She will call for appt for Dr wert as needed [facility].  02/24/2011, 8:21 AM

## 2011-02-24 NOTE — Progress Notes (Signed)
ANTICOAGULATION CONSULT NOTE - Follow Up Consult  Pharmacy Consult for coumadin Indication: Atrial Fibrillation  Allergies  Allergen Reactions  . Sulfonamide Derivatives Hives  . Penicillins Itching and Rash    Patient Measurements: Height: 5\' 2"  (157.5 cm) Weight: 151 lb (68.493 kg) IBW/kg (Calculated) : 50.1    Vital Signs: Temp: 97.9 F (36.6 C) (12/31 0600) Temp src: Axillary (12/31 0600) BP: 160/65 mmHg (12/31 0600) Pulse Rate: 69  (12/31 0600)  Labs:  Basename 02/24/11 0350 02/23/11 0330 02/22/11 0400  HGB -- -- 10.9*  HCT -- -- 33.6*  PLT -- -- 247  APTT -- -- --  LABPROT 25.7* 25.9* 24.7*  INR 2.30* 2.32* 2.19*  HEPARINUNFRC -- -- --  CREATININE 1.12* 0.92 0.91  CKTOTAL -- -- --  CKMB -- -- --  TROPONINI -- -- --   Estimated Creatinine Clearance: 27.3 ml/min (by C-G formula based on Cr of 1.12).   Medications:  Scheduled:     . albuterol  2.5 mg Nebulization TID  . antiseptic oral rinse  15 mL Mouth Rinse q12n4p  . arformoterol  15 mcg Nebulization BID  . aspirin  81 mg Oral QPM  . beta carotene w/minerals  1 tablet Oral Daily  . budesonide  0.5 mg Nebulization BID  . calcium-vitamin D  1 tablet Oral BID  . chlorhexidine  15 mL Mouth Rinse BID  . cholecalciferol  2,000 Units Oral Daily  . digoxin  125 mcg Oral Q0700  . diltiazem  60 mg Oral Q6H  . furosemide  40 mg Oral Q0700  . guaiFENesin  10 mL Oral Q6H  . moxifloxacin  400 mg Oral q1800  . pantoprazole  80 mg Oral Q1200  . polyethylene glycol  17 g Oral Daily  . predniSONE  40 mg Oral Q breakfast  . senna-docusate  1 tablet Oral QHS  . sodium chloride  3 mL Intravenous Q12H  . warfarin  1 mg Oral ONCE-1800  . warfarin  1 mg Oral ONCE-1800  . DISCONTD: diltiazem  240 mg Oral Daily   Infusions:     . DISCONTD: sodium chloride 250 mL (02/22/11 0800)   PRN: acetaminophen, acetaminophen, albuterol, bethanechol, food thickener, guaiFENesin, HYDROcodone-acetaminophen, ipratropium,  LORazepam, magic mouthwash w/lidocaine, phenol, promethazine, DISCONTD: sodium chloride  Assessment:  77 yo F on chronic coumadin for A.Fib  INR therapeutic today, appears to be stabilizing. Unclear if dose was given 12/29 as it was not charted - 1mg  was ordered. Received 1mg  12/28 and 12/30.   Pt. was discharged from [facility] on 12/27 with INR 2.85. During this previous admission, pt received coumadin 2 mg x 2 doses and INR became supratherapeutic up to 5.52. No coumadin was given from 12/23 to discharge date. Pt was readmitted 12/28.  Home regimen prior to previous admission: 2 mg po daily.  Potential Drug interactions: Avelox 12/28-12/30; fluconazole 12/28-30  Goal of Therapy:  INR 2-3   Plan:  1.) Coumadin 1 mg po x 1 at 1800 2.) follow up AM INR  Gwen Her PharmD 11:22 AM 02/24/2011

## 2011-02-24 NOTE — Plan of Care (Signed)
Problem: Consults Goal: Respiratory Problems Patient Education See Patient Education Module for education specifics.  Outcome: Completed/Met Date Met:  02/24/11 Patient will be discharged home on tent humidifier.  Extensive time spent with daughter on the need for humidity for this particular patient.

## 2011-02-24 NOTE — Plan of Care (Signed)
Problem: Discharge Progression Outcomes Goal: Home O2 if indicated Outcome: Completed/Met Date Met:  02/24/11 [facility] notified and will resume home O2 services.

## 2011-02-24 NOTE — Progress Notes (Signed)
Occupational Therapy Evaluation Patient Details Name: Peggy Harvey MRN: [id number] DOB: January 06, 1914 Today's Date: 02/24/2011 1145 12 17 ev2  Problem List:  Patient Active Problem List  Diagnoses  . HYPERTENSION  . Campath-induced atrial fibrillation  . C O P D  . DYSPNEA  . Cough  . Respiratory failure, acute-on-chronic  . Flu-like symptoms  . Dysphagia  . Sore throat  . Weakness generalized  . Hypokalemia  . Pneumonia    Past Medical History:  Past Medical History  Diagnosis Date  . Femoral artery occlusion   . Coronary artery disease   . COPD (chronic obstructive pulmonary disease)   . Campath-induced atrial fibrillation   . Dysphagia     pills have to be crushed and given with applesauce  . Arthritis   . Degenerative arthritis of spine   . Pneumonia     hx aspiraton pna  . Bleeding nose Dec. 2012  . H/O hiatal hernia   . GERD (gastroesophageal reflux disease)   . DDD (degenerative disc disease)   . Osteoarthritis   . Peripheral vascular disease   . ESBL (extended spectrum beta-lactamase) producing bacteria infection   . Constipation   . Gangrene     of the bowels  . CHF (congestive heart failure)   . Hyperlipidemia   . Dysphagia    Past Surgical History:  Past Surgical History  Procedure Date  . Pacemaker insertion   . Abdominal hysterectomy   . Appendectomy   . Stomach surgery 1940's    stomach surgery due to gangrene  . Cholecystectomy   . Cataract extraction, bilateral     OT Assessment/Plan/Recommendation OT Assessment Clinical Impression Statement: this 77 yo female was admitted to hospital with sore throat; she was recently admitted to hosptial.  She was able to do most adls on her own (except showering) and daughter works during the day.  Daughter will stay home with pt; she is appropriate for skilled OT to restore independence with adls.  Pt has min guard to supervision goals in acute OT Recommendation/Assessment: Patient will need skilled  OT in the acute care venue OT Problem List: Decreased strength;Decreased activity tolerance;Impaired balance (sitting and/or standing);Cardiopulmonary status limiting activity OT Therapy Diagnosis : Generalized weakness OT Plan OT Frequency: Min 2X/week OT Treatment/Interventions: Self-care/ADL training;Therapeutic activities;Balance training;Patient/family education;Energy conservation OT Recommendation Follow Up Recommendations: Home health OT Equipment Recommended: None recommended by OT Individuals Consulted Consulted and Agree with Results and Recommendations: Patient;Family member/caregiver Family Member Consulted: daughter OT Goals Acute Rehab OT Goals OT Goal Formulation: With patient/family Time For Goal Achievement: 2 weeks ADL Goals Pt Will Perform Grooming: with supervision;Other (comment) (sit to stand at sink) ADL Goal: Grooming - Progress: Not met Pt Will Perform Lower Body Dressing: with min assist;Sit to stand from chair;Other (comment) (underwear only) ADL Goal: Lower Body Dressing - Progress: Not met Pt Will Transfer to Toilet: with min assist;Other (comment);with cueing (comment type and amount);Ambulation;3-in-1 (min guard A; min cues) ADL Goal: Toilet Transfer - Progress: Not met Pt Will Perform Toileting - Clothing Manipulation: with min assist;Other (comment);Standing (min guard) ADL Goal: Toileting - Clothing Manipulation - Progress: Not met Pt Will Perform Toileting - Hygiene: with min assist;Other (comment);Standing at 3-in-1/toilet (min guard) ADL Goal: Toileting - Hygiene - Progress: Not met Miscellaneous OT Goals Miscellaneous OT Goal #1: Pt will initiate rest breaks during adls as needed for energy conservation OT Goal: Miscellaneous Goal #1 - Progress: Not met  OT Evaluation Precautions/Restrictions  Precautions Precautions: Fall Restrictions Weight  Bearing Restrictions: No Prior Functioning Home Living Lives With: Daughter Receives Help From:  Family Type of Home: House Home Layout: One level Home Access: Stairs to enter Bathroom Shower/Tub: Tub/shower unit;Other (comment) (with tub bench) Bathroom Toilet: Standard (elevated toilet seat) Home Adaptive Equipment: Wheelchair - manual;Raised toilet seat with rails;Tub transfer bench (rollator) Additional Comments: home O2 Prior Function Level of Independence: Independent with basic ADLs;Other (comment) (lately daughter has been helping with shower) ADL ADL Eating/Feeding: Simulated;Set up;Supervision/safety;Other (comment) (dysphagia) Where Assessed - Eating/Feeding: Chair Grooming: Performed;Brushing hair;Set up Where Assessed - Grooming: Sitting, chair;Supported Upper Body Bathing: Simulated;Set up;Other (comment) (extra time) Where Assessed - Upper Body Bathing: Sitting, chair;Supported Lower Body Bathing: Simulated;Moderate assistance Where Assessed - Lower Body Bathing: Sit to stand from chair Upper Body Dressing: Simulated;Minimal assistance;Other (comment) Where Assessed - Upper Body Dressing: Sitting, chair;Supported Lower Body Dressing: Simulated;Maximal assistance Where Assessed - Lower Body Dressing: Sit to stand from chair Toilet Transfer: Performed;Minimal assistance Toilet Transfer Method: Stand pivot Toilet Transfer Equipment: Bedside commode Toileting - Clothing Manipulation: Performed;Minimal assistance;Other (comment) (decreased standing tolerance) Where Assessed - Toileting Clothing Manipulation: Standing Toileting - Hygiene: Performed;Moderate assistance Where Assessed - Toileting Hygiene: Standing Equipment Used: Rolling walker ADL Comments: daughter present; pt on humidified air mask.  fatiques easily Vision/Perception  Vision - History Patient Visual Report: No change from baseline Cognition Cognition Overall Cognitive Status: Appears within functional limits for tasks assessed Sensation/Coordination Coordination Gross Motor Movements are Fluid  and Coordinated: Yes Extremity Assessment RUE Assessment RUE Assessment: Within Functional Limits LUE Asessment:  Within Functional Limits Mobility  Transfers Sit to Stand: 4: Min assist;With armrests;From chair/3-in-1 Sit to Stand Details (indicate cue type and reason): min cues for hand placement Exercises   End of Session OT - End of Session Activity Tolerance: Patient limited by fatigue Patient left: in chair;with call bell in reach General Behavior During Session: [facility] for tasks performed Cognition: [facility] for tasks performed   Iana Buzan 02/24/2011, 1:25 PM [phone]

## 2011-02-25 NOTE — Progress Notes (Signed)
02-25-11 Refaxed HHC orders, demographics and dc summary to [facility] as patient's dtr called to nursing unit stating [facility] agency has not contacted patient yet. HHC was arranged on 02-24-11 prior to dc per CM notes in [place]. Will refax information to [phone] and alert agency to contact patient as patient is supposed to have pt/inr drawn. Spoke with Herbert Seta, triage nurse for [facility] today 1-1-3 regarding this. Faxed confirmation rec'd.   [place], [place] [phone]

## 2011-02-27 ENCOUNTER — Other Ambulatory Visit: Payer: Self-pay | Admitting: Otolaryngology

## 2011-02-27 DIAGNOSIS — J029 Acute pharyngitis, unspecified: Secondary | ICD-10-CM

## 2011-02-28 ENCOUNTER — Telehealth: Payer: Self-pay | Admitting: Internal Medicine

## 2011-02-28 DIAGNOSIS — J449 Chronic obstructive pulmonary disease, unspecified: Secondary | ICD-10-CM

## 2011-02-28 MED ORDER — ARFORMOTEROL TARTRATE 15 MCG/2ML IN NEBU: 15.0000 ug | INHALATION_SOLUTION | Freq: Two times a day (BID) | RESPIRATORY_TRACT | Status: DC

## 2011-02-28 NOTE — Telephone Encounter (Signed)
Pt's daughter called again says she has more questions about pt's meds can be reached at [phone] and [phone].[name]

## 2011-02-28 NOTE — Telephone Encounter (Signed)
LMTCB on home and cell number.Jennifer Castillo, CMA  

## 2011-02-28 NOTE — Telephone Encounter (Signed)
Pt's daughter stated that pt has a hard, thick, crusty substance on back of throat.  Pt is using a "facial tent".  Pt's daughter would like to know if this is okay to use.  Please advise.  Peggy Harvey

## 2011-02-28 NOTE — Telephone Encounter (Signed)
I spoke with the pt daughter and she states they were prescribed brovana twice daily at time of discharge, but the pharmacy is stating it needs a prior [place]. I advised the pt daughter that we should send it to a home care company since the pt has medicare. They use [facility] for other meds, but Brovana cannot be sent to there. Pt daughter aware we will send order to another company.  Another issue she had was that the pt is having a sore throat and the ENT, Dr, Jearld Fenton prescribed her a full face mask with humidified oxygen to help with this and the daughter wanted to make Dr. Sherene Sires aware.   Also the pt had some other med changes. She was advised to stop advair and take Brovana bid, pulmicort 0.5 bid, albuterol and ipratropium every 6 hours as needed.  The pt has an appt with TP on 03-10-11 at 4:15 with TP for HFU. The daughter requested the latest appt available.  Order has been sent to have brovana sent to patient. Carron Curie, CMA

## 2011-03-03 ENCOUNTER — Telehealth: Payer: Self-pay | Admitting: Internal Medicine

## 2011-03-03 MED ORDER — ARFORMOTEROL TARTRATE 15 MCG/2ML IN NEBU: 15.0000 ug | INHALATION_SOLUTION | Freq: Two times a day (BID) | RESPIRATORY_TRACT | Status: AC

## 2011-03-03 NOTE — Telephone Encounter (Signed)
Per pt's insurance this medication should be filed under MCR Part B. A new prescription has been sent to the pharmacy with diagnosis code and statement of how to file.

## 2011-03-04 ENCOUNTER — Telehealth: Payer: Self-pay | Admitting: Internal Medicine

## 2011-03-04 NOTE — Telephone Encounter (Signed)
Daughter aware that the Brovana and Pulmicort should be used twice a day every day and the albuterol and ipratropium can be used 4 times daily only if needed. Daughter verbalized understanding.

## 2011-03-10 ENCOUNTER — Inpatient Hospital Stay: Payer: Medicare Other | Admitting: Adult Health

## 2011-03-12 ENCOUNTER — Other Ambulatory Visit: Payer: Self-pay | Admitting: Internal Medicine

## 2011-03-16 ENCOUNTER — Other Ambulatory Visit: Payer: Self-pay | Admitting: Internal Medicine

## 2011-03-20 ENCOUNTER — Inpatient Hospital Stay: Payer: Medicare Other | Admitting: Adult Health

## 2011-04-04 ENCOUNTER — Inpatient Hospital Stay: Payer: Medicare Other | Admitting: Internal Medicine

## 2011-04-11 ENCOUNTER — Telehealth: Payer: Self-pay | Admitting: Internal Medicine

## 2011-04-11 NOTE — Telephone Encounter (Signed)
Spoke with [place] and advised we have not received. He had the wrong fax number for [place]. I gave him number to fax this to triage and will place in MW look at to be signed.

## 2011-04-11 NOTE — Telephone Encounter (Signed)
lmomtcb for Peggy Harvey

## 2011-04-11 NOTE — Telephone Encounter (Signed)
lmomtcb x1 for [place]

## 2011-04-11 NOTE — Telephone Encounter (Signed)
[name] returning call can be reached at [phone].[name]

## 2011-04-11 NOTE — Telephone Encounter (Signed)
Peggy Harvey returned triage's call.  Antionette Fairy

## 2011-04-16 ENCOUNTER — Inpatient Hospital Stay: Payer: Medicare Other | Admitting: Internal Medicine

## 2011-05-05 ENCOUNTER — Encounter: Payer: Self-pay | Admitting: Internal Medicine

## 2011-05-05 ENCOUNTER — Ambulatory Visit (INDEPENDENT_AMBULATORY_CARE_PROVIDER_SITE_OTHER)
Admission: RE | Admit: 2011-05-05 | Discharge: 2011-05-05 | Disposition: A | Discharge status: Home / Self Care | Payer: Medicare Other | Source: Ambulatory Visit | Attending: Internal Medicine | Admitting: Internal Medicine

## 2011-05-05 ENCOUNTER — Ambulatory Visit (INDEPENDENT_AMBULATORY_CARE_PROVIDER_SITE_OTHER): Payer: Medicare Other | Admitting: Internal Medicine

## 2011-05-05 VITALS — BP 116/62 | HR 60 | Temp 97.7°F | Ht 63.0 in | Wt 136.0 lb

## 2011-05-05 DIAGNOSIS — I509 Heart failure, unspecified: Secondary | ICD-10-CM

## 2011-05-05 DIAGNOSIS — J90 Pleural effusion, not elsewhere classified: Secondary | ICD-10-CM

## 2011-05-05 DIAGNOSIS — J449 Chronic obstructive pulmonary disease, unspecified: Secondary | ICD-10-CM

## 2011-05-05 DIAGNOSIS — J9620 Acute and chronic respiratory failure, unspecified whether with hypoxia or hypercapnia: Secondary | ICD-10-CM

## 2011-05-05 MED ORDER — FUROSEMIDE 20 MG PO TABS: 20.0000 mg | ORAL_TABLET | Freq: Every day | ORAL | Status: DC

## 2011-05-05 NOTE — Assessment & Plan Note (Signed)
Adequate control on present rx, reviewed rx with daughter also to continue brovana and budesonide nebs

## 2011-05-05 NOTE — Patient Instructions (Addendum)
Ok to use 1lpm at rest, 2lpm when you leave home, but always use 3lpm when you are exerting and noticing that you are short of breath.  Please remember to go to the  x-ray department downstairs for your tests - we will call you with the results when they are available.  Please schedule a follow up visit in 3 months but call sooner if needed      Late add: Discussed with daughter by phone> increase R effusion so increase Lasix to 80 mg daily until seen on 3/15 and f/u with cxr here if breathing worsens - if not can just check cxr at next ov in 3 months because would treat asympt effusion conservatively anyway in this setting and daughter agrees to this approach

## 2011-05-05 NOTE — Assessment & Plan Note (Addendum)
DDX for now R effusion occurred in setting of mod/severe LAE by most recent echo 01/2011 represents chf until proven otherwise though could also be late parapneumonic. It is causing min symptoms so try increase lasix to 80  mg daily and recheck cxr at next ov, sooner if symptoms of sob worsen

## 2011-05-05 NOTE — Assessment & Plan Note (Addendum)
-   05/05/2011 Desat on 2lpm waling 185 ft > resolved on 3lpm with sats ok at rest on 1 lpm  Reviewed optimal 02 titration - due to apparent chf needs to maintain adequate sats with activity but 1lpm at rest appears to be adequate

## 2011-05-05 NOTE — Progress Notes (Signed)
Subjective:     Patient ID: Peggy Harvey, female   DOB: April 30, 1913  MRN: [id number]  HPI  89 yowf quit smoking in 1978 on 02 since Nov 2008 baseline rolling walker on 02 3 pulse x around the house most of care is thru [facility] at [place] in [place]    .   Date of Admission: 02/21/2011  Date of Discharge: 02/24/2011  Dr. Sherene Sires, Dr. Vassie Loll - Pulmonary  Dr. Jearld Fenton - ENT  Discharge Diagnoses:  Principal Problem:  *Sore throat   HYPERTENSION  Campath-induced atrial fibrillation  C O P D  DYSPNEA  Cough  Respiratory failure, acute-on-chronic  Flu-like symptoms  Dysphagia  Weakness generalized  Hypokalemia  Pneumonia   05/05/2011 post hosp f/u ov/Umaiza Matusik 2lpm continuous with doe  across parking lot = basline, no purulent sputum but still rattles mostly in am's able to bring up mucus with use of flutter valve but it's all white. Overall doing well.    Sleeping ok without nocturnal  or early am exacerbation  of sob or need for nocturnal resp rx x 02 at 3lpm. Also denies any obvious fluctuation of symptoms with weather or environmental changes or other aggravating or alleviating factors except as outlined above   ROS  At present neg for  any significant sore throat, dysphagia, itching, sneezing,  nasal congestion or excess/ purulent secretions,  fever, chills, sweats, unintended wt loss, pleuritic or exertional cp, hempoptysis, orthopnea pnd   Also denies presyncope, palpitations, heartburn, abdominal pain, nausea, vomiting, diarrhea  or change in bowel or urinary habits, dysuria,hematuria,  rash, arthralgias, visual complaints, headache, numbness weakness or ataxia.       Current Allergies   ! PCN  ! SULFA   Past Medical History:  Atrial Fibrillation  C O P D  - No PFT's in system April 28, 2008  Hypertension  Cholecystectomy  hysterectomy  appendix  degen disk disease  pneumonia- pvax in past   Past Surgical History:  gallbladder removal  hysterectomy  appendectomy    pacemaker 6/09   Family History:  Coronary Heart Disease father, mother, sister  Brother had Hodgkins  Sister breast cancer  Brother lung ca   Social History:  Patient states former smoker, quit 1978  widowed  lives with daughter  Pack-years: 1/2 pack daily 50 years      Review of Systems     Objective:   Physical Exam A/Ox3; pleasant & cooperative.NAD, wheelchair, oxygen wt 152 > 146 04/27/2008  HEENT: [place]/AT, EOM-wnl, PERRLA, EACs-clear, TMs-wnl, NOSE-clear, THROAT-clear & wnl.Dentures  NECK: Supple w/ fair ROM; no JVD; normal carotid impulses w/o bruits; no thyromegaly or nodules palpated; no lymphadenopathy.  CHEST: Rattling cough, decreased breath sounds esp R with dullness HEART: RRR, no m/r/g heard, paced  Pos edema, sym ABDOMEN: Soft & nt; nml bowel sounds; no organomegaly or masses detected.  EXT: Warm bilat, no calf pain,, clubbing, pulses intact Skin: no rash/lesion   CXR  05/05/2011 :  1. Interval moderate-sized right pleural effusion. 2. Increased bibasilar atelectasis or pneumonia.      Assessment:          Plan:

## 2011-05-06 NOTE — Assessment & Plan Note (Signed)
-  Echo 02/15/11  Left ventricle: The cavity size was normal. Wall thickness was increased in a pattern of moderate LVH. Systolic function was normal. The estimated ejection fraction was in the range of 55% to 60%. Wall motion was normal; there were no regional wall motion abnormalities. - Ventricular septum: Septal motion showed abnormal function and dyssynergy. These changes are consistent with intraventricular conduction delay. - Mitral valve: Moderate regurgitation. - Left atrium: The atrium was moderately to severely dilated. - Pulmonary arteries: Systolic pressure was moderately Increased.  Echo reviewed - note mod MR and LA mod / severely enlarged so this is the most likely cause of her R effusion  Increase furosemide to 80 mg daily with f/u w/in a week for renal function already planned

## 2011-05-06 NOTE — Progress Notes (Signed)
Quick Note:  Spoke with pt and notified of results per Dr. Wert. Pt verbalized understanding and denied any questions.  ______ 

## 2011-05-16 ENCOUNTER — Encounter: Payer: Self-pay | Admitting: *Deleted

## 2011-05-16 ENCOUNTER — Telehealth: Payer: Self-pay | Admitting: Internal Medicine

## 2011-05-16 NOTE — Telephone Encounter (Signed)
Called and spoke with Engineer, site at [place].  She states pt's acct is being audited by Medicare and they are needing OV notes.  Informed Wynona Canes this goes through our Medical Records Dept.  She asked if we received the fax they sent over several days ago regarding this request.  I was unsure if we did or not so therefore requested she refax this to the triage fax # so we can give the request to Medical Records for them to process.  Awaiting fax.

## 2011-05-19 ENCOUNTER — Telehealth: Payer: Self-pay | Admitting: Internal Medicine

## 2011-05-19 NOTE — Telephone Encounter (Signed)
Verlon Au, I believe there is a form in MW look at on this pt. It was on the fax in triage this morning and was placed in his cabinet.

## 2011-05-19 NOTE — Telephone Encounter (Signed)
Error.  Duplicate message.  Peggy Harvey °- °

## 2011-05-19 NOTE — Telephone Encounter (Signed)
Linus Salmons stated that a release is not required due to this being a Medicare audit.  Would also like to know if we did receive the request.  Please advise Debra at [phone], ext [phone].  Antionette Fairy

## 2011-05-19 NOTE — Telephone Encounter (Signed)
Spoke with Stanton Kidney at [place] and she is not sure if records release for was sent or not.  She is checking on this and will get back with [place].

## 2011-05-20 NOTE — Telephone Encounter (Signed)
Forms sent to Medical Records to take care of.

## 2011-05-26 ENCOUNTER — Telehealth: Payer: Self-pay | Admitting: Internal Medicine

## 2011-05-26 MED ORDER — BUDESONIDE 0.5 MG/2ML IN SUSP: RESPIRATORY_TRACT | Status: DC

## 2011-05-26 NOTE — Telephone Encounter (Signed)
Spoke with pt's daughter. She states would like to have rx for pulmicort sent to [facility] in [place] since [facility], [facility] will no pay for med. I have printed and faxed rx for [facility] in [place]. Nothing further needed.

## 2011-05-28 ENCOUNTER — Telehealth: Payer: Self-pay | Admitting: Internal Medicine

## 2011-05-28 NOTE — Telephone Encounter (Signed)
Pt is completely out of her Budesonide and has not had any of this medication since Mon., 05/26/11. Pt's daughter will come by to pick up samples of the Budesonide 1mg  vials and have the pt only use 1/2 of the vial with each use. [facility] should have the pt's medication to her by Thurs., 05/29/11. Pt's daughter verbalized understanding and will come by to pick up the sample.

## 2011-07-09 ENCOUNTER — Telehealth: Payer: Self-pay | Admitting: Internal Medicine

## 2011-07-09 MED ORDER — IPRATROPIUM BROMIDE 0.02 % IN SOLN: RESPIRATORY_TRACT | Status: DC

## 2011-07-09 NOTE — Telephone Encounter (Signed)
Called spoke with Steward Drone, verified that pt is needing refills on her atrovent neb soln.  Pt last seen 3.11.13 by MW.  Med last filled 1.20.13 #163mL with 0 refills.  Med refilled with 5 additional refills to verified pharmacy.  Steward Drone is aware and will call if anything further is needed.  Will sign off.

## 2011-07-10 ENCOUNTER — Telehealth: Payer: Self-pay | Admitting: Internal Medicine

## 2011-07-10 NOTE — Telephone Encounter (Signed)
PA for ipratropium has been approved and was run through medicare part b---and they had to say the pt got meds through another pharmacy---[facility] in [place] for the budesonide.

## 2011-10-25 ENCOUNTER — Other Ambulatory Visit: Payer: Self-pay | Admitting: Internal Medicine

## 2011-10-28 ENCOUNTER — Telehealth: Payer: Self-pay | Admitting: Internal Medicine

## 2011-10-28 ENCOUNTER — Other Ambulatory Visit: Payer: Self-pay | Admitting: Internal Medicine

## 2011-10-28 MED ORDER — BUDESONIDE 0.5 MG/2ML IN SUSP: RESPIRATORY_TRACT | Status: DC

## 2011-10-28 NOTE — Telephone Encounter (Signed)
i have resent rx over to them

## 2011-10-28 NOTE — Telephone Encounter (Signed)
[facility]. Pt daughter aware that pt needs to f/u within 1 year. Carron Curie, CMA

## 2011-12-23 ENCOUNTER — Telehealth: Payer: Self-pay | Admitting: Internal Medicine

## 2011-12-23 MED ORDER — BUDESONIDE 0.5 MG/2ML IN SUSP: RESPIRATORY_TRACT | Status: DC

## 2011-12-23 NOTE — Telephone Encounter (Signed)
Pt daughter aware that we had been refilling her prescription but it was being sent to [facility] instead of [facility]. I have sent the prescription to [facility] for them, pt daughter aware.

## 2011-12-25 ENCOUNTER — Other Ambulatory Visit: Payer: Self-pay | Admitting: *Deleted

## 2011-12-25 MED ORDER — BUDESONIDE 0.5 MG/2ML IN SUSP: RESPIRATORY_TRACT | Status: DC

## 2011-12-26 ENCOUNTER — Emergency Department (HOSPITAL_BASED_OUTPATIENT_CLINIC_OR_DEPARTMENT_OTHER)
Admission: EM | Admit: 2011-12-26 | Discharge: 2011-12-26 | Disposition: A | Discharge status: Home / Self Care | Payer: Medicare Other | Attending: Emergency Medicine | Admitting: Emergency Medicine

## 2011-12-26 ENCOUNTER — Encounter (HOSPITAL_BASED_OUTPATIENT_CLINIC_OR_DEPARTMENT_OTHER): Payer: Self-pay

## 2011-12-26 ENCOUNTER — Emergency Department (HOSPITAL_BASED_OUTPATIENT_CLINIC_OR_DEPARTMENT_OTHER): Payer: Medicare Other

## 2011-12-26 DIAGNOSIS — I10 Essential (primary) hypertension: Secondary | ICD-10-CM | POA: Insufficient documentation

## 2011-12-26 DIAGNOSIS — I2510 Atherosclerotic heart disease of native coronary artery without angina pectoris: Secondary | ICD-10-CM | POA: Insufficient documentation

## 2011-12-26 DIAGNOSIS — K5900 Constipation, unspecified: Secondary | ICD-10-CM | POA: Insufficient documentation

## 2011-12-26 DIAGNOSIS — Z8739 Personal history of other diseases of the musculoskeletal system and connective tissue: Secondary | ICD-10-CM | POA: Insufficient documentation

## 2011-12-26 DIAGNOSIS — Z8679 Personal history of other diseases of the circulatory system: Secondary | ICD-10-CM | POA: Insufficient documentation

## 2011-12-26 DIAGNOSIS — Z9981 Dependence on supplemental oxygen: Secondary | ICD-10-CM | POA: Insufficient documentation

## 2011-12-26 DIAGNOSIS — IMO0002 Reserved for concepts with insufficient information to code with codable children: Secondary | ICD-10-CM | POA: Insufficient documentation

## 2011-12-26 DIAGNOSIS — K219 Gastro-esophageal reflux disease without esophagitis: Secondary | ICD-10-CM | POA: Insufficient documentation

## 2011-12-26 DIAGNOSIS — Z9089 Acquired absence of other organs: Secondary | ICD-10-CM | POA: Insufficient documentation

## 2011-12-26 DIAGNOSIS — R21 Rash and other nonspecific skin eruption: Secondary | ICD-10-CM | POA: Insufficient documentation

## 2011-12-26 DIAGNOSIS — J4489 Other specified chronic obstructive pulmonary disease: Secondary | ICD-10-CM | POA: Insufficient documentation

## 2011-12-26 DIAGNOSIS — Z7982 Long term (current) use of aspirin: Secondary | ICD-10-CM | POA: Insufficient documentation

## 2011-12-26 DIAGNOSIS — Z8719 Personal history of other diseases of the digestive system: Secondary | ICD-10-CM | POA: Insufficient documentation

## 2011-12-26 DIAGNOSIS — R109 Unspecified abdominal pain: Secondary | ICD-10-CM | POA: Insufficient documentation

## 2011-12-26 DIAGNOSIS — J449 Chronic obstructive pulmonary disease, unspecified: Secondary | ICD-10-CM | POA: Insufficient documentation

## 2011-12-26 DIAGNOSIS — I4891 Unspecified atrial fibrillation: Secondary | ICD-10-CM | POA: Insufficient documentation

## 2011-12-26 DIAGNOSIS — J189 Pneumonia, unspecified organism: Secondary | ICD-10-CM | POA: Insufficient documentation

## 2011-12-26 DIAGNOSIS — N390 Urinary tract infection, site not specified: Secondary | ICD-10-CM | POA: Insufficient documentation

## 2011-12-26 DIAGNOSIS — Z90710 Acquired absence of both cervix and uterus: Secondary | ICD-10-CM | POA: Insufficient documentation

## 2011-12-26 DIAGNOSIS — I509 Heart failure, unspecified: Secondary | ICD-10-CM | POA: Insufficient documentation

## 2011-12-26 DIAGNOSIS — Z79899 Other long term (current) drug therapy: Secondary | ICD-10-CM | POA: Insufficient documentation

## 2011-12-26 DIAGNOSIS — Z7901 Long term (current) use of anticoagulants: Secondary | ICD-10-CM | POA: Insufficient documentation

## 2011-12-26 DIAGNOSIS — E785 Hyperlipidemia, unspecified: Secondary | ICD-10-CM | POA: Insufficient documentation

## 2011-12-26 LAB — CBC WITH DIFFERENTIAL/PLATELET
Basophils Relative: 0 % (ref 0–1)
Eosinophils Absolute: 0.1 10*3/uL (ref 0.0–0.7)
Eosinophils Relative: 2 % (ref 0–5)
Hemoglobin: 11.2 g/dL — ABNORMAL LOW (ref 12.0–15.0)
Lymphs Abs: 1.1 10*3/uL (ref 0.7–4.0)
MCH: 29.8 pg (ref 26.0–34.0)
MCHC: 33.6 g/dL (ref 30.0–36.0)
MCV: 88.6 fL (ref 78.0–100.0)
Monocytes Relative: 12 % (ref 3–12)
Platelets: 187 10*3/uL (ref 150–400)
RBC: 3.76 MIL/uL — ABNORMAL LOW (ref 3.87–5.11)

## 2011-12-26 LAB — COMPREHENSIVE METABOLIC PANEL
Albumin: 3.4 g/dL — ABNORMAL LOW (ref 3.5–5.2)
BUN: 19 mg/dL (ref 6–23)
Calcium: 9.1 mg/dL (ref 8.4–10.5)
GFR calc Af Amer: 53 mL/min — ABNORMAL LOW (ref >90)
Glucose, Bld: 136 mg/dL — ABNORMAL HIGH (ref 70–99)
Total Protein: 6.7 g/dL (ref 6.0–8.3)

## 2011-12-26 LAB — URINALYSIS, ROUTINE W REFLEX MICROSCOPIC
Nitrite: POSITIVE — AB
Specific Gravity, Urine: 1.01 (ref 1.005–1.030)
pH: 6 (ref 5.0–8.0)

## 2011-12-26 LAB — PROTIME-INR
INR: 1.57 — ABNORMAL HIGH (ref 0.00–1.49)
Prothrombin Time: 18.3 seconds — ABNORMAL HIGH (ref 11.6–15.2)

## 2011-12-26 LAB — APTT: aPTT: 36 seconds (ref 24–37)

## 2011-12-26 LAB — TROPONIN I: Troponin I: <0.3 ng/mL (ref <0.30)

## 2011-12-26 LAB — URINE MICROSCOPIC-ADD ON

## 2011-12-26 LAB — LACTIC ACID, PLASMA: Lactic Acid, Venous: 1.5 mmol/L (ref 0.5–2.2)

## 2011-12-26 MED ORDER — ACETAMINOPHEN-CODEINE #3 300-30 MG PO TABS: 1.0000 | ORAL_TABLET | Freq: Four times a day (QID) | ORAL | Status: DC | PRN

## 2011-12-26 MED ORDER — DEXTROSE 5 % IV SOLN
1.0000 g | Freq: Once | INTRAVENOUS | Status: AC
  Administered 2011-12-26: 1 g via INTRAVENOUS
  Filled 2011-12-26: qty 10

## 2011-12-26 MED ORDER — MORPHINE SULFATE 4 MG/ML IJ SOLN
4.0000 mg | INTRAMUSCULAR | Status: DC | PRN
  Administered 2011-12-26: 4 mg via INTRAVENOUS
  Filled 2011-12-26: qty 1

## 2011-12-26 MED ORDER — IOHEXOL 300 MG/ML  SOLN
50.0000 mL | Freq: Once | INTRAMUSCULAR | Status: AC | PRN
  Administered 2011-12-26: 50 mL via ORAL

## 2011-12-26 MED ORDER — CEPHALEXIN 500 MG PO CAPS: 500.0000 mg | ORAL_CAPSULE | Freq: Two times a day (BID) | ORAL | Status: DC

## 2011-12-26 MED ORDER — AZITHROMYCIN 250 MG PO TABS: 250.0000 mg | ORAL_TABLET | Freq: Every day | ORAL | Status: DC

## 2011-12-26 MED ORDER — PHENAZOPYRIDINE HCL 200 MG PO TABS: 200.0000 mg | ORAL_TABLET | Freq: Three times a day (TID) | ORAL | Status: DC

## 2011-12-26 MED ORDER — PHENAZOPYRIDINE HCL 100 MG PO TABS
95.0000 mg | ORAL_TABLET | Freq: Once | ORAL | Status: AC
  Administered 2011-12-26: 100 mg via ORAL
  Filled 2011-12-26: qty 1

## 2011-12-26 NOTE — ED Provider Notes (Addendum)
History     CSN: [id number]  Arrival date & time 12/26/11  1554   First MD Initiated Contact with Patient 12/26/11 1705      Chief Complaint  Patient presents with  . Abdominal Pain  . Urinary Tract Infection    (Consider location/radiation/quality/duration/timing/severity/associated sxs/prior treatment) HPI Comments: PMHx of  Atrial Fibrillation on coumadin [facility] O P D   Hypertension   Cholecystectomy   hysterectomy   appendix   degen disk disease    Pt comes in with cc of abd pain. Pt reports that she started having some abd pain, right sided on Monday. The pain is severe when she has it, is intermittent with no precipitating, aggravating or relieving factors and she has no associated n/v/f/c/diarrhea. The pain is "deep, severe." Pt has no UTI like sx. No hx of renal stones. Pt also has COPD, uses home O2 all the time. + cough - not new.  During exam, there is a rash noted in the right abdomen, erythematous, no pain with the rash or itching.    Patient is a 78 y.o. female presenting with abdominal pain and urinary tract infection. The history is provided by the patient.  Abdominal Pain The primary symptoms of the illness include abdominal pain. The primary symptoms of the illness do not include shortness of breath, nausea, vomiting or dysuria.  Symptoms associated with the illness do not include frequency.  Urinary Tract Infection Associated symptoms include abdominal pain. Pertinent negatives include no chest pain, no headaches and no shortness of breath.    Past Medical History  Diagnosis Date  . Femoral artery occlusion   . Coronary artery disease   . COPD (chronic obstructive pulmonary disease)   . Atrial fibrillation   . Dysphagia     pills have to be crushed and given with applesauce  . Arthritis   . Degenerative arthritis of spine   . Pneumonia     hx aspiraton pna  . Bleeding nose Dec. 2012  . H/O hiatal hernia   . GERD (gastroesophageal reflux disease)     . DDD (degenerative disc disease)   . Osteoarthritis   . Peripheral vascular disease   . ESBL (extended spectrum beta-lactamase) producing bacteria infection   . Constipation   . Gangrene     of the bowels  . CHF (congestive heart failure)   . Hyperlipidemia   . Dysphagia     Past Surgical History  Procedure Date  . Pacemaker insertion   . Abdominal hysterectomy   . Appendectomy   . Stomach surgery 1940's    stomach surgery due to gangrene  . Cholecystectomy   . Cataract extraction, bilateral     No family history on file.  History  Substance Use Topics  . Smoking status: Former Smoker -- 0.5 packs/day for 50 years    Types: Cigarettes    Quit date: 02/13/1989  . Smokeless tobacco: Never Used  . Alcohol Use: No    OB History    Grav Para Term Preterm Abortions TAB SAB Ect Mult Living                  Review of Systems  Constitutional: Negative for activity change.  HENT: Negative for neck pain.   Respiratory: Negative for shortness of breath.   Cardiovascular: Negative for chest pain.  Gastrointestinal: Positive for abdominal pain. Negative for nausea and vomiting.  Genitourinary: Positive for flank pain. Negative for dysuria and frequency.  Neurological: Negative for weakness and headaches.  Allergies  Sulfonamide derivatives and Penicillins  Home Medications   Current Outpatient Rx  Name Route Sig Dispense Refill  . ACETAMINOPHEN 500 MG PO TABS Oral Take 500 mg by mouth every 6 (six) hours as needed.    . ACETYLCYSTEINE IN Inhalation Inhale 3 mLs into the lungs daily.    . ALBUTEROL SULFATE (5 MG/ML) 0.5% IN NEBU Nebulization Take 0.5 mLs (2.5 mg total) by nebulization every 6 (six) hours as needed for wheezing or shortness of breath. 20 mL 0  . ARFORMOTEROL TARTRATE 15 MCG/2ML IN NEBU Nebulization Take 2 mLs (15 mcg total) by nebulization 2 (two) times daily. DX:  496, FILE WITH MCR PART B 120 mL 6  . VITAMIN C 1000 MG PO TABS Oral Take 4,000 mg by  mouth daily.      . ASPIRIN 81 MG PO TABS Oral Take 81 mg by mouth every evening.     [name] Kitchen BETHANECHOL CHLORIDE 10 MG PO TABS Oral Take 10 mg by mouth 3 (three) times daily.      . BUDESONIDE 0.5 MG/2ML IN SUSP  USE 1 VIAL IN NEBULIZER TWICE A DAY 120 mL 5    MUST HAVE DIAGNOSIS CODE Dx code 496  . BUDESONIDE 0.5 MG/2ML IN SUSP  1 vial in nebulizer twice daily Dx 496 120 mL 5    Dx 496  . CALCIUM CARBONATE-VITAMIN D 500-500 MG-UNIT PO CHEW Oral Chew 1 tablet by mouth 2 (two) times daily.      [name] Kitchen VITAMIN D 2000 UNITS PO TABS Oral Take 2,000 Units by mouth daily.      [name] Kitchen CLONIDINE HCL 0.1 MG/24HR TD PTWK Transdermal Place 1 patch onto the skin once a week.    [name] Kitchen CRANBERRY EXTRACT PO Oral Take 1 tablet by mouth 2 (two) times daily.      [name] Kitchen DIGOXIN 0.25 MG PO TABS Oral Take 0.125 mcg by mouth every morning. Pt takes (1/2 tab) for 0.159mcg dose    . DILTIAZEM HCL ER COATED BEADS 240 MG PO CP24 Oral Take 240 mg by mouth daily.    [name] Kitchen ESOMEPRAZOLE MAGNESIUM 40 MG PO CPDR Oral Take 40 mg by mouth daily before breakfast.      . STARCH (THICKENING) PO POWD  With water/liquids. 1 Can 0  . FUROSEMIDE 20 MG PO TABS Oral Take 1 tablet (20 mg total) by mouth daily. 30 tablet 11  . FUROSEMIDE 40 MG PO TABS Oral Take 40 mg by mouth every morning.     . GUAIFENESIN ER 600 MG PO TB12 Oral Take 600 mg by mouth 2 (two) times daily.      . GUAIFENESIN 100 MG/5ML PO SOLN Oral Take 5 mLs by mouth every 4 (four) hours as needed. cough     . HYDROCODONE-ACETAMINOPHEN 5-325 MG PO TABS Oral Take 1 tablet by mouth every 6 (six) hours as needed. pain     . IPRATROPIUM BROMIDE 0.02 % IN SOLN  USE 1 NEBULE EVERY 6 HOURS AS NEEDED FOR WHEEZING 120 mL 5    Dx 496  . IRON PO Oral Take 1 tablet by mouth daily.    [name] Kitchen LEVALBUTEROL TARTRATE 45 MCG/ACT IN AERO Inhalation Inhale 1-2 puffs into the lungs every 4 (four) hours as needed.    [name] Kitchen LORAZEPAM 0.5 MG PO TABS Oral Take 0.25 mg by mouth daily.    [name] Kitchen MACULAR VITAMIN BENEFIT PO Oral Take 2  tablets by mouth.      . ESTROVEN PO Oral Take 1  tablet by mouth daily.      [name] Kitchen FISH OIL 1000 MG PO CAPS Oral Take 1 capsule by mouth 2 (two) times daily.    [name] Kitchen POTASSIMIN PO  1.5 tsp daily    . PROCHLORPERAZINE MALEATE 5 MG PO TABS Oral Take 5 mg by mouth every 6 (six) hours as needed.    . WARFARIN SODIUM 1 MG PO TABS  Take as directed      BP 122/50  Pulse 66  Temp 98.3 F (36.8 C) (Oral)  Resp 20  Ht 5\' 2"  (1.575 m)  Wt 126 lb (57.153 kg)  BMI 23.05 kg/m2  SpO2 98%  Physical Exam  Nursing note and vitals reviewed. Constitutional: She is oriented to person, place, and time. She appears well-developed.  HENT:  Head: Normocephalic and atraumatic.  Eyes: Conjunctivae normal and EOM are normal. Pupils are equal, round, and reactive to light.  Neck: Normal range of motion. Neck supple.  Cardiovascular: Normal rate, regular rhythm and normal heart sounds.   Pulmonary/Chest: Effort normal and breath sounds normal. No respiratory distress.  Abdominal: Soft. Bowel sounds are normal. She exhibits no distension. There is tenderness. There is no rebound and no guarding.       Pt had lower quadrant tenderness bilaterally, and there is a reducible hernia. Pt has a rash on the RLQ - anterior and posterior -erythematous, maculo-papular, non blanching, non tender, no vesicles.  Neurological: She is alert and oriented to person, place, and time.  Skin: Skin is warm and dry.    ED Course  Procedures (including critical care time)  Labs Reviewed  CBC WITH DIFFERENTIAL - Abnormal; Notable for the following:    RBC 3.76 (*)     Hemoglobin 11.2 (*)     HCT 33.3 (*)     All other components within normal limits  COMPREHENSIVE METABOLIC PANEL - Abnormal; Notable for the following:    Glucose, Bld 136 (*)     Albumin 3.4 (*)     GFR calc non Af Amer 46 (*)     GFR calc Af Amer 53 (*)     All other components within normal limits  URINALYSIS, ROUTINE W REFLEX MICROSCOPIC - Abnormal; Notable  for the following:    APPearance CLOUDY (*)     Nitrite POSITIVE (*)     Leukocytes, UA LARGE (*)     All other components within normal limits  URINE MICROSCOPIC-ADD ON - Abnormal; Notable for the following:    Squamous Epithelial / LPF FEW (*)     Bacteria, UA MANY (*)     All other components within normal limits  URINE CULTURE  LACTIC ACID, PLASMA  APTT  PROTIME-INR  TROPONIN I   No results found.   No diagnosis found.    MDM  DDx includes: SBO ACS syndrome Aortic Dissection Colitis AAA Tumors Colitis Intra abdominal abscess Thrombosis Mesenteric ischemia Diverticulitis Peritonitis Appendicitis Hernia Nephrolithiasis Pyelonephritis UTI/Cystitis  Pt comes in with cc of abd pain. Pt has intermittent abd pain, right sided, and in the flank region, and she on exam had left lower quadrant pain as well. She has hx of afib, CAD, CHF - so she is at risk for mesenteric ischemia. She has had multiple surgeries - SBO is less likely, but possible. Finally, renal stone, or pyelo, Uti are possible.  Will get basic labs and she will likely need a CT abd.  The rash appears to be zoster like - but she has no pain  at all, and so i am hesitant at starting her on avyclovir at this time.     Date: 12/26/2011  Rate: 60  Rhythm: paced rhythm  QRS Axis: normal  Intervals: normal  ST/T Wave abnormalities: normal  Conduction Disutrbances:none  Narrative Interpretation:   Old EKG Reviewed: unchanged     Derwood Kaplan, MD 12/26/11 1821  UA is positive. Will get CT, but with oral contrast only, as the + UA makes [place] think that this pain is likely pyelonephritis related, and less ischemia related. Lactate has been ordered. Pt has several comorbidities, but she is tolerating po, has good support, and has f/u - so if CT is normal, will discharge her.  Derwood Kaplan, MD 12/26/11 1835  Pt's CT is mostly unremarkable - however, the pleural effusion is acute. Concerns  for pneumonia based on that  - however, patient and family confirms that cough is not new, they confirm that there is no fevers, pleuritic chest pain, increased O2 requirement, no elevated WC. I ran the case by the Hospitalist, and they think he might be a good candidate for outpatient management. Pt has reliable family, and they understand that if the pain gets worse, if there is any fevers, change in mental status - they need to bring her in right away. They are confident that she could be seen by pcp on Monday The rash - not sure what it is right now - but again, doesn't appear to be zoster clinically, as she continues to be pain free.  Derwood Kaplan, MD 12/26/11 [phone]

## 2011-12-26 NOTE — ED Notes (Signed)
Pt has had abdominal pain and urinary symptoms.  She was seen at [facility] yesterday for an ultrasound and labs.  Sent to ED for further evaluation.

## 2011-12-26 NOTE — ED Notes (Signed)
Called into pt's room by daughter r/t hematoma below IV site-pt denies pain to IV site-daughter requested IV to be removed and ice pack applied-done

## 2011-12-26 NOTE — ED Notes (Signed)
Per daughter pt is unable to swallow thin liquids-hx of dysphagia-pt and daughter refused NG tube for CT contrast-no thickit in facility-daughter to home to obtain pt's own thickening agent for CT contrast

## 2011-12-30 LAB — URINE CULTURE: Colony Count: >=100000

## 2011-12-31 NOTE — ED Notes (Signed)
+  Urine. Patient treated with Keflex. Sensitive to same. Per protocol MD. °

## 2012-01-06 ENCOUNTER — Emergency Department (HOSPITAL_BASED_OUTPATIENT_CLINIC_OR_DEPARTMENT_OTHER): Payer: Medicare Other

## 2012-01-06 ENCOUNTER — Emergency Department (HOSPITAL_BASED_OUTPATIENT_CLINIC_OR_DEPARTMENT_OTHER)
Admission: EM | Admit: 2012-01-06 | Discharge: 2012-01-06 | Disposition: A | Discharge status: Home / Self Care | Payer: Medicare Other | Attending: Emergency Medicine | Admitting: Emergency Medicine

## 2012-01-06 ENCOUNTER — Encounter (HOSPITAL_BASED_OUTPATIENT_CLINIC_OR_DEPARTMENT_OTHER): Payer: Self-pay | Admitting: *Deleted

## 2012-01-06 DIAGNOSIS — Z7901 Long term (current) use of anticoagulants: Secondary | ICD-10-CM | POA: Insufficient documentation

## 2012-01-06 DIAGNOSIS — Z7982 Long term (current) use of aspirin: Secondary | ICD-10-CM | POA: Insufficient documentation

## 2012-01-06 DIAGNOSIS — Z90710 Acquired absence of both cervix and uterus: Secondary | ICD-10-CM | POA: Insufficient documentation

## 2012-01-06 DIAGNOSIS — J449 Chronic obstructive pulmonary disease, unspecified: Secondary | ICD-10-CM | POA: Insufficient documentation

## 2012-01-06 DIAGNOSIS — E785 Hyperlipidemia, unspecified: Secondary | ICD-10-CM | POA: Insufficient documentation

## 2012-01-06 DIAGNOSIS — R5383 Other fatigue: Secondary | ICD-10-CM | POA: Insufficient documentation

## 2012-01-06 DIAGNOSIS — Z79899 Other long term (current) drug therapy: Secondary | ICD-10-CM | POA: Insufficient documentation

## 2012-01-06 DIAGNOSIS — Z8719 Personal history of other diseases of the digestive system: Secondary | ICD-10-CM | POA: Insufficient documentation

## 2012-01-06 DIAGNOSIS — J4489 Other specified chronic obstructive pulmonary disease: Secondary | ICD-10-CM | POA: Insufficient documentation

## 2012-01-06 DIAGNOSIS — Z8679 Personal history of other diseases of the circulatory system: Secondary | ICD-10-CM | POA: Insufficient documentation

## 2012-01-06 DIAGNOSIS — R109 Unspecified abdominal pain: Secondary | ICD-10-CM

## 2012-01-06 DIAGNOSIS — I509 Heart failure, unspecified: Secondary | ICD-10-CM | POA: Insufficient documentation

## 2012-01-06 DIAGNOSIS — R5381 Other malaise: Secondary | ICD-10-CM | POA: Insufficient documentation

## 2012-01-06 DIAGNOSIS — Z87891 Personal history of nicotine dependence: Secondary | ICD-10-CM | POA: Insufficient documentation

## 2012-01-06 DIAGNOSIS — I2510 Atherosclerotic heart disease of native coronary artery without angina pectoris: Secondary | ICD-10-CM | POA: Insufficient documentation

## 2012-01-06 DIAGNOSIS — K5900 Constipation, unspecified: Secondary | ICD-10-CM

## 2012-01-06 DIAGNOSIS — Z8739 Personal history of other diseases of the musculoskeletal system and connective tissue: Secondary | ICD-10-CM | POA: Insufficient documentation

## 2012-01-06 DIAGNOSIS — Z9889 Other specified postprocedural states: Secondary | ICD-10-CM | POA: Insufficient documentation

## 2012-01-06 DIAGNOSIS — R110 Nausea: Secondary | ICD-10-CM | POA: Insufficient documentation

## 2012-01-06 DIAGNOSIS — Z950 Presence of cardiac pacemaker: Secondary | ICD-10-CM | POA: Insufficient documentation

## 2012-01-06 DIAGNOSIS — Z8701 Personal history of pneumonia (recurrent): Secondary | ICD-10-CM | POA: Insufficient documentation

## 2012-01-06 LAB — CBC WITH DIFFERENTIAL/PLATELET
Basophils Absolute: 0 10*3/uL (ref 0.0–0.1)
Basophils Relative: 0 % (ref 0–1)
Eosinophils Absolute: 0.1 10*3/uL (ref 0.0–0.7)
Hemoglobin: 12.3 g/dL (ref 12.0–15.0)
MCH: 28.7 pg (ref 26.0–34.0)
MCHC: 31.9 g/dL (ref 30.0–36.0)
Monocytes Absolute: 1 10*3/uL (ref 0.1–1.0)
Monocytes Relative: 9 % (ref 3–12)
Neutro Abs: 7.9 10*3/uL — ABNORMAL HIGH (ref 1.7–7.7)
Neutrophils Relative %: 72 % (ref 43–77)
RDW: 14.5 % (ref 11.5–15.5)

## 2012-01-06 LAB — COMPREHENSIVE METABOLIC PANEL
AST: 31 U/L (ref 0–37)
Albumin: 3.7 g/dL (ref 3.5–5.2)
BUN: 20 mg/dL (ref 6–23)
Creatinine, Ser: 1.1 mg/dL (ref 0.50–1.10)
Potassium: 4.2 mEq/L (ref 3.5–5.1)
Total Protein: 7.1 g/dL (ref 6.0–8.3)

## 2012-01-06 LAB — URINALYSIS, ROUTINE W REFLEX MICROSCOPIC
Glucose, UA: NEGATIVE mg/dL
Hgb urine dipstick: NEGATIVE
Leukocytes, UA: NEGATIVE
pH: 7 (ref 5.0–8.0)

## 2012-01-06 LAB — PROTIME-INR
INR: 1.19 (ref 0.00–1.49)
Prothrombin Time: 14.9 seconds (ref 11.6–15.2)

## 2012-01-06 LAB — LIPASE, BLOOD: Lipase: 44 U/L (ref 11–59)

## 2012-01-06 LAB — DIGOXIN LEVEL: Digoxin Level: 1 ng/mL (ref 0.8–2.0)

## 2012-01-06 MED ORDER — IOHEXOL 300 MG/ML  SOLN
100.0000 mL | Freq: Once | INTRAMUSCULAR | Status: AC | PRN
  Administered 2012-01-06: 100 mL via INTRAVENOUS

## 2012-01-06 MED ORDER — FLEET ENEMA 7-19 GM/118ML RE ENEM
1.0000 | ENEMA | Freq: Once | RECTAL | Status: AC
  Administered 2012-01-06: 1 via RECTAL
  Filled 2012-01-06: qty 1

## 2012-01-06 MED ORDER — SODIUM CHLORIDE 0.9 % IV BOLUS (SEPSIS)
500.0000 mL | Freq: Once | INTRAVENOUS | Status: AC
  Administered 2012-01-06: 18:00:00 via INTRAVENOUS

## 2012-01-06 MED ORDER — MORPHINE SULFATE 2 MG/ML IJ SOLN
INTRAMUSCULAR | Status: AC
  Filled 2012-01-06: qty 1

## 2012-01-06 MED ORDER — MORPHINE SULFATE 2 MG/ML IJ SOLN
2.0000 mg | Freq: Once | INTRAMUSCULAR | Status: AC
  Administered 2012-01-06: 2 mg via INTRAVENOUS

## 2012-01-06 MED ORDER — ONDANSETRON HCL 4 MG/2ML IJ SOLN
4.0000 mg | Freq: Once | INTRAMUSCULAR | Status: AC
Start: 2012-01-06 — End: 2012-01-06
  Administered 2012-01-06: 4 mg via INTRAVENOUS
  Filled 2012-01-06: qty 2

## 2012-01-06 MED ORDER — IOHEXOL 300 MG/ML  SOLN: 25.0000 mL | INTRAMUSCULAR | Status: AC

## 2012-01-06 NOTE — ED Provider Notes (Signed)
History     CSN: [id number]  Arrival date & time 01/06/12  1541   First MD Initiated Contact with Patient 01/06/12 1556      Chief Complaint  Patient presents with  . Back Pain    (Consider location/radiation/quality/duration/timing/severity/associated sxs/prior treatment) HPI Comments: Patient presents with right-sided abdominal pain. She was seen here in emergency apartment on November 1 for similar pain. She had labs and a CT scan at that time. She was noted to have some renal stones but no obstructing kidney stones. She was also noted to have a questionable pneumonia and urinary tract infection. She was treated with Keflex and Zithromax which she has completed. She has one more dose of the Keflex. She was also given prednisone. She states over the last week her pain has gotten worse and more frequent. She describes an intermittent pain in her right midabdomen which at times radiates to her back. Seems to be getting more frequent and more intense in nature. She has no associated vomiting however she at times feels nauseated. She has no urinary symptoms or hematuria. She has no known fevers. She denies any cough or chest congestion. She does have a history of constipation however her family members state that she's been having improved bowel movements and had a large bowel movement 2 days ago. She's been having worsening pain even with the increase in her bowel movements. She denies any diarrhea. She is status post appendectomy and cholecystectomy. Per her daughter, it she's also been feeling increasingly fatigued over the last couple days and has had some decreased appetite  Patient is a 78 y.o. female presenting with back pain.  Back Pain  Associated symptoms include abdominal pain. Pertinent negatives include no chest pain, no fever, no numbness, no headaches and no weakness.    Past Medical History  Diagnosis Date  . Femoral artery occlusion   . Coronary artery disease   . COPD  (chronic obstructive pulmonary disease)   . Atrial fibrillation   . Dysphagia     pills have to be crushed and given with applesauce  . Arthritis   . Degenerative arthritis of spine   . Pneumonia     hx aspiraton pna  . Bleeding nose Dec. 2012  . H/O hiatal hernia   . GERD (gastroesophageal reflux disease)   . DDD (degenerative disc disease)   . Osteoarthritis   . Peripheral vascular disease   . ESBL (extended spectrum beta-lactamase) producing bacteria infection   . Constipation   . Gangrene     of the bowels  . CHF (congestive heart failure)   . Hyperlipidemia   . Dysphagia     Past Surgical History  Procedure Date  . Pacemaker insertion   . Abdominal hysterectomy   . Appendectomy   . Stomach surgery 1940's    stomach surgery due to gangrene  . Cholecystectomy   . Cataract extraction, bilateral     History reviewed. No pertinent family history.  History  Substance Use Topics  . Smoking status: Former Smoker -- 0.5 packs/day for 50 years    Types: Cigarettes    Quit date: 02/13/1989  . Smokeless tobacco: Never Used  . Alcohol Use: No    OB History    Grav Para Term Preterm Abortions TAB SAB Ect Mult Living                  Review of Systems  Constitutional: Positive for fatigue. Negative for fever, chills and diaphoresis.  HENT:  Negative for congestion, rhinorrhea and sneezing.   Eyes: Negative.   Respiratory: Negative for cough, chest tightness and shortness of breath.   Cardiovascular: Negative for chest pain and leg swelling.  Gastrointestinal: Positive for nausea, abdominal pain and constipation. Negative for vomiting, diarrhea and blood in stool.  Genitourinary: Negative for frequency, hematuria, flank pain and difficulty urinating.  Musculoskeletal: Positive for back pain. Negative for arthralgias.  Skin: Negative for rash.  Neurological: Negative for dizziness, speech difficulty, weakness, numbness and headaches.    Allergies  Sulfonamide  derivatives and Penicillins  Home Medications   Current Outpatient Rx  Name  Route  Sig  Dispense  Refill  . ACETAMINOPHEN 500 MG PO TABS   Oral   Take 500 mg by mouth every 6 (six) hours as needed.         . ACETAMINOPHEN-CODEINE #3 300-30 MG PO TABS   Oral   Take 1-2 tablets by mouth every 6 (six) hours as needed for pain.   15 tablet   0   . ACETYLCYSTEINE IN   Inhalation   Inhale 3 mLs into the lungs daily.         . ALBUTEROL SULFATE (5 MG/ML) 0.5% IN NEBU   Nebulization   Take 0.5 mLs (2.5 mg total) by nebulization every 6 (six) hours as needed for wheezing or shortness of breath.   20 mL   0   . ARFORMOTEROL TARTRATE 15 MCG/2ML IN NEBU   Nebulization   Take 2 mLs (15 mcg total) by nebulization 2 (two) times daily. DX:  496, FILE WITH MCR PART B   120 mL   6   . VITAMIN C 1000 MG PO TABS   Oral   Take 4,000 mg by mouth daily.           . ASPIRIN 81 MG PO TABS   Oral   Take 81 mg by mouth every evening.          . AZITHROMYCIN 250 MG PO TABS   Oral   Take 1 tablet (250 mg total) by mouth daily. Take first 2 tablets together, then 1 every day until finished.   6 tablet   0   . BETHANECHOL CHLORIDE 10 MG PO TABS   Oral   Take 10 mg by mouth 3 (three) times daily.           . BUDESONIDE 0.5 MG/2ML IN SUSP      USE 1 VIAL IN NEBULIZER TWICE A DAY   120 mL   5     MUST HAVE DIAGNOSIS CODE Dx code 496   . BUDESONIDE 0.5 MG/2ML IN SUSP      1 vial in nebulizer twice daily Dx 496   120 mL   5     Dx 496   . CALCIUM CARBONATE-VITAMIN D 500-500 MG-UNIT PO CHEW   Oral   Chew 1 tablet by mouth 2 (two) times daily.           . CEPHALEXIN 500 MG PO CAPS   Oral   Take 1 capsule (500 mg total) by mouth 2 (two) times daily.   20 capsule   0   . VITAMIN D 2000 UNITS PO TABS   Oral   Take 2,000 Units by mouth daily.           [name] Kitchen CLONIDINE HCL 0.1 MG/24HR TD PTWK   Transdermal   Place 1 patch onto the skin once a week.         [name] Kitchen  CRANBERRY EXTRACT PO   Oral   Take 1 tablet by mouth 2 (two) times daily.           [name] Kitchen DIGOXIN 0.25 MG PO TABS   Oral   Take 0.125 mcg by mouth every morning. Pt takes (1/2 tab) for 0.18mcg dose         . DILTIAZEM HCL ER COATED BEADS 240 MG PO CP24   Oral   Take 240 mg by mouth daily.         [name] Kitchen ESOMEPRAZOLE MAGNESIUM 40 MG PO CPDR   Oral   Take 40 mg by mouth daily before breakfast.           . STARCH (THICKENING) PO POWD      With water/liquids.   1 Can   0   . FUROSEMIDE 20 MG PO TABS   Oral   Take 1 tablet (20 mg total) by mouth daily.   30 tablet   11   . FUROSEMIDE 40 MG PO TABS   Oral   Take 40 mg by mouth every morning.          . GUAIFENESIN ER 600 MG PO TB12   Oral   Take 600 mg by mouth 2 (two) times daily.           . GUAIFENESIN 100 MG/5ML PO SOLN   Oral   Take 5 mLs by mouth every 4 (four) hours as needed. cough          . HYDROCODONE-ACETAMINOPHEN 5-325 MG PO TABS   Oral   Take 1 tablet by mouth every 6 (six) hours as needed. pain          . IPRATROPIUM BROMIDE 0.02 % IN SOLN      USE 1 NEBULE EVERY 6 HOURS AS NEEDED FOR WHEEZING   120 mL   5     Dx 496   . IRON PO   Oral   Take 1 tablet by mouth daily.         [name] Kitchen LEVALBUTEROL TARTRATE 45 MCG/ACT IN AERO   Inhalation   Inhale 1-2 puffs into the lungs every 4 (four) hours as needed.         [name] Kitchen LORAZEPAM 0.5 MG PO TABS   Oral   Take 0.25 mg by mouth daily.         [name] Kitchen MACULAR VITAMIN BENEFIT PO   Oral   Take 2 tablets by mouth.           . ESTROVEN PO   Oral   Take 1 tablet by mouth daily.           [name] Kitchen FISH OIL 1000 MG PO CAPS   Oral   Take 1 capsule by mouth 2 (two) times daily.         [name] Kitchen PHENAZOPYRIDINE HCL 200 MG PO TABS   Oral   Take 1 tablet (200 mg total) by mouth 3 (three) times daily.   6 tablet   0   . POTASSIMIN PO      1.5 tsp daily         . PROCHLORPERAZINE MALEATE 5 MG PO TABS   Oral   Take 5 mg by mouth every 6 (six) hours as  needed.         . WARFARIN SODIUM 1 MG PO TABS      Take as directed           BP 132/56  Pulse 62  Resp 22  SpO2 100%  Physical Exam  Constitutional: She is oriented to person, place, and time. She appears well-developed and well-nourished.  HENT:  Head: Normocephalic and atraumatic.  Eyes: Pupils are equal, round, and reactive to light.  Neck: Normal range of motion. Neck supple.  Cardiovascular: Normal rate, regular rhythm and normal heart sounds.   Pulmonary/Chest: Effort normal and breath sounds normal. No respiratory distress. She has no wheezes. She has no rales. She exhibits no tenderness.  Abdominal: Soft. Bowel sounds are normal. There is tenderness (Mild tenderness to the right mid and lower abdomen. No CVA tenderness. No pain over the ribs.). There is no rebound and no guarding.  Musculoskeletal: Normal range of motion. She exhibits no edema.  Lymphadenopathy:    She has no cervical adenopathy.  Neurological: She is alert and oriented to person, place, and time.  Skin: Skin is warm and dry. No rash noted.  Psychiatric: She has a normal mood and affect.    ED Course  Procedures (including critical care time)  Results for orders placed during the hospital encounter of 01/06/12  CBC WITH DIFFERENTIAL      Component Value Range   WBC 11.0 (*) 4.0 - 10.5 K/uL   RBC 4.28  3.87 - 5.11 MIL/uL   Hemoglobin 12.3  12.0 - 15.0 g/dL   HCT 16.1  09.6 - 04.5 %   MCV 90.2  78.0 - 100.0 fL   MCH 28.7  26.0 - 34.0 pg   MCHC 31.9  30.0 - 36.0 g/dL   RDW 40.9  81.1 - 91.4 %   Platelets 272  150 - 400 K/uL   Neutrophils Relative 72  43 - 77 %   Neutro Abs 7.9 (*) 1.7 - 7.7 K/uL   Lymphocytes Relative 18  12 - 46 %   Lymphs Abs 2.0  0.7 - 4.0 K/uL   Monocytes Relative 9  3 - 12 %   Monocytes Absolute 1.0  0.1 - 1.0 K/uL   Eosinophils Relative 1  0 - 5 %   Eosinophils Absolute 0.1  0.0 - 0.7 K/uL   Basophils Relative 0  0 - 1 %   Basophils Absolute 0.0  0.0 - 0.1 K/uL    COMPREHENSIVE METABOLIC PANEL      Component Value Range   Sodium 138  135 - 145 mEq/L   Potassium 4.2  3.5 - 5.1 mEq/L   Chloride 94 (*) 96 - 112 mEq/L   CO2 34 (*) 19 - 32 mEq/L   Glucose, Bld 119 (*) 70 - 99 mg/dL   BUN 20  6 - 23 mg/dL   Creatinine, Ser 7.82  0.50 - 1.10 mg/dL   Calcium 9.4  8.4 - 95.6 mg/dL   Total Protein 7.1  6.0 - 8.3 g/dL   Albumin 3.7  3.5 - 5.2 g/dL   AST 31  0 - 37 U/L   ALT 37 (*) 0 - 35 U/L   Alkaline Phosphatase 82  39 - 117 U/L   Total Bilirubin 0.5  0.3 - 1.2 mg/dL   GFR calc non Af Amer 41 (*) >90 mL/min   GFR calc Af Amer 48 (*) >90 mL/min  URINALYSIS, ROUTINE W REFLEX MICROSCOPIC      Component Value Range   Color, Urine YELLOW  YELLOW   APPearance CLEAR  CLEAR   Specific Gravity, Urine 1.010  1.005 - 1.030   pH 7.0  5.0 - 8.0   Glucose, UA NEGATIVE  NEGATIVE  mg/dL   Hgb urine dipstick NEGATIVE  NEGATIVE   Bilirubin Urine NEGATIVE  NEGATIVE   Ketones, ur NEGATIVE  NEGATIVE mg/dL   Protein, ur NEGATIVE  NEGATIVE mg/dL   Urobilinogen, UA 0.2  0.0 - 1.0 mg/dL   Nitrite NEGATIVE  NEGATIVE   Leukocytes, UA NEGATIVE  NEGATIVE  LIPASE, BLOOD      Component Value Range   Lipase 44  11 - 59 U/L  PROTIME-INR      Component Value Range   Prothrombin Time 14.9  11.6 - 15.2 seconds   INR 1.19  0.00 - 1.49  DIGOXIN LEVEL      Component Value Range   Digoxin Level 1.0  0.8 - 2.0 ng/mL   Ct Abdomen Pelvis Wo Contrast  12/26/2011  *RADIOLOGY REPORT*  Clinical Data: Abdominal pain.  UTI.  CT ABDOMEN AND PELVIS WITHOUT CONTRAST  Technique:  Multidetector CT imaging of the abdomen and pelvis was performed following the standard protocol without intravenous contrast.  Comparison: Abdominal series 08/08/2007  Findings: COPD changes noted in the lung bases.  There is a small right pleural effusion.  Dense atelectasis in the right lower lobe. Bronchi are calcified with mucus plugging.  Left base atelectasis or scarring.  Prior cholecystectomy.  Liver, liver  and pancreas have an unremarkable unenhanced appearance.  Lobular contours of the spleen without definite visible focal abnormality.  2.1 cm low density lesion in the mid pole of the left kidney, likely small cyst. Bilateral nephrolithiasis without hydronephrosis.  Ureters decompressed.  Large stool burden throughout the colon.  Descending colonic and sigmoid diverticulosis.  No active diverticulitis.  Small bowel is decompressed.  Mild fullness of the adrenal glands bilaterally compatible with hyperplasia.  No acute bony abnormality.  IMPRESSION: Descending colonic and sigmoid diverticulosis.  No active diverticulitis.  Large stool burden throughout the colon.  Bilateral nephrolithiasis.  No hydronephrosis.  Small right effusion.  Dense consolidation in the right lower lobe with calcified bronchi and mucous plugging. I suspect this is a chronic finding, but given the effusion, recommend clinical correlation to exclude acute pneumonia.  COPD.   Original Report Authenticated By: Charlett Nose, M.D.    Dg Chest 2 View  12/26/2011  *RADIOLOGY REPORT*  Clinical Data: Chest pain.  CHEST - 2 VIEW  Comparison: Chest x-ray 05/05/2011.  Findings: Bibasilar opacities (right greater than left), similar to the prior examination, favored to reflect a bibasilar areas of atelectasis and/or scarring, with superimposed small right-sided chronic pleural effusion. On the right, there is likely complete right lower lobe atelectasis.  Slight coarsening of interstitial markings throughout the lungs bilaterally.  No definite acute consolidative airspace disease.  No evidence of pulmonary edema. Heart size is normal.  Upper mediastinal contours are unremarkable. Atherosclerosis in the thoracic aorta.  Left-sided pacemaker device in place with lead tip projecting over the expected location of the right ventricular apex.  Bilateral apical pleuroparenchymal thickening with some calcifications in the right apex, unchanged, most compatible  with scarring.  IMPRESSION: 1.  Small chronic right-sided pleural effusion with bibasilar atelectasis and/or scarring (likely complete right lower lobe atelectasis).  Overall appearance is very similar to the prior study from 05/05/2011.  Consideration for follow up non-emergent contrast enhanced chest CT may be warranted to exclude the possibility of a central obstructing right hilar or endobronchial neoplasm. 2.  Atherosclerosis.   Original Report Authenticated By: Trudie Reed, M.D.    Ct Abdomen Pelvis W Contrast  01/06/2012  *RADIOLOGY REPORT*  Clinical Data: Right  lower quadrant pain, prior hysterectomy and appendectomy  CT ABDOMEN AND PELVIS WITH CONTRAST  Technique:  Multidetector CT imaging of the abdomen and pelvis was performed following the standard protocol during bolus administration of intravenous contrast.  Contrast: OMNIPAQUE IOHEXOL 300 MG/ML  SOLN  Comparison: CT abdomen pelvis of 12/26/2011  Findings: The previous noted right pleural effusion appears to have decreased somewhat in volume.  Again there is consolidation in the right lower lobe with bronchiectasis and mucous plugging.  The left lung base is unremarkable other than chronic change.  Cardiomegaly is stable.  The liver enhances with no focal abnormality.  The central hepatic ducts are slightly prominent in this patient who has undergone prior cholecystectomy and the common bile duct remains stable and prominent.  The pancreas appears somewhat atrophic and the pancreatic duct is not dilated.  The adrenal glands are stable in appearance.  The spleen is somewhat lobular but stable.  The stomach is decompressed cannot be evaluated.  The kidneys enhance and a cyst emanates from the mid pole of the left kidney posterolaterally.  No solid renal lesion is seen. Renovascular calcification again is noted.  The abdominal aorta is normal in caliber with significant atheromatous change diffusely.  No adenopathy is seen.  The SMA appears  to be patent.  There is moderate distention of the urinary bladder.  No gross abnormality is seen.  The patient has previously undergone hysterectomy.  No fluid or mass is seen within the pelvis.  There are rectosigmoid and descending colon diverticula present.  A moderate amount of stool is noted throughout the entire colon.  The cecum is low in position and somewhat medially positioned possibly due to a long mesentery.  The terminal ileum is unremarkable. Significant lumbar scoliosis convex to the right is noted with diffuse degenerative disc disease.  IMPRESSION:  1.  There is a moderate to large amount of feces throughout the colon.  The cecum is somewhat medially positioned possibly on a long mesentery. 2.  No definite explanation for the patient's pain is noted. 3.  Slight decrease in right pleural effusion. 4.  No change in consolidation of much of the right lower lobe with bronchiectasis and mucous plug bronchi. 5.  Rectosigmoid and descending colon diverticula.   Original Report Authenticated By: Dwyane Dee, M.D.    Dg Abd Acute W/chest  01/06/2012  *RADIOLOGY REPORT*  Clinical Data: Right-sided abdominal pain  ACUTE ABDOMEN SERIES (ABDOMEN 2 VIEW & CHEST 1 VIEW)  Comparison: 12/26/2011  Findings: Cardiomediastinal silhouette is stable.  Stable small right pleural effusion with right lower lobe atelectasis.  Minimal left basilar atelectasis.  No pulmonary edema.  Single lead cardiac pacemaker is unchanged in position.  There is nonspecific nonobstructive bowel gas pattern.  Significant dextroscoliosis and degenerative changes lumbar spine again noted. No free abdominal air.  IMPRESSION: No acute disease.  Stable right pleural effusion with right lower lobe atelectasis.  Nonspecific nonobstructive bowel gas pattern.  No free abdominal air.   Original Report Authenticated By: Natasha Mead, M.D.        1. Abdominal pain   2. Constipation       MDM  Patient with ongoing intermittent right-sided  abdominal pain and increased fatigue. I don't think any evidence of abdominal infection. Her urine is clear. There is no evidence of pneumonia. There is no evidence of diverticulitis, bowel obstruction or other abdominal etiology for the pain. There is no evidence of kidney stones. I did discuss the findings with the  daughter. I feel that patient does need a trial of treating her constipation. We'll discharge her home with a fleets enema and she will use MiraLAX at home. I did advise her that she is close followup with her primary care physician to ensure that she's improving bronchitis or return here she has any worsening in symptoms. I did do a rectal exam that showed no evidence of impaction.        Rolan Bucco, MD 01/06/12 2025

## 2012-01-06 NOTE — ED Notes (Signed)
Pt recently completed antibiotics for pneumonia and is still taking keflex for uti

## 2012-01-31 IMAGING — CR DG HIP (WITH OR WITHOUT PELVIS) 2-3V*L*
3 series · 3 of 3 positions shown · non-contrast
Comparison: None.

CLINICAL DATA: Left hip pain, known known injury

LEFT HIP - COMPLETE 2+ VIEW

[t pelvis a.p.]
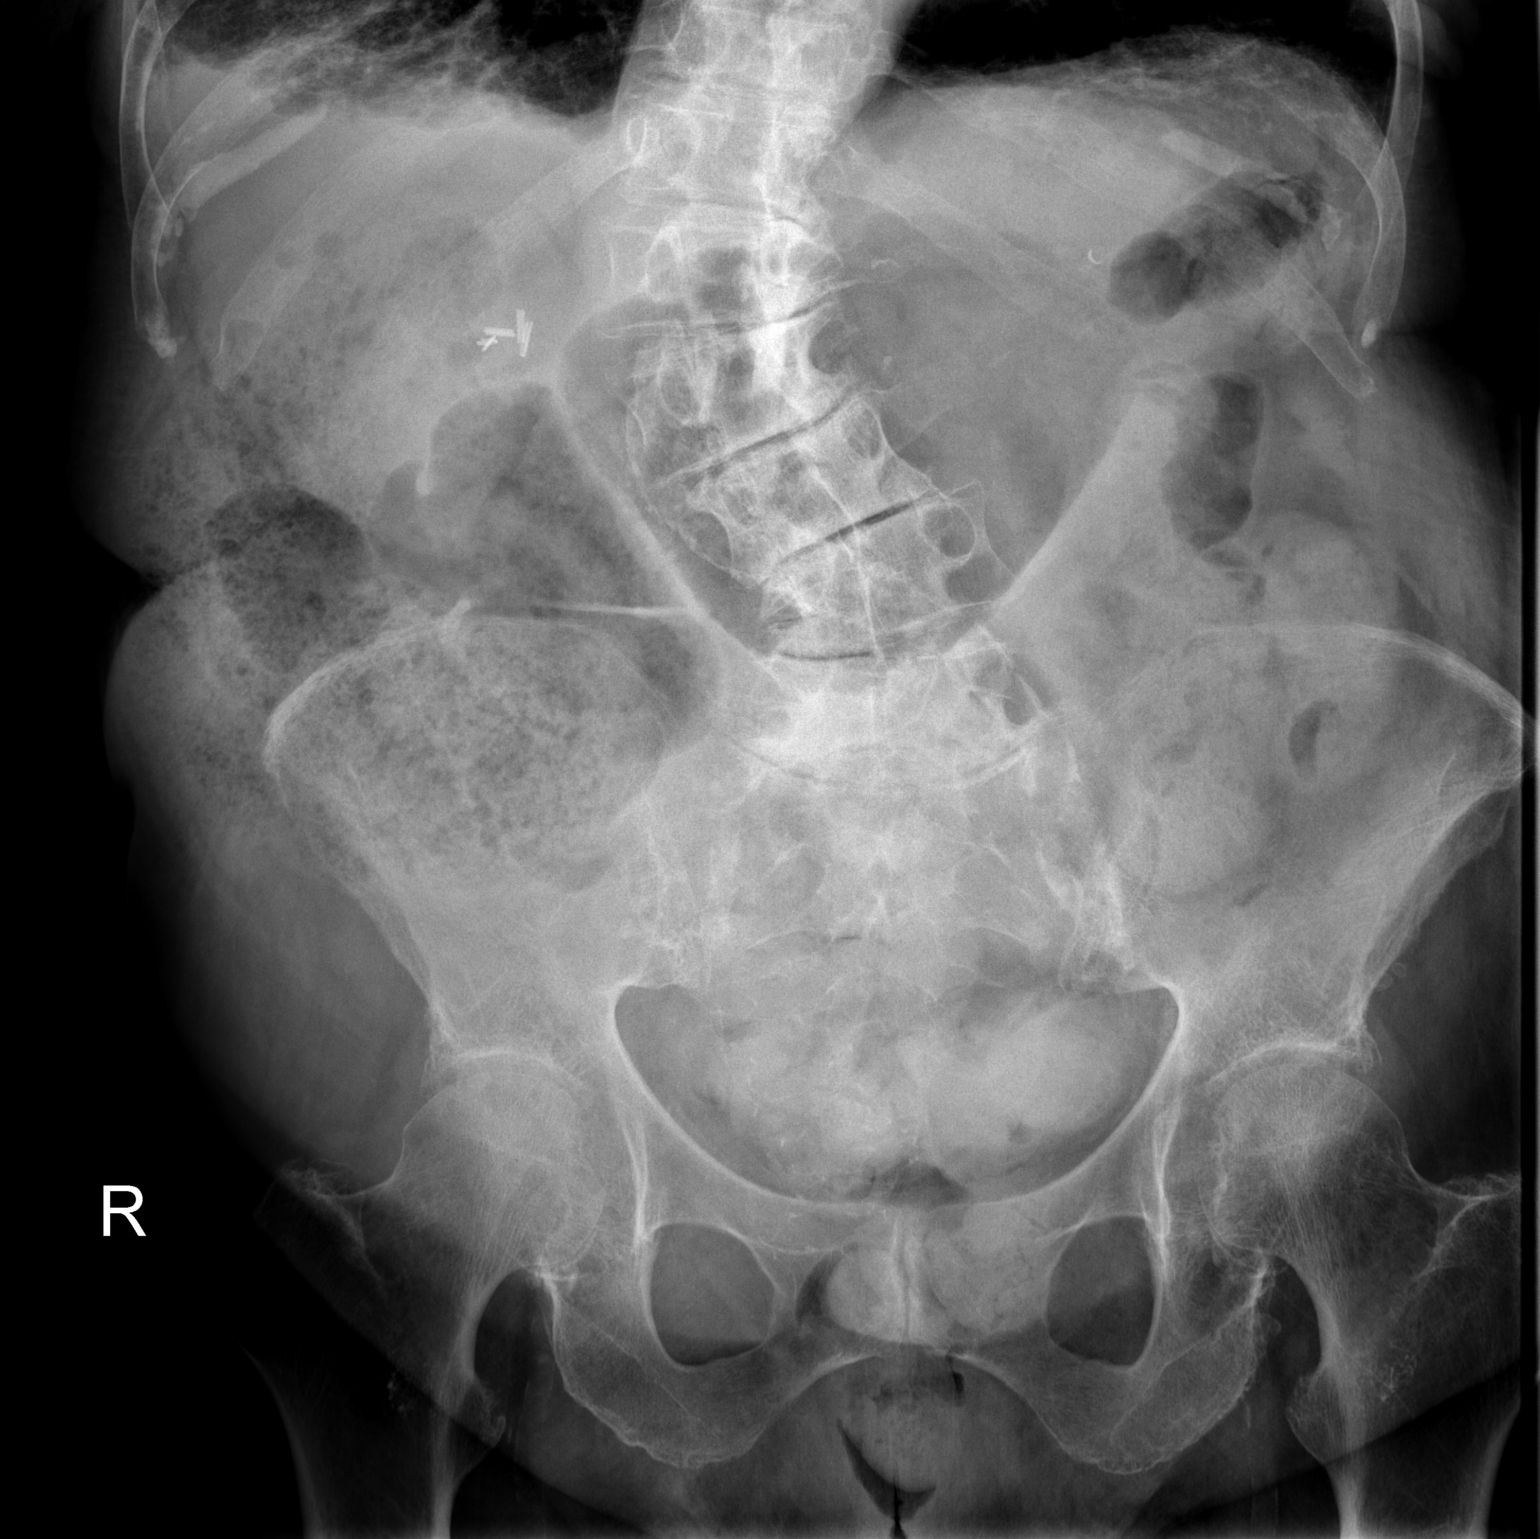

[t hip ap left]
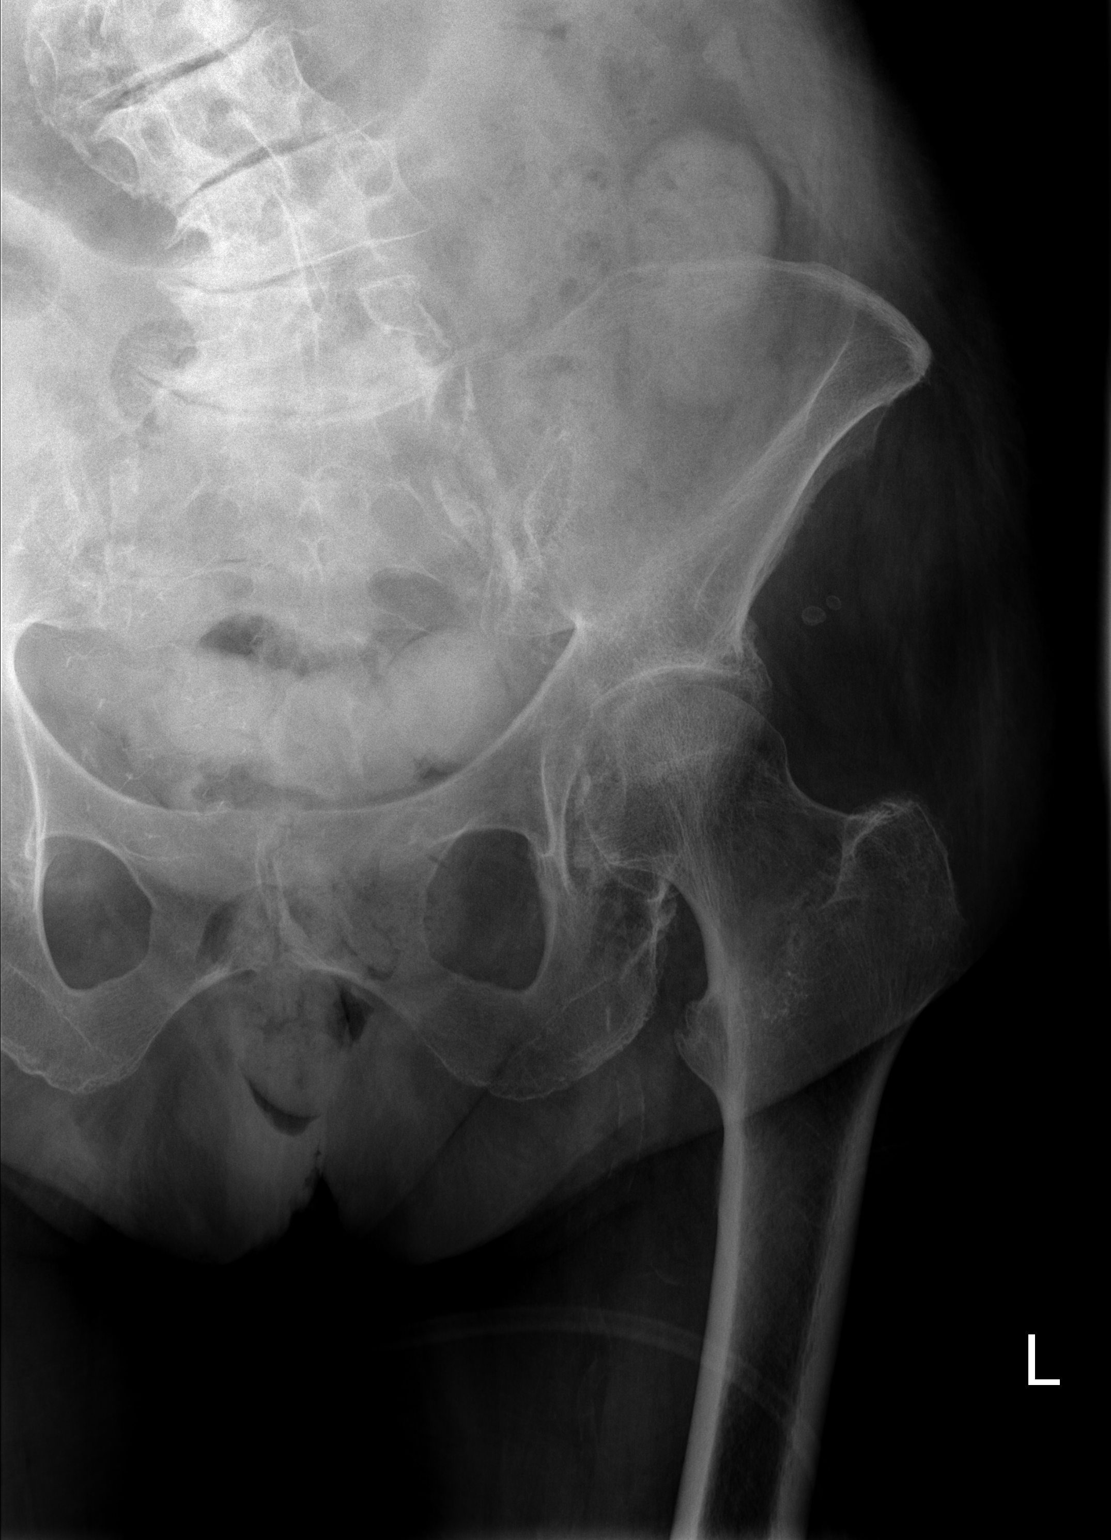

[t hip frog leg left]
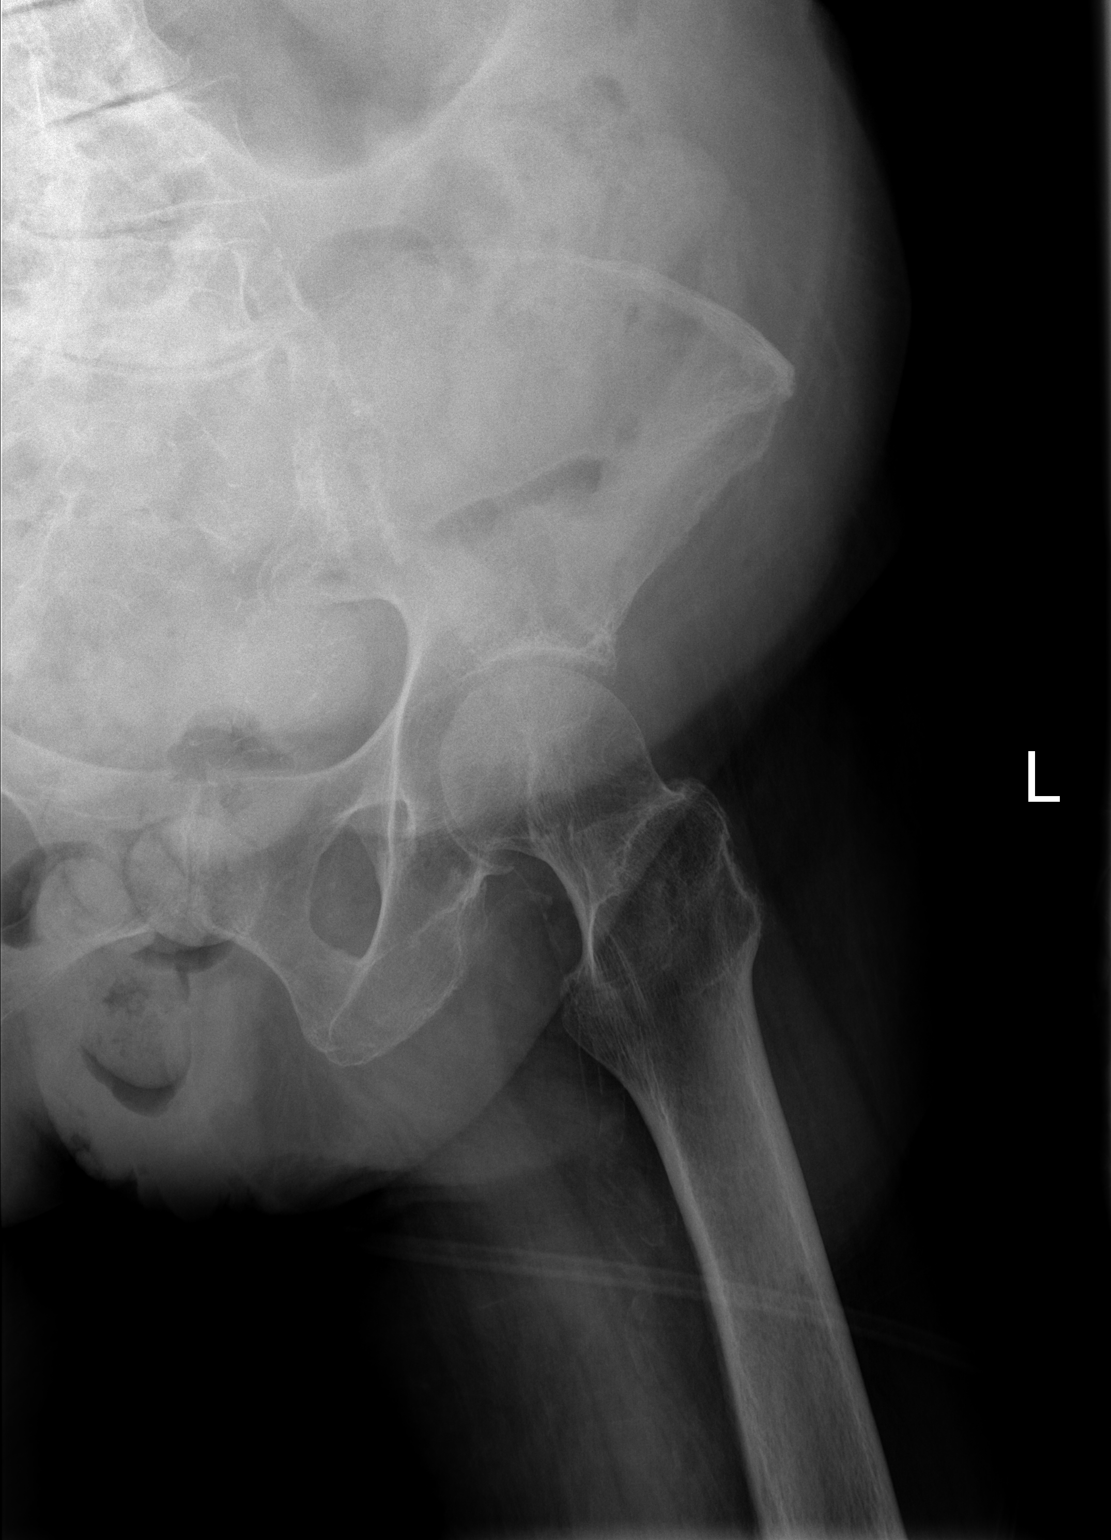

[3 of 3 positions shown; findings below may reference images not displayed]

FINDINGS: The bones are diffusely osteopenic.  There is a moderate
lumbar scoliosis convex to the right with associated degenerative
change.  Surgical clips are noted in the right upper quadrant from
prior cholecystectomy.  A moderate amount of feces is noted
throughout the colon.  Both hips are in normal position with
moderate degenerative joint disease for age.  No acute fracture is
seen.  The pelvic rami are intact.
IMPRESSION: 1.  Osteopenia.  No acute hip fracture.
2.  Lumbar scoliosis with diffuse degenerative change.

## 2012-02-04 IMAGING — CR DG CHEST 2V
2 series · 2 of 2 positions shown · non-contrast
Comparison: Chest radiograph performed 02/17/2011

CLINICAL DATA: Cough and fever.

CHEST - 2 VIEW

[w chest lat]
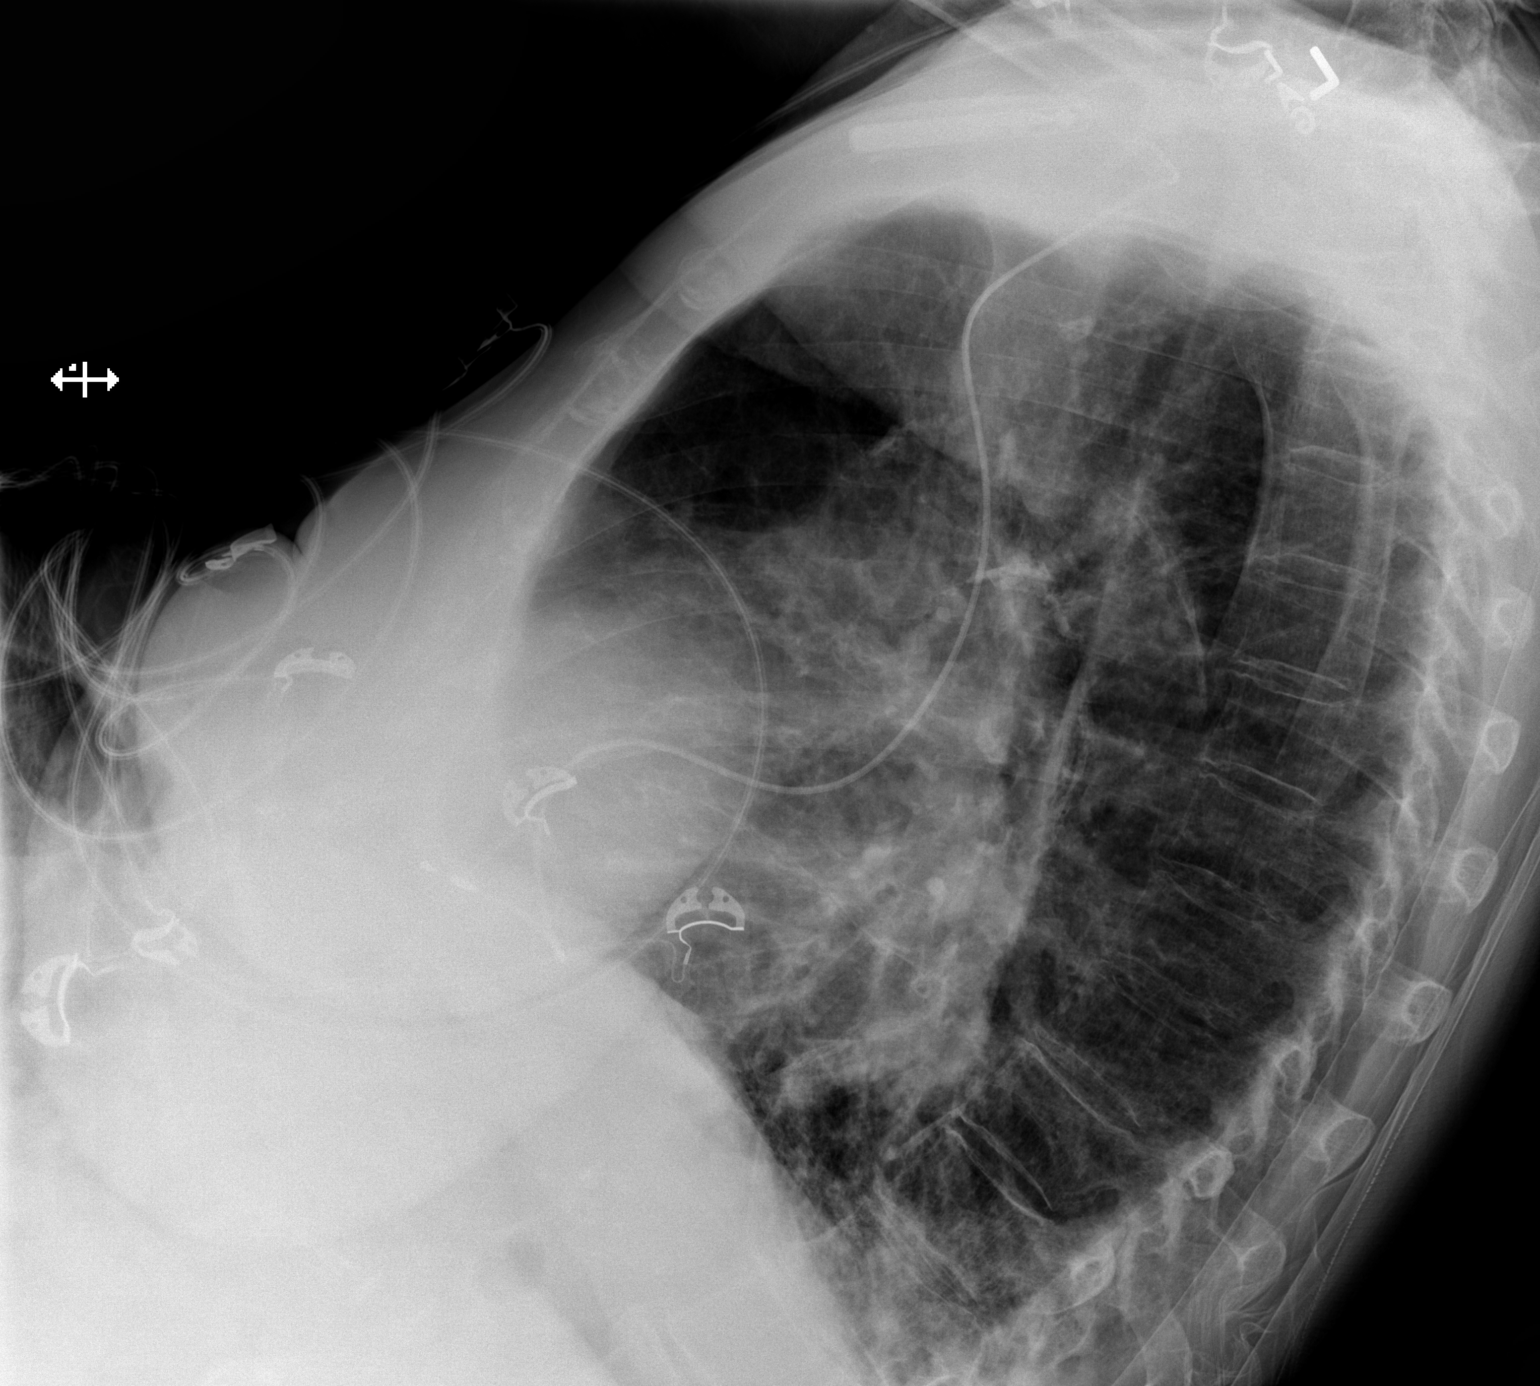

[x chest ap]
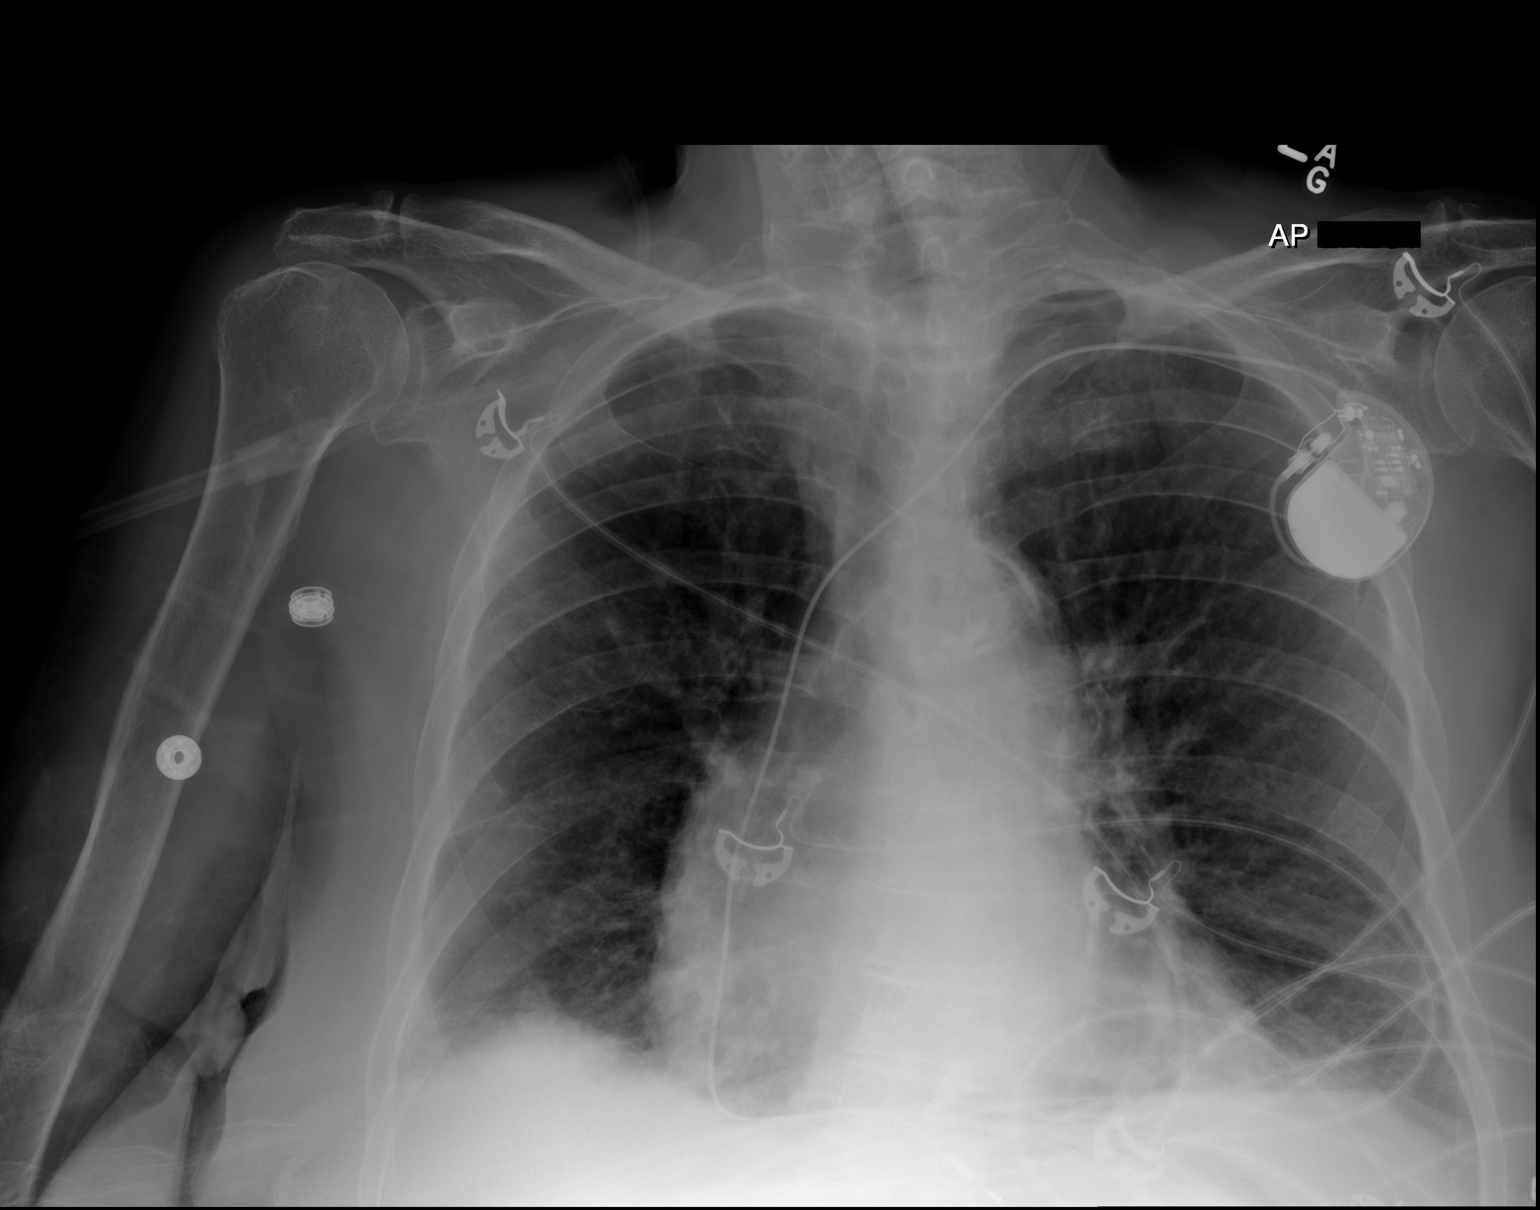

[2 of 2 positions shown; findings below may reference images not displayed]

FINDINGS: The lungs are well-aerated.  Patchy bibasilar airspace
opacities raise question for pneumonia, slightly more apparent than
on the prior study.  There is no evidence of pleural effusion or
pneumothorax.

The heart is borderline normal in size; a pacemaker is noted at the
left chest wall, with a single lead ending at the right ventricle.
No acute osseous abnormalities are seen.
IMPRESSION: Patchy bibasilar airspace opacities raise concern for pneumonia.

## 2012-05-29 ENCOUNTER — Other Ambulatory Visit: Payer: Self-pay | Admitting: Internal Medicine

## 2012-08-13 ENCOUNTER — Other Ambulatory Visit: Payer: Self-pay | Admitting: Internal Medicine

## 2012-08-16 ENCOUNTER — Telehealth: Payer: Self-pay | Admitting: Internal Medicine

## 2012-08-16 MED ORDER — IPRATROPIUM BROMIDE 0.02 % IN SOLN: RESPIRATORY_TRACT | Status: DC

## 2012-08-16 NOTE — Telephone Encounter (Signed)
Requesting rx for atrovent neb meds. rx sent with 2 refills. Pts care giver will call back an make appt within these 2 months.  Pt is 89 and has a hard time getting out in this weather.

## 2012-09-21 ENCOUNTER — Other Ambulatory Visit: Payer: Self-pay | Admitting: Internal Medicine

## 2012-09-22 ENCOUNTER — Telehealth: Payer: Self-pay | Admitting: Internal Medicine

## 2012-09-22 MED ORDER — BUDESONIDE 0.5 MG/2ML IN SUSP: RESPIRATORY_TRACT | Status: DC

## 2012-09-22 NOTE — Telephone Encounter (Signed)
Ok to refill 

## 2012-09-22 NOTE — Telephone Encounter (Signed)
Pt last seen 04-2011. Pt daughter called for refill on pt pulmicort. She states the pt uses it twice a day and she has been taking it regular. She states the pt has not been in for an OV because it is too difficult to get her out of the home. The pt daughter states they take good car of the pt and she is not having any issues at this time. Please advise if ok to refill meds? Carron Curie, CMA

## 2012-09-22 NOTE — Telephone Encounter (Signed)
Daughter is aware RX has been sent. Nothing further was needed

## 2012-09-23 ENCOUNTER — Telehealth: Payer: Self-pay | Admitting: Internal Medicine

## 2012-09-23 NOTE — Telephone Encounter (Signed)
i called and spoke with emily and stated this has been taking care of disregard the pone note

## 2012-11-15 ENCOUNTER — Telehealth: Payer: Self-pay | Admitting: Internal Medicine

## 2012-11-15 NOTE — Telephone Encounter (Signed)
I spoke with pt daughter. She stated pt's budesonide needs a PA. I advised if they are filling this under part B. I was advised was not sure they just have part D on file and was advised pt medicare is requesting this and gave me a # to call [phone] ID # [id number].  I called # and was advised pt is needing a PA for this medications. I initiated PA. It was denied under her part D but this may be covered under part B if the pharmacy has this.   I called [facility] and was advised they tried billing pt medicare part B but it is stating she is on hospice and stating they need to bill medicare part D. I advised them per daughter pt is no longer on hospice. They are going to call pt medicare to see what they need to do.  i called pt daughter and made her aware. Nothing further needed

## 2012-11-16 DIAGNOSIS — I2510 Atherosclerotic heart disease of native coronary artery without angina pectoris: Secondary | ICD-10-CM

## 2012-11-16 DIAGNOSIS — J90 Pleural effusion, not elsewhere classified: Secondary | ICD-10-CM | POA: Insufficient documentation

## 2012-11-16 DIAGNOSIS — IMO0002 Reserved for concepts with insufficient information to code with codable children: Secondary | ICD-10-CM | POA: Insufficient documentation

## 2012-11-16 DIAGNOSIS — E785 Hyperlipidemia, unspecified: Secondary | ICD-10-CM

## 2012-11-16 DIAGNOSIS — M1990 Unspecified osteoarthritis, unspecified site: Secondary | ICD-10-CM | POA: Insufficient documentation

## 2012-11-16 DIAGNOSIS — Z950 Presence of cardiac pacemaker: Secondary | ICD-10-CM | POA: Insufficient documentation

## 2012-11-16 DIAGNOSIS — K219 Gastro-esophageal reflux disease without esophagitis: Secondary | ICD-10-CM | POA: Insufficient documentation

## 2012-11-16 DIAGNOSIS — I739 Peripheral vascular disease, unspecified: Secondary | ICD-10-CM | POA: Insufficient documentation

## 2012-11-23 ENCOUNTER — Ambulatory Visit: Payer: Medicare Other | Admitting: Internal Medicine

## 2012-11-26 ENCOUNTER — Ambulatory Visit: Payer: Medicare Other | Admitting: Interventional Cardiology

## 2012-11-27 ENCOUNTER — Encounter (HOSPITAL_COMMUNITY): Payer: Self-pay

## 2012-12-06 ENCOUNTER — Ambulatory Visit (INDEPENDENT_AMBULATORY_CARE_PROVIDER_SITE_OTHER): Payer: Medicare Other | Admitting: *Deleted

## 2012-12-06 ENCOUNTER — Ambulatory Visit (INDEPENDENT_AMBULATORY_CARE_PROVIDER_SITE_OTHER): Payer: Medicare Other | Admitting: Interventional Cardiology

## 2012-12-06 ENCOUNTER — Encounter: Payer: Self-pay | Admitting: Interventional Cardiology

## 2012-12-06 VITALS — BP 124/64 | HR 62 | Ht 62.0 in | Wt 115.0 lb

## 2012-12-06 DIAGNOSIS — I4891 Unspecified atrial fibrillation: Secondary | ICD-10-CM

## 2012-12-06 DIAGNOSIS — I059 Rheumatic mitral valve disease, unspecified: Secondary | ICD-10-CM

## 2012-12-06 DIAGNOSIS — Z950 Presence of cardiac pacemaker: Secondary | ICD-10-CM

## 2012-12-06 DIAGNOSIS — I5032 Chronic diastolic (congestive) heart failure: Secondary | ICD-10-CM

## 2012-12-06 LAB — PACEMAKER DEVICE OBSERVATION
BMOD-0002RV: 10
BRDY-0004RV: 110 {beats}/min
DEVICE MODEL PM: 2032248

## 2012-12-06 NOTE — Progress Notes (Signed)
[address] [place], [place]  [address] Phone: [phone] Fax:  [phone]  Date:  12/06/2012   ID:  Peggy Harvey, DOB [birth year], MRN [id number]  PCP:  Gweneth Dimitri, MD      History of Present Illness: Peggy Harvey is a 79 y.o. female  with COPD. SHe has felt ok. She had who had pneumonia and  A pleural effusion but was not hospitalized. She bounced back eventually. She walks some in the house. Atrial Fibrillation F/U:  c/o Leg edema in feet.  Denies : Chest pain.  Dizziness.  Orthopnea.  Palpitations.  Shortness of breath.  Syncope.    Vital Signs      Wt Readings from Last 3 Encounters:  12/06/12 115 lb (52.164 kg)  12/26/11 126 lb (57.153 kg)  05/05/11 136 lb (61.689 kg)     Past Medical History  Diagnosis Date  . Femoral artery occlusion   . Coronary artery disease   . COPD (chronic obstructive pulmonary disease)     F/B Dr. Francella Solian. On O2.  [name] Kitchen Dysphagia, oropharyngeal phase     pills have to be crushed and given with applesauce  . Arthritis   . Degenerative arthritis of spine   . Pneumonia     hx aspiraton pna  . Bleeding nose Dec. 2012  . H/O hiatal hernia   . GERD (gastroesophageal reflux disease)   . DDD (degenerative disc disease)   . Osteoarthritis   . Peripheral vascular disease   . ESBL (extended spectrum beta-lactamase) producing bacteria infection   . Constipation   . Gangrene     of the bowels  . Hyperlipidemia   . Osteomalacia, unspecified   . Essential hypertension, benign   . Cardiac pacemaker in situ     Implanted 08/05/07 PPM Generator - Manufacturer: [employer], Model # [id number] XL SR [id number], Serial # [id number]  . PAD (peripheral artery disease)   . Protein-calorie malnutrition   . Supratherapeutic INR   . Acute and chronic respiratory failure   . Unspecified pleural effusion     Interval moderate-sized right pleural fusion  . Hypokalemia   . Atrial fibrillation     On coumadin  . CHF (congestive heart  failure)     ECHO 02/15/11 EF = 55-60%, Ventricular septum showed abnormal function & dyssynergy, Moderate MV regurgitation, LA mod-severely dilated, Pulmonary systolic pressure was moderately increased. (Note mod MR and LA mod/severly enlarged, most likely cause of her R effusion)  . UTI (lower urinary tract infection)   . Pressure ulcer, buttock(707.05)     Current Outpatient Prescriptions  Medication Sig Dispense Refill  . acetaminophen (TYLENOL) 500 MG tablet Take 500 mg by mouth every 6 (six) hours as needed.      . ACETYLCYSTEINE IN Inhale 3 mLs into the lungs daily.      [name] Kitchen arformoterol (BROVANA) 15 MCG/2ML NEBU Take 2 mLs (15 mcg total) by nebulization 2 (two) times daily. DX:  496, FILE WITH MCR PART B  120 mL  6  . Ascorbic Acid (VITAMIN C) 1000 MG tablet Take 4,000 mg by mouth daily.        . bethanechol (URECHOLINE) 10 MG tablet Take 10 mg by mouth 3 (three) times daily.        . budesonide (PULMICORT) 0.5 MG/2ML nebulizer solution USE 1 VIAL IN NEBULIZER TWICE A DAY DX 496  120 mL  6  . Calcium Carbonate-Vitamin D (OS-CAL 500 + D) 500-500 MG-UNIT  CHEW Chew 1 tablet by mouth 2 (two) times daily.        . Cholecalciferol (VITAMIN D) 2000 UNITS tablet Take 2,000 Units by mouth daily.        . cloNIDine (CATAPRES-TTS-1) 0.1 mg/24hr patch Place 1 patch onto the skin once a week.      [name] Kitchen CRANBERRY EXTRACT PO Take 1 tablet by mouth 2 (two) times daily.        . digoxin (LANOXIN) 0.25 MG tablet Take 0.125 mcg by mouth every morning. Pt takes (1/2 tab) for 0.177mcg dose      . diltiazem (CARDIZEM CD) 240 MG 24 hr capsule Take 240 mg by mouth daily.      [name] Kitchen esomeprazole (NEXIUM) 40 MG capsule Take 40 mg by mouth daily before breakfast.        . fluconazole (DIFLUCAN) 150 MG tablet       . food thickener (THICK IT) POWD With water/liquids.  1 Can  0  . furosemide (LASIX) 40 MG tablet Take 80 mg by mouth every morning.       [name] Kitchen guaiFENesin (MUCINEX) 600 MG 12 hr tablet Take 600 mg by mouth 2  (two) times daily.        [name] Kitchen guaiFENesin (ROBITUSSIN) 100 MG/5ML SOLN Take 5 mLs by mouth every 4 (four) hours as needed. cough       . HYDROcodone-acetaminophen (NORCO) 5-325 MG per tablet Take 1 tablet by mouth every 6 (six) hours as needed. pain       . ipratropium (ATROVENT) 0.02 % nebulizer solution USE 1 NEBULE EVERY 6 HOURS AS NEEDED FOR WHEEZING  120 mL  2  . IRON PO Take 1 tablet by mouth daily.      [name] Kitchen levalbuterol (XOPENEX HFA) 45 MCG/ACT inhaler Inhale 1-2 puffs into the lungs every 4 (four) hours as needed.      [name] Kitchen LORazepam (ATIVAN) 0.5 MG tablet Take 0.25 mg by mouth daily.      . Multiple Vitamins-Minerals (MACULAR VITAMIN BENEFIT PO) Take 2 tablets by mouth.        . Nutritional Supplements (ESTROVEN PO) Take 1 tablet by mouth daily.        . Omega-3 Fatty Acids (FISH OIL) 1000 MG CAPS Take 1 capsule by mouth 2 (two) times daily.      . Potassium (POTASSIMIN PO) 1.5 tsp daily      . prochlorperazine (COMPAZINE) 5 MG tablet Take 5 mg by mouth every 6 (six) hours as needed.      Peggy Harvey 15 MG TABS tablet       . warfarin (COUMADIN) 1 MG tablet Take as directed      . [DISCONTINUED] Alum & Mag Hydroxide-Simeth (MAGIC MOUTHWASH W/LIDOCAINE) SOLN Take 5 mLs by mouth 3 (three) times daily as needed (Sore throat.).  100 mL  0  . [DISCONTINUED] diltiazem (CARDIZEM) 60 MG tablet Take 1 tablet (60 mg total) by mouth every 6 (six) hours.  120 tablet  0  . [DISCONTINUED] hydrochlorothiazide (HYDRODIURIL) 12.5 MG tablet Take 12.5 mg by mouth daily.        . [DISCONTINUED] promethazine (PHENERGAN) 25 MG tablet Take 25 mg by mouth every 6 (six) hours as needed. nausea        No current facility-administered medications for this visit.    Allergies:    Allergies  Allergen Reactions  . Penicillin G Sodium   . Sulfonamide Derivatives Hives  . Penicillins Itching and Rash    Social History:  The patient  reports that she quit smoking about 23 years ago. Her smoking use included Cigarettes.  She has a 25 pack-year smoking history. She has never used smokeless tobacco. She reports that she does not drink alcohol or use illicit drugs.   Family History:  The patient's family history includes Allergies in an other family member; Asthma in an other family member; CAD in her father, mother, and sister; Cancer in her brother and sister; Hodgkin's lymphoma in her brother.   ROS:  Please see the history of present illness.  No nausea, vomiting.  No fevers, chills.  No focal weakness.  No dysuria. Leg pain and bruising.   All other systems reviewed and negative.   PHYSICAL EXAM: VS:  BP 124/64  Pulse 62  Ht 5\' 2"  (1.575 m)  Wt 115 lb (52.164 kg)  BMI 21.03 kg/m2 Well nourished, well developed, in no acute distress HEENT: normal Neck: no JVD, no carotid bruits Cardiac:  normal S1, S2; RRR; 2/6/ systolic murmur Lungs:  clear to auscultation bilaterally, no wheezing, rhonchi or rales Abd: soft, nontender, no hepatomegaly Ext: no edema Skin: warm and dry Neuro:   no focal abnormalities noted  EKG:  Ventricular pacing, likely underlying atrial fibrillation    ASSESSMENT AND PLAN:  Atrial fibrillation  Continue Cardizem CD Capsule Extended Release 24 Hour, 240mg , 1 capsule, Orally, qd - Arnette Felts PA Continue Coumadin Tablet, 2 MG, 1 tablet, Orally, 2 mg daily likley underlying AFib by ECG. Low dose Xarelto for stroke prevention.  2. Essential hypertension, benign  Continue Catapres-TTS-2 Patch Weekly, 0.1 mg, one patch, Transdermal, once a week - M. Duran PA Continue Lasix Tablet, 80 MG, 1 tablet, Orally, Once a day controlled. She lost a significant amount of weight since her last visit to. She may require less medicine if weight loss continues.  3. Cardiac pacemaker in situ  continue with routine pacemaker checks.  Chronic diastolic heart failure: No CHF.  Appears euvolemic.  Mitral regurgitation: Moderate by prior echo. No sign of CHF a this time.   Signed, Fredric Mare,  MD, [facility] 12/06/2012 3:23 PM      Subjective:      Patient ID: Peggy Harvey is a 79 y.o. female.  Chief Complaint: HPI  ROS   Objective:    Physical Exam

## 2012-12-06 NOTE — Patient Instructions (Signed)
Your physician wants you to follow-up in: 1 year with Dr. Varanasi. You will receive a reminder letter in the mail two months in advance. If you don't receive a letter, please call our office to schedule the follow-up appointment.  Your physician recommends that you continue on your current medications as directed. Please refer to the Current Medication list given to you today.  

## 2012-12-06 NOTE — Progress Notes (Signed)
Pacemaker check in clinic. Normal device function. Thresholds, sensing, impedances consistent with previous measurements. Device programmed to maximize longevity.No  high ventricular rates noted. Device programmed at appropriate safety margins. Histogram distribution appropriate for patient activity level. Device programmed to optimize intrinsic conduction. Estimated longevity 8.25 years. Patient enrolled in remote follow-up/TTM's with [employer]. Plan to follow every 3 months remotely and see annually in office. Patient education completed.  ROV in 3 months with Dr. Graciela Husbands.

## 2012-12-07 ENCOUNTER — Encounter: Payer: Self-pay | Admitting: Internal Medicine

## 2012-12-07 ENCOUNTER — Ambulatory Visit (INDEPENDENT_AMBULATORY_CARE_PROVIDER_SITE_OTHER): Payer: Medicare Other | Admitting: Internal Medicine

## 2012-12-07 VITALS — BP 110/62 | HR 57 | Temp 97.2°F | Ht 62.0 in | Wt 115.0 lb

## 2012-12-07 DIAGNOSIS — J9610 Chronic respiratory failure, unspecified whether with hypoxia or hypercapnia: Secondary | ICD-10-CM

## 2012-12-07 DIAGNOSIS — J449 Chronic obstructive pulmonary disease, unspecified: Secondary | ICD-10-CM

## 2012-12-07 DIAGNOSIS — J90 Pleural effusion, not elsewhere classified: Secondary | ICD-10-CM

## 2012-12-07 MED ORDER — ALBUTEROL SULFATE HFA 108 (90 BASE) MCG/ACT IN AERS: 2.0000 | INHALATION_SPRAY | RESPIRATORY_TRACT | Status: AC | PRN

## 2012-12-07 MED ORDER — ALBUTEROL SULFATE (2.5 MG/3ML) 0.083% IN NEBU: 2.5000 mg | INHALATION_SOLUTION | Freq: Four times a day (QID) | RESPIRATORY_TRACT | Status: AC | PRN

## 2012-12-07 NOTE — Patient Instructions (Addendum)
Try off urecholine off acetylcysteine and off ipatropium to see what difference if any this makes   Maintain on brovana and budesonide every 12 hours around the clock  For cough/ use the flutter valve   For breathing when can't get comfortable at rest 1st use the puffer xopenex or albuterol up to 1-2 puffs every 4 hour 2nd if not effective then ok to use albuterol but itself in the neb up to 4hours    GERD (REFLUX)  is an extremely common cause of respiratory symptoms, many times with no significant heartburn at all.    It can be treated with medication, but also with lifestyle changes including avoidance of late meals, excessive alcohol, smoking cessation, and avoid fatty foods, chocolate, peppermint, colas, red wine, and acidic juices such as orange juice.  NO MINT OR MENTHOL PRODUCTS SO NO COUGH DROPS  USE SUGARLESS CANDY INSTEAD (jolley ranchers or Stover's)  NO OIL BASED VITAMINS - use powdered substitutes.- stop fish oil for now and just eat more fish    Please schedule a follow up office visit in 4 weeks, call sooner if needed with cxr on return

## 2012-12-07 NOTE — Assessment & Plan Note (Signed)
-   new R effusion noted 05/05/2011 > rx chronically with lasix   Most likely this is hydrostatic/ related to MV dz with severe LAE on ech 12/12012 and relatively well controlled on diuretic rx alone > no change rx.

## 2012-12-07 NOTE — Progress Notes (Signed)
Subjective:     Patient ID: Peggy Harvey, female   DOB: 01-21-1914  MRN: [id number]  HPI  89 yowf quit smoking in 1978 on 02 since Nov 2008 baseline rolling walker on 02 3 pulse x around the house most of care is thru [facility] at [facility] in [place]    .   Date of Admission: 02/21/2011  Date of Discharge: 02/24/2011  Dr. Sherene Sires, Dr. Vassie Loll - Pulmonary  Dr. Jearld Fenton - ENT  Discharge Diagnoses:  Principal Problem:  *Sore throat   HYPERTENSION  Campath-induced atrial fibrillation  C O P D  DYSPNEA  Cough  Respiratory failure, acute-on-chronic  Flu-like symptoms  Dysphagia  Weakness generalized  Hypokalemia  Pneumonia   05/05/2011 post hosp f/u ov/Hiroto Saltzman 2lpm continuous with doe  across parking lot = basline, no purulent sputum but still rattles mostly in am's able to bring up mucus with use of flutter valve but it's all white. Overall doing well.   rec Ok to use 1lpm at rest, 2lpm when you leave home, but always use 3lpm when you are exerting and noticing that you are short of breath. Please remember to go to the  x-ray department downstairs for your tests - we will call you with the results when they are available.     12/07/2012 f/u ov/Tylan Kinn re: 02 dep resp failure/ copd/ R effusion Chief Complaint  Patient presents with  . Follow-up    Breathing slightly worse SOB, cough w/ white mucus  cough esp in am but no excess or purulent sputum and able to lie flat ok 02 3lpm 24/7 but 2lpm when on portable 02  Daughter struggling with multiple instructions re how to use nebs/ hfa maint vs prns. Still on lasix 80 mg daily to control R effusion  No obvious day to day or daytime variabilty or assoc   cp or chest tightness, subjective wheeze overt sinus or hb symptoms. No unusual exp hx or h/o childhood pna/ asthma or knowledge of premature birth.  Sleeping ok without nocturnal  or early am exacerbation  of respiratory  c/o's or need for noct saba. Also denies any obvious fluctuation of  symptoms with weather or environmental changes or other aggravating or alleviating factors except as outlined above   Current Medications, Allergies, Complete Past Medical History, Past Surgical History, Family History, and Social History were reviewed in [employer] record.  ROS  The following are not active complaints unless bolded sore throat, dysphagia, dental problems, itching, sneezing,  nasal congestion or excess/ purulent secretions, ear ache,   fever, chills, sweats, unintended wt loss, pleuritic or exertional cp, hemoptysis,  orthopnea pnd or leg swelling, presyncope, palpitations, heartburn, abdominal pain, anorexia, nausea, vomiting, diarrhea  or change in bowel or urinary habits, change in stools or urine, dysuria,hematuria,  rash, arthralgias, visual complaints, headache, numbness weakness or ataxia or problems with walking or coordination,  change in mood/affect or memory.            Past Medical History:  Atrial Fibrillation  C O P D  - No PFT's in system April 28, 2008  Hypertension  Cholecystectomy  hysterectomy  appendix  degen disk disease  pneumonia- pvax in past   Past Surgical History:  gallbladder removal  hysterectomy  appendectomy  pacemaker 6/09   Family History:  Coronary Heart Disease father, mother, sister  Brother had Hodgkins  Sister breast cancer  Brother lung ca   Social History:  Patient states former smoker, quit  1978  widowed  lives with daughter  Pack-years: 1/2 pack daily 50 years          Objective:   Physical Exam Frail elderly wf lets daughter answer all the question, wheelchair, oxygen wt 152 > 146 04/27/2008 > 115 12/07/2012  HEENT: [place]/AT, EOM-wnl, PERRLA, EACs-clear, TMs-wnl, NOSE-clear, THROAT-clear & wnl.Dentures  NECK: Supple w/ fair ROM; no JVD; normal carotid impulses w/o bruits; no thyromegaly or nodules palpated; no lymphadenopathy.  CHEST: Rattling cough, decreased breath sounds   R base with  dullness HEART: RRR, no m/r/g heard, paced  Pos edema, sym ABDOMEN: Soft & nt; nml bowel sounds; no organomegaly or masses detected.  EXT: Warm bilat, no calf pain,, clubbing, pulses intact Skin: no rash/lesion   CXR  Sept 23 2014 Improving R effusion       Assessment:

## 2012-12-07 NOTE — Assessment & Plan Note (Signed)
-   05/05/2011 Desat on 2lpm waling 185 ft > resolved on 3lpm with sats ok at rest on 1 lpm  Chronic rx = 3lpm as of 12/07/2012

## 2012-12-07 NOTE — Assessment & Plan Note (Signed)
I had an extended discussion with the patient today lasting 15 to 20 minutes of a 25 minute visit on the following issues:   Confusion with nebs/ hfa maint vs prn's understandable given complexity of rx and note she's on anticholinergics, cholinergics (urecholine) contraindicated in copd/ab and working against her brovana so would benefit from a simplified regimen where less may prove to be more.    Each maintenance medication was reviewed in detail including most importantly the difference between maintenance and as needed and under what circumstances the prns are to be used.  Please see instructions for details which were reviewed in writing and the patient given a copy.

## 2012-12-21 ENCOUNTER — Encounter: Payer: Self-pay | Admitting: Internal Medicine

## 2013-01-05 ENCOUNTER — Encounter (INDEPENDENT_AMBULATORY_CARE_PROVIDER_SITE_OTHER): Payer: Self-pay

## 2013-01-05 ENCOUNTER — Encounter: Payer: Self-pay | Admitting: Internal Medicine

## 2013-01-05 ENCOUNTER — Ambulatory Visit (INDEPENDENT_AMBULATORY_CARE_PROVIDER_SITE_OTHER)
Admission: RE | Admit: 2013-01-05 | Discharge: 2013-01-05 | Disposition: A | Discharge status: Home / Self Care | Payer: Medicare Other | Source: Ambulatory Visit | Attending: Internal Medicine | Admitting: Internal Medicine

## 2013-01-05 ENCOUNTER — Ambulatory Visit (INDEPENDENT_AMBULATORY_CARE_PROVIDER_SITE_OTHER): Payer: Medicare Other | Admitting: Internal Medicine

## 2013-01-05 VITALS — BP 110/74 | HR 51 | Temp 97.7°F | Ht 62.0 in | Wt 112.0 lb

## 2013-01-05 DIAGNOSIS — J9610 Chronic respiratory failure, unspecified whether with hypoxia or hypercapnia: Secondary | ICD-10-CM

## 2013-01-05 DIAGNOSIS — J90 Pleural effusion, not elsewhere classified: Secondary | ICD-10-CM

## 2013-01-05 NOTE — Assessment & Plan Note (Signed)
-   new R effusion noted 05/05/2011 >Rx diuresis  No change since previous study and relatively stable x 1.5 years on diuretics so almost certainly a chronic hydrostatic effusion with passive atx RLL and chronic congested cough on that basis   Rec Keep on dry side/ use flutter/mucinex for cough  F/u  In 3 months

## 2013-01-05 NOTE — Progress Notes (Signed)
Subjective:     Patient ID: Peggy Harvey, female   DOB: 10/27/1913  MRN: [id number]  HPI  59 yowf quit smoking in 1978 on 02 since Nov 2008 baseline rolling walker on 02 3 pulse x around the house most of care is thru [facility] at [place] in [place]    .   Date of Admission: 02/21/2011  Date of Discharge: 02/24/2011  Dr. Sherene Sires, Dr. Vassie Loll - Pulmonary  Dr. Jearld Fenton - ENT  Discharge Diagnoses:  Principal Problem:  *Sore throat   HYPERTENSION  Campath-induced atrial fibrillation  C O P D  DYSPNEA  Cough  Respiratory failure, acute-on-chronic  Flu-like symptoms  Dysphagia  Weakness generalized  Hypokalemia  Pneumonia   05/05/2011 post hosp f/u ov/Wert 2lpm continuous with doe  across parking lot = basline, no purulent sputum but still rattles mostly in am's able to bring up mucus with use of flutter valve but it's all white. Overall doing well.   rec Ok to use 1lpm at rest, 2lpm when you leave home, but always use 3lpm when you are exerting and noticing that you are short of breath.    12/07/2012 f/u ov/Wert re: 02 dep resp failure/ copd/ R effusion Chief Complaint  Patient presents with  . Follow-up    Breathing slightly worse SOB, cough w/ white mucus  cough esp in am but no excess or purulent sputum and able to lie flat ok 02 3lpm 24/7 but 2lpm when on portable 02  Daughter struggling with multiple instructions re how to use nebs/ hfa maint vs prns. Still on lasix 80 mg daily to control R effusion rec Try off urecholine off acetylcysteine and off ipatropium to see what difference if any this makes  Maintain on brovana and budesonide every 12 hours around the clock For cough/ use the flutter valve  For breathing when can't get comfortable at rest 1st use the puffer xopenex or albuterol up to 1-2 puffs every 4 hour 2nd if not effective then ok to use albuterol but itself in the neb up to 4hours    01/05/2013 f/u ov/Wert re: copd/ R effusion Chief Complaint  Patient  presents with  . Follow-up    CXR done today.  Still having SOB w/ exertion with some improvement  On 3lpm 24/7 Sleeps on 2 pillows s difficulty, has flutter valve ? Using  ("two different colors") Cough better p mucinex > white mucus only  Questions about how much extra fluid to push to prevent dehydration on lasix   No obvious day to day or daytime variabilty or assoc  cp or chest tightness, subjective wheeze overt sinus or hb symptoms. No unusual exp hx or h/o childhood pna/ asthma or knowledge of premature birth.  Sleeping ok without nocturnal  or early am exacerbation  of respiratory  c/o's or need for noct saba. Also denies any obvious fluctuation of symptoms with weather or environmental changes or other aggravating or alleviating factors except as outlined above   Current Medications, Allergies, Complete Past Medical History, Past Surgical History, Family History, and Social History were reviewed in [employer] record.  ROS  The following are not active complaints unless bolded sore throat, dysphagia, dental problems, itching, sneezing,  nasal congestion or excess/ purulent secretions, ear ache,   fever, chills, sweats, unintended wt loss, pleuritic or exertional cp, hemoptysis,  orthopnea pnd or leg swelling, presyncope, palpitations, heartburn, abdominal pain, anorexia, nausea, vomiting, diarrhea  or change in bowel or urinary habits,  change in stools or urine, dysuria,hematuria,  rash, arthralgias, visual complaints, headache, numbness weakness or ataxia or problems with walking or coordination,  change in mood/affect or memory.            Past Medical History:  Atrial Fibrillation  C O P D  - No PFT's in system April 28, 2008  Hypertension  Cholecystectomy  hysterectomy  appendix  degen disk disease  pneumonia- pvax in past   Past Surgical History:  gallbladder removal  hysterectomy  appendectomy  pacemaker 6/09   Family History:  Coronary  Heart Disease father, mother, sister  Brother had Hodgkins  Sister breast cancer  Brother lung ca   Social History:  Patient states former smoker, quit 1978  widowed  lives with daughter  Pack-years: 1/2 pack daily 50 years          Objective:   Physical Exam Frail elderly wf lets daughter answer all the question, wheelchair, oxygen wt 152 > 146 04/27/2008 > 115 12/07/2012  Congested cough  HEENT: [place]/AT, EOM-wnl, PERRLA, EACs-clear, TMs-wnl, NOSE-clear, THROAT-clear & wnl.Dentures  NECK: Supple w/ fair ROM; no JVD; normal carotid impulses w/o bruits; no thyromegaly or nodules palpated; no lymphadenopathy.  CHEST: Rattling cough, decreased breath sounds   R base with dullness HEART: RRR, no m/r/g heard, paced  Pos edema, sym ABDOMEN: Soft & nt; nml bowel sounds; no organomegaly or masses detected.  EXT: Warm bilat, no calf pain,, clubbing, pulses intact Skin: no rash/lesion     CXR  01/05/2013 :   Stable right-sided pleural effusion. Left basilar opacity is noted concerning for subsegmental atelectasis or scarring.       Assessment:

## 2013-01-05 NOTE — Assessment & Plan Note (Signed)
-   05/05/2011 Desat on 2lpm waling 185 ft > resolved on 3lpm with sats ok at rest on 1 lpm  Chronic rx = 3lpm as of 12/07/2012   Adequate control on present rx, reviewed > no change in rx needed

## 2013-01-05 NOTE — Patient Instructions (Signed)
Keep using the lasix to maintain a low fluid level and avoid excess fluid intake  Use flutter valve as much as possible  For cough either use mucinex or delsym  (alternative is just use mucinex dm up to 1200 mg every 12 hours)  Please schedule a follow up visit in 3 months but call sooner if needed for cxr on return

## 2013-03-09 ENCOUNTER — Encounter: Payer: Self-pay | Admitting: Internal Medicine

## 2013-03-09 DIAGNOSIS — I495 Sick sinus syndrome: Secondary | ICD-10-CM

## 2013-03-16 ENCOUNTER — Telehealth: Payer: Self-pay | Admitting: Internal Medicine

## 2013-03-16 NOTE — Telephone Encounter (Signed)
Filled out prior authorization form and faxed to [facility].  Peggy Harvey is aware, will watch for approval as it arrives.

## 2013-03-21 NOTE — Telephone Encounter (Signed)
I called and made daughter aware. She reports she paid for $147 out of pocket. She does not want to bring pt in right now. Pt has medication and will call once she runs out of the medication she will call for appt. Nothing needed

## 2013-03-21 NOTE — Telephone Encounter (Signed)
Ok to try off budesonide x 2 weeks then regroup either to see me or Tammy NP

## 2013-03-21 NOTE — Telephone Encounter (Signed)
The daughter is calling stating that this has been denied and an appeal is needed goes to [id number] attn admistrative appeals department   Member id [id number].  This needs to state that its medically ness.  [phone] brenda peaden

## 2013-03-21 NOTE — Telephone Encounter (Signed)
Dr Sherene SiresWert, would you like to proceed with the appeals process for budesonide?

## 2013-03-22 ENCOUNTER — Encounter: Payer: Medicare Other | Admitting: Internal Medicine

## 2013-04-08 ENCOUNTER — Encounter: Payer: Self-pay | Admitting: Internal Medicine

## 2013-04-12 ENCOUNTER — Other Ambulatory Visit: Payer: Self-pay | Admitting: Internal Medicine

## 2013-04-25 ENCOUNTER — Other Ambulatory Visit: Payer: Self-pay | Admitting: Internal Medicine

## 2013-04-25 ENCOUNTER — Telehealth: Payer: Self-pay | Admitting: Internal Medicine

## 2013-04-25 MED ORDER — BUDESONIDE 0.5 MG/2ML IN SUSP: 0.5000 mg | Freq: Two times a day (BID) | RESPIRATORY_TRACT | Status: DC

## 2013-04-25 NOTE — Telephone Encounter (Signed)
Rx has been sent in with dx code. Pt's daughter is aware. Nothing further is needed.

## 2013-05-19 ENCOUNTER — Encounter: Payer: Medicare Other | Admitting: Internal Medicine

## 2013-05-24 ENCOUNTER — Encounter: Payer: Medicare Other | Admitting: Internal Medicine

## 2013-06-07 ENCOUNTER — Telehealth: Payer: Self-pay | Admitting: Internal Medicine

## 2013-06-07 NOTE — Telephone Encounter (Signed)
Pt's daughter called just to inform us to look out for paperwork from [employer] for her mother's  Brovana. Advised her would [facility] to [facility] for these forms.  Verlon AuLeslie have you seen anything from [employer] on this pt?

## 2013-06-07 NOTE — Telephone Encounter (Signed)
No, I have not seen this, sorry

## 2013-06-07 NOTE — Telephone Encounter (Signed)
Spoke with [employer]. Was advised pt is with [facility] not [employer].  Spoke with [name] pt daughter. She reports she received a call from [employer] this AM and also it has [employer] on pt medication boxes. She gave me a # to call [phone]. I called and no one answered.  I called [facility] and spoke with Selena BattenKim. Was advised she will look into this and let us know when she comes in this afternoon.

## 2013-06-07 NOTE — Telephone Encounter (Signed)
Spoke with Selena BattenKim. She reports they do not need anything on pt currently. They found current RX for neb. Medications are being sent to pt. Risa GrillKim LMTCB w/ family to make aware.  Called daughter as well and LMTCB x1 to make aware everything taken care of and medications are being sent to them

## 2013-06-08 NOTE — Telephone Encounter (Signed)
Daughter aware  Nothing further needed  

## 2013-06-09 DIAGNOSIS — I495 Sick sinus syndrome: Secondary | ICD-10-CM

## 2013-06-21 ENCOUNTER — Telehealth: Payer: Self-pay | Admitting: Internal Medicine

## 2013-06-21 NOTE — Telephone Encounter (Signed)
Just sent a remote on 06/06/13, and mother hasn't been out of the house since last year. Discussed with Belenda CruiseKristin in device who is going to call the patient.

## 2013-06-21 NOTE — Telephone Encounter (Signed)
New Message  Daughter called requests a call back to discuss if the Phy pacer appt is needed. Please call back to discuss//SR

## 2013-06-22 NOTE — Telephone Encounter (Signed)
Pt to have yearly checks at home. Unable to come in for office checks. [employer] checks every 3 months.

## 2013-06-24 ENCOUNTER — Encounter: Payer: Medicare Other | Admitting: Internal Medicine

## 2013-06-30 ENCOUNTER — Telehealth: Payer: Self-pay | Admitting: Internal Medicine

## 2013-06-30 NOTE — Telephone Encounter (Signed)
New message           Pt daughter is calling for Peggy Harvey to see when she will be able to stop by to check her mother's device. Please give brenda a call.

## 2013-07-01 ENCOUNTER — Ambulatory Visit (INDEPENDENT_AMBULATORY_CARE_PROVIDER_SITE_OTHER): Payer: Medicare Other | Admitting: *Deleted

## 2013-07-01 DIAGNOSIS — I4891 Unspecified atrial fibrillation: Secondary | ICD-10-CM

## 2013-07-01 LAB — MDC_IDC_ENUM_SESS_TYPE_INCLINIC
Battery Impedance: 1000 Ohm — CL
Battery Voltage: 2.78 V
Date Time Interrogation Session: 20150508214904
Implantable Pulse Generator Serial Number: 2032248
Lead Channel Pacing Threshold Amplitude: 0.5 V
Lead Channel Pacing Threshold Pulse Width: 0.4 ms
Lead Channel Setting Pacing Pulse Width: 0.4 ms
Lead Channel Setting Sensing Sensitivity: 2 mV
MDC IDC MSMT LEADCHNL RV IMPEDANCE VALUE: 422 Ohm
MDC IDC STAT BRADY RV PERCENT PACED: 99 %

## 2013-07-01 NOTE — Telephone Encounter (Signed)
Pt to be checked at home today by Donalynn FurlongKristin Treson Laura

## 2013-07-11 NOTE — Progress Notes (Signed)
Pacemaker check in clinic. Normal device function. Thresholds, sensing, impedances consistent with previous measurements. Device programmed to maximize longevity. No high ventricular rates noted. Device programmed at appropriate safety margins. Histogram distribution appropriate for patient activity level. Device programmed to optimize intrinsic conduction. Estimated longevity 7.50 to 9.25 years. ROV in 12 mths for device check. (pt may need to be checked at home).

## 2013-07-12 ENCOUNTER — Encounter: Payer: Self-pay | Admitting: Internal Medicine

## 2013-07-18 ENCOUNTER — Emergency Department (HOSPITAL_BASED_OUTPATIENT_CLINIC_OR_DEPARTMENT_OTHER): Payer: Medicare Other

## 2013-07-18 ENCOUNTER — Inpatient Hospital Stay (HOSPITAL_BASED_OUTPATIENT_CLINIC_OR_DEPARTMENT_OTHER)
Admission: EM | Admit: 2013-07-18 | Discharge: 2013-07-20 | DRG: 871 | Disposition: A | Discharge status: Home Health | Payer: Medicare Other | Attending: Internal Medicine | Admitting: Internal Medicine

## 2013-07-18 ENCOUNTER — Encounter (HOSPITAL_BASED_OUTPATIENT_CLINIC_OR_DEPARTMENT_OTHER): Payer: Self-pay | Admitting: Emergency Medicine

## 2013-07-18 DIAGNOSIS — Z9089 Acquired absence of other organs: Secondary | ICD-10-CM

## 2013-07-18 DIAGNOSIS — J449 Chronic obstructive pulmonary disease, unspecified: Secondary | ICD-10-CM | POA: Diagnosis present

## 2013-07-18 DIAGNOSIS — R6889 Other general symptoms and signs: Secondary | ICD-10-CM

## 2013-07-18 DIAGNOSIS — I4891 Unspecified atrial fibrillation: Secondary | ICD-10-CM

## 2013-07-18 DIAGNOSIS — J4489 Other specified chronic obstructive pulmonary disease: Secondary | ICD-10-CM | POA: Diagnosis present

## 2013-07-18 DIAGNOSIS — J9600 Acute respiratory failure, unspecified whether with hypoxia or hypercapnia: Secondary | ICD-10-CM | POA: Diagnosis present

## 2013-07-18 DIAGNOSIS — E43 Unspecified severe protein-calorie malnutrition: Secondary | ICD-10-CM | POA: Diagnosis present

## 2013-07-18 DIAGNOSIS — I059 Rheumatic mitral valve disease, unspecified: Secondary | ICD-10-CM

## 2013-07-18 DIAGNOSIS — K219 Gastro-esophageal reflux disease without esophagitis: Secondary | ICD-10-CM

## 2013-07-18 DIAGNOSIS — Z880 Allergy status to penicillin: Secondary | ICD-10-CM

## 2013-07-18 DIAGNOSIS — J189 Pneumonia, unspecified organism: Secondary | ICD-10-CM

## 2013-07-18 DIAGNOSIS — E876 Hypokalemia: Secondary | ICD-10-CM

## 2013-07-18 DIAGNOSIS — Z8249 Family history of ischemic heart disease and other diseases of the circulatory system: Secondary | ICD-10-CM

## 2013-07-18 DIAGNOSIS — Z9981 Dependence on supplemental oxygen: Secondary | ICD-10-CM

## 2013-07-18 DIAGNOSIS — Z79899 Other long term (current) drug therapy: Secondary | ICD-10-CM

## 2013-07-18 DIAGNOSIS — I739 Peripheral vascular disease, unspecified: Secondary | ICD-10-CM | POA: Diagnosis present

## 2013-07-18 DIAGNOSIS — E785 Hyperlipidemia, unspecified: Secondary | ICD-10-CM | POA: Diagnosis present

## 2013-07-18 DIAGNOSIS — IMO0002 Reserved for concepts with insufficient information to code with codable children: Secondary | ICD-10-CM

## 2013-07-18 DIAGNOSIS — Z9849 Cataract extraction status, unspecified eye: Secondary | ICD-10-CM

## 2013-07-18 DIAGNOSIS — I10 Essential (primary) hypertension: Secondary | ICD-10-CM | POA: Diagnosis present

## 2013-07-18 DIAGNOSIS — R05 Cough: Secondary | ICD-10-CM

## 2013-07-18 DIAGNOSIS — A419 Sepsis, unspecified organism: Principal | ICD-10-CM | POA: Diagnosis present

## 2013-07-18 DIAGNOSIS — M1990 Unspecified osteoarthritis, unspecified site: Secondary | ICD-10-CM

## 2013-07-18 DIAGNOSIS — Z950 Presence of cardiac pacemaker: Secondary | ICD-10-CM

## 2013-07-18 DIAGNOSIS — I2510 Atherosclerotic heart disease of native coronary artery without angina pectoris: Secondary | ICD-10-CM | POA: Diagnosis present

## 2013-07-18 DIAGNOSIS — J9610 Chronic respiratory failure, unspecified whether with hypoxia or hypercapnia: Secondary | ICD-10-CM

## 2013-07-18 DIAGNOSIS — I509 Heart failure, unspecified: Secondary | ICD-10-CM | POA: Diagnosis present

## 2013-07-18 DIAGNOSIS — R059 Cough, unspecified: Secondary | ICD-10-CM

## 2013-07-18 DIAGNOSIS — J90 Pleural effusion, not elsewhere classified: Secondary | ICD-10-CM

## 2013-07-18 DIAGNOSIS — Z87891 Personal history of nicotine dependence: Secondary | ICD-10-CM

## 2013-07-18 DIAGNOSIS — Z681 Body mass index (BMI) 19 or less, adult: Secondary | ICD-10-CM

## 2013-07-18 DIAGNOSIS — R1310 Dysphagia, unspecified: Secondary | ICD-10-CM

## 2013-07-18 DIAGNOSIS — I5032 Chronic diastolic (congestive) heart failure: Secondary | ICD-10-CM

## 2013-07-18 DIAGNOSIS — Z807 Family history of other malignant neoplasms of lymphoid, hematopoietic and related tissues: Secondary | ICD-10-CM

## 2013-07-18 LAB — COMPREHENSIVE METABOLIC PANEL
ALBUMIN: 3.5 g/dL (ref 3.5–5.2)
ALT: 9 U/L (ref 0–35)
AST: 25 U/L (ref 0–37)
Alkaline Phosphatase: 86 U/L (ref 39–117)
BUN: 28 mg/dL — AB (ref 6–23)
CO2: 29 mEq/L (ref 19–32)
CREATININE: 0.8 mg/dL (ref 0.50–1.10)
Calcium: 9.5 mg/dL (ref 8.4–10.5)
Chloride: 97 mEq/L (ref 96–112)
GFR calc Af Amer: 69 mL/min — ABNORMAL LOW (ref >90)
GFR calc non Af Amer: 60 mL/min — ABNORMAL LOW (ref >90)
Glucose, Bld: 208 mg/dL — ABNORMAL HIGH (ref 70–99)
Potassium: 5 mEq/L (ref 3.7–5.3)
Sodium: 142 mEq/L (ref 137–147)
TOTAL PROTEIN: 7 g/dL (ref 6.0–8.3)
Total Bilirubin: 0.6 mg/dL (ref 0.3–1.2)

## 2013-07-18 LAB — CBC WITH DIFFERENTIAL/PLATELET
BASOS PCT: 0 % (ref 0–1)
Basophils Absolute: 0 10*3/uL (ref 0.0–0.1)
EOS ABS: 0 10*3/uL (ref 0.0–0.7)
EOS PCT: 0 % (ref 0–5)
HEMATOCRIT: 36 % (ref 36.0–46.0)
HEMOGLOBIN: 11.9 g/dL — AB (ref 12.0–15.0)
Lymphocytes Relative: 3 % — ABNORMAL LOW (ref 12–46)
Lymphs Abs: 0.4 10*3/uL — ABNORMAL LOW (ref 0.7–4.0)
MCH: 30.2 pg (ref 26.0–34.0)
MCHC: 33.1 g/dL (ref 30.0–36.0)
MCV: 91.4 fL (ref 78.0–100.0)
MONO ABS: 1 10*3/uL (ref 0.1–1.0)
MONOS PCT: 6 % (ref 3–12)
Neutro Abs: 14.6 10*3/uL — ABNORMAL HIGH (ref 1.7–7.7)
Neutrophils Relative %: 91 % — ABNORMAL HIGH (ref 43–77)
Platelets: 224 10*3/uL (ref 150–400)
RBC: 3.94 MIL/uL (ref 3.87–5.11)
RDW: 15 % (ref 11.5–15.5)
WBC: 16.1 10*3/uL — ABNORMAL HIGH (ref 4.0–10.5)

## 2013-07-18 LAB — URINALYSIS, ROUTINE W REFLEX MICROSCOPIC
Bilirubin Urine: NEGATIVE
GLUCOSE, UA: NEGATIVE mg/dL
Hgb urine dipstick: NEGATIVE
Ketones, ur: NEGATIVE mg/dL
LEUKOCYTES UA: NEGATIVE
NITRITE: NEGATIVE
PH: 6.5 (ref 5.0–8.0)
PROTEIN: NEGATIVE mg/dL
Specific Gravity, Urine: 1.015 (ref 1.005–1.030)
Urobilinogen, UA: 0.2 mg/dL (ref 0.0–1.0)

## 2013-07-18 LAB — DIGOXIN LEVEL: DIGOXIN LVL: 1.3 ng/mL (ref 0.8–2.0)

## 2013-07-18 LAB — I-STAT CG4 LACTIC ACID, ED: LACTIC ACID, VENOUS: 1.77 mmol/L (ref 0.5–2.2)

## 2013-07-18 MED ORDER — RESOURCE THICKENUP CLEAR PO POWD
ORAL | Status: DC | PRN
  Administered 2013-07-19 (×2): via ORAL
  Filled 2013-07-18: qty 125

## 2013-07-18 MED ORDER — BUDESONIDE 0.5 MG/2ML IN SUSP
0.5000 mg | Freq: Two times a day (BID) | RESPIRATORY_TRACT | Status: DC
  Administered 2013-07-18 – 2013-07-20 (×4): 0.5 mg via RESPIRATORY_TRACT
  Filled 2013-07-18 (×9): qty 2

## 2013-07-18 MED ORDER — HYDROCODONE-ACETAMINOPHEN 5-325 MG PO TABS
1.0000 | ORAL_TABLET | Freq: Four times a day (QID) | ORAL | Status: DC | PRN
  Administered 2013-07-20: 1 via ORAL
  Filled 2013-07-18: qty 1

## 2013-07-18 MED ORDER — GUAIFENESIN 100 MG/5ML PO SOLN
15.0000 mL | Freq: Four times a day (QID) | ORAL | Status: DC
  Administered 2013-07-19 – 2013-07-20 (×6): 300 mg via ORAL
  Filled 2013-07-18 (×10): qty 15

## 2013-07-18 MED ORDER — VANCOMYCIN HCL IN DEXTROSE 1-5 GM/200ML-% IV SOLN
1000.0000 mg | Freq: Once | INTRAVENOUS | Status: AC
  Administered 2013-07-18: 1000 mg via INTRAVENOUS
  Filled 2013-07-18: qty 200

## 2013-07-18 MED ORDER — ALBUTEROL SULFATE (2.5 MG/3ML) 0.083% IN NEBU: 2.5000 mg | INHALATION_SOLUTION | Freq: Four times a day (QID) | RESPIRATORY_TRACT | Status: DC | PRN

## 2013-07-18 MED ORDER — LEVOFLOXACIN IN D5W 500 MG/100ML IV SOLN
500.0000 mg | INTRAVENOUS | Status: DC
  Administered 2013-07-18 – 2013-07-19 (×2): 500 mg via INTRAVENOUS
  Filled 2013-07-18: qty 100

## 2013-07-18 MED ORDER — DIGOXIN 125 MCG PO TABS
0.1250 mg | ORAL_TABLET | Freq: Every day | ORAL | Status: DC
  Filled 2013-07-18: qty 1

## 2013-07-18 MED ORDER — DILTIAZEM HCL ER COATED BEADS 120 MG PO CP24
120.0000 mg | ORAL_CAPSULE | Freq: Every day | ORAL | Status: DC
  Administered 2013-07-19 – 2013-07-20 (×2): 120 mg via ORAL
  Filled 2013-07-18 (×2): qty 1

## 2013-07-18 MED ORDER — FUROSEMIDE 80 MG PO TABS
80.0000 mg | ORAL_TABLET | Freq: Every day | ORAL | Status: DC
  Filled 2013-07-18: qty 1

## 2013-07-18 MED ORDER — PANTOPRAZOLE SODIUM 40 MG PO TBEC
40.0000 mg | DELAYED_RELEASE_TABLET | Freq: Every day | ORAL | Status: DC
  Administered 2013-07-19 – 2013-07-20 (×2): 40 mg via ORAL
  Filled 2013-07-18 (×3): qty 1

## 2013-07-18 MED ORDER — RIVAROXABAN 10 MG PO TABS
10.0000 mg | ORAL_TABLET | Freq: Every day | ORAL | Status: DC
  Administered 2013-07-19 – 2013-07-20 (×3): 10 mg via ORAL
  Filled 2013-07-18 (×3): qty 1

## 2013-07-18 MED ORDER — VANCOMYCIN HCL IN DEXTROSE 750-5 MG/150ML-% IV SOLN
750.0000 mg | INTRAVENOUS | Status: DC
  Filled 2013-07-18: qty 150

## 2013-07-18 MED ORDER — PROCHLORPERAZINE MALEATE 5 MG PO TABS
5.0000 mg | ORAL_TABLET | Freq: Four times a day (QID) | ORAL | Status: DC | PRN
  Administered 2013-07-19 (×2): 5 mg via ORAL
  Filled 2013-07-18 (×2): qty 1

## 2013-07-18 MED ORDER — ACETAMINOPHEN 325 MG PO TABS
650.0000 mg | ORAL_TABLET | Freq: Once | ORAL | Status: AC
  Administered 2013-07-18: 650 mg via ORAL
  Filled 2013-07-18: qty 2

## 2013-07-18 MED ORDER — LORAZEPAM 0.5 MG PO TABS
0.2500 mg | ORAL_TABLET | Freq: Every day | ORAL | Status: DC
  Filled 2013-07-18: qty 1

## 2013-07-18 MED ORDER — ARFORMOTEROL TARTRATE 15 MCG/2ML IN NEBU
15.0000 ug | INHALATION_SOLUTION | Freq: Two times a day (BID) | RESPIRATORY_TRACT | Status: DC
  Administered 2013-07-18: 15 ug via RESPIRATORY_TRACT
  Filled 2013-07-18 (×5): qty 2

## 2013-07-18 MED ORDER — CLONIDINE HCL 0.1 MG/24HR TD PTWK
0.1000 mg | MEDICATED_PATCH | TRANSDERMAL | Status: DC
  Filled 2013-07-18: qty 1

## 2013-07-18 MED ORDER — GUAIFENESIN ER 600 MG PO TB12
600.0000 mg | ORAL_TABLET | Freq: Two times a day (BID) | ORAL | Status: DC
  Filled 2013-07-18 (×3): qty 1

## 2013-07-18 MED ORDER — LEVALBUTEROL TARTRATE 45 MCG/ACT IN AERO: 2.0000 | INHALATION_SPRAY | Freq: Four times a day (QID) | RESPIRATORY_TRACT | Status: DC | PRN

## 2013-07-18 MED ORDER — LEVOFLOXACIN IN D5W 750 MG/150ML IV SOLN
750.0000 mg | Freq: Once | INTRAVENOUS | Status: AC
  Administered 2013-07-18: 750 mg via INTRAVENOUS
  Filled 2013-07-18: qty 150

## 2013-07-18 NOTE — Progress Notes (Signed)
Spoke to [employer] and received report.

## 2013-07-18 NOTE — Plan of Care (Signed)
80 yo F requests transfer to [facility].  Being admitted for CAP, also has severe COPD at baseline with 3L O2.  No increased O2 requirement but feeling weak and is febrile with WBC.

## 2013-07-18 NOTE — H&P (Signed)
PCP: MCNEILL,WENDY, MD    Chief Complaint:  Not feeling well  HPI: Peggy Harvey is a 80 y.o. female   has a past medical history of Femoral artery occlusion; Coronary artery disease; COPD (chronic obstructive pulmonary disease); Dysphagia, oropharyngeal phase; Arthritis; Degenerative arthritis of spine; Pneumonia; Bleeding nose (Dec. 2012); H/O hiatal hernia; GERD (gastroesophageal reflux disease); DDD (degenerative disc disease); Osteoarthritis; Peripheral vascular disease; ESBL (extended spectrum beta-lactamase) producing bacteria infection; Constipation; Gangrene; Hyperlipidemia; Osteomalacia, unspecified; Essential hypertension, benign; Cardiac pacemaker in situ; PAD (peripheral artery disease); Protein-calorie malnutrition; Supratherapeutic INR; Acute and chronic respiratory failure; Unspecified pleural effusion; Hypokalemia; Atrial fibrillation; CHF (congestive heart failure); UTI (lower urinary tract infection); and Pressure ulcer, buttock(707.05).   Presented with  Progressive declineover past 1 year. She has not been [employer] well and has los  30 lb over past 1 year. She has hx of dysphagia and of thickened fluids pudding consistency. Family states since she got up she was lightheaded and not feeling well. Family brought her to [facility] and was found to be febrile with CXR worrisome for infiltrate. Patietn is on home O2 at 3L due to hx of COPD.   Hospitalist was called for admission for CAP  Review of Systems:    Pertinent positives include: Fevers, , fatigue, weight loss, productive cough,   Constitutional:  No weight loss, night sweats,   HEENT:  No headaches, Difficulty swallowing,Tooth/dental problems,Sore throat,  No sneezing, itching, ear ache, nasal congestion, post nasal drip,  Cardio-vascular:  No chest pain, Orthopnea, PND, anasarca, dizziness, palpitations.no Bilateral lower extremity swelling  GI:  No heartburn, indigestion, abdominal pain, nausea, vomiting,  diarrhea, change in bowel habits, loss of appetite, melena, blood in stool, hematemesis Resp:  no shortness of breath at rest. No dyspnea on exertion, No excess mucus, noNo non-productive cough, No coughing up of blood.No change in color of mucus.No wheezing. Skin:  no rash or lesions. No jaundice GU:  no dysuria, change in color of urine, no urgency or frequency. No straining to urinate.  No flank pain.  Musculoskeletal:  No joint pain or no joint swelling. No decreased range of motion. No back pain.  Psych:  No change in mood or affect. No depression or anxiety. No memory loss.  Neuro: no localizing neurological complaints, no tingling, no weakness, no double vision, no gait abnormality, no slurred speech, no confusion  Otherwise ROS are negative except for above, 10 systems were reviewed  Past Medical History: Past Medical History  Diagnosis Date  . Femoral artery occlusion   . Coronary artery disease   . COPD (chronic obstructive pulmonary disease)     F/B Dr. Francella Solian. On O2.  [name] Kitchen Dysphagia, oropharyngeal phase     pills have to be crushed and given with applesauce  . Arthritis   . Degenerative arthritis of spine   . Pneumonia     hx aspiraton pna  . Bleeding nose Dec. 2012  . H/O hiatal hernia   . GERD (gastroesophageal reflux disease)   . DDD (degenerative disc disease)   . Osteoarthritis   . Peripheral vascular disease   . ESBL (extended spectrum beta-lactamase) producing bacteria infection   . Constipation   . Gangrene     of the bowels  . Hyperlipidemia   . Osteomalacia, unspecified   . Essential hypertension, benign   . Cardiac pacemaker in situ     Implanted 08/05/07 PPM Generator - Manufacturer: [employer], Model # [id number] XL SR [id number], Serial # [id number]  .  PAD (peripheral artery disease)   . Protein-calorie malnutrition   . Supratherapeutic INR   . Acute and chronic respiratory failure   . Unspecified pleural effusion     Interval moderate-sized right pleural  fusion  . Hypokalemia   . Atrial fibrillation     On coumadin  . CHF (congestive heart failure)     ECHO 02/15/11 EF = 55-60%, Ventricular septum showed abnormal function & dyssynergy, Moderate MV regurgitation, LA mod-severely dilated, Pulmonary systolic pressure was moderately increased. (Note mod MR and LA mod/severly enlarged, most likely cause of her R effusion)  . UTI (lower urinary tract infection)   . Pressure ulcer, buttock(707.05)    Past Surgical History  Procedure Laterality Date  . Abdominal hysterectomy      In her 30's. Ovaries left due to DUB  . Appendectomy    . Stomach surgery  1940's    stomach surgery due to gangrene  . Cholecystectomy    . Cataract extraction, bilateral    . Permanent pacemaker insertion  08/05/07    PPM Generator - Manufacturer: [employer], Model # [id number] XL SR [id number], Serial # [id number]     Medications: Prior to Admission medications   Medication Sig Start Date End Date Taking? Authorizing Provider  levalbuterol [facility] HFA) 45 MCG/ACT inhaler Inhale into the lungs every 4 (four) hours as needed for wheezing.   Yes Historical Provider, MD  acetaminophen (TYLENOL) 500 MG tablet Take 500 mg by mouth every 6 (six) hours as needed.    Historical Provider, MD  albuterol (PROAIR HFA) 108 (90 BASE) MCG/ACT inhaler Inhale 2 puffs into the lungs every 4 (four) hours as needed for wheezing. 2 puffs every 4 hours as needed only  if your can't catch your breath 12/07/12   Nyoka Cowden, MD  albuterol (PROVENTIL) (2.5 MG/3ML) 0.083% nebulizer solution Take 3 mLs (2.5 mg total) by nebulization every 6 (six) hours as needed for wheezing. 12/07/12   Nyoka Cowden, MD  arformoterol (BROVANA) 15 MCG/2ML NEBU Take 2 mLs (15 mcg total) by nebulization 2 (two) times daily. DX:  496, FILE WITH MCR PART B 03/03/11   Nyoka Cowden, MD  Ascorbic Acid (VITAMIN C) 1000 MG tablet Take 4,000 mg by mouth daily.      Historical Provider, MD  budesonide (PULMICORT) 0.5 MG/2ML  nebulizer solution Take 2 mLs (0.5 mg total) by nebulization 2 (two) times daily. 04/25/13   Nyoka Cowden, MD  Calcium Carbonate-Vitamin D (OS-CAL 500 + D) 500-500 MG-UNIT CHEW Chew 1 tablet by mouth 2 (two) times daily.      Historical Provider, MD  Cholecalciferol (VITAMIN D) 2000 UNITS tablet Take 2,000 Units by mouth daily.      Historical Provider, MD  cloNIDine (CATAPRES-TTS-1) 0.1 mg/24hr patch Place 1 patch onto the skin once a week.    Historical Provider, MD  CRANBERRY EXTRACT PO Take 1 tablet by mouth 2 (two) times daily.      Historical Provider, MD  digoxin (LANOXIN) 0.25 MG tablet Take 0.125 mcg by mouth every morning. Pt takes (1/2 tab) for 0.177mcg dose    Historical Provider, MD  diltiazem (CARDIZEM CD) 240 MG 24 hr capsule Take 120 mg by mouth daily.     Historical Provider, MD  esomeprazole (NEXIUM) 40 MG capsule Take 40 mg by mouth daily before breakfast.      Historical Provider, MD  food thickener (THICK IT) POWD With water/liquids. 02/20/11   Cristal Ford, MD  furosemide (  LASIX) 40 MG tablet Take 80 mg by mouth every morning.     Historical Provider, MD  guaiFENesin (MUCINEX) 600 MG 12 hr tablet Take 600 mg by mouth 2 (two) times daily.      Historical Provider, MD  guaiFENesin (ROBITUSSIN) 100 MG/5ML SOLN Take 5 mLs by mouth every 4 (four) hours as needed. cough     Historical Provider, MD  HYDROcodone-acetaminophen (NORCO) 5-325 MG per tablet Take 1 tablet by mouth every 6 (six) hours as needed. pain     Historical Provider, MD  IRON PO Take 1 tablet by mouth daily.    Historical Provider, MD  LORazepam (ATIVAN) 0.5 MG tablet Take 0.25 mg by mouth daily.    Historical Provider, MD  Multiple Vitamins-Minerals (MACULAR VITAMIN BENEFIT PO) Take 2 tablets by mouth.      Historical Provider, MD  Nutritional Supplements (ESTROVEN PO) Take 1 tablet by mouth daily.      Historical Provider, MD  nystatin (MYCOSTATIN) 100000 UNIT/ML suspension Take 200,000 Units by mouth daily.     Historical Provider, MD  Potassium (POTASSIMIN PO) 1.5 tsp daily    Historical Provider, MD  prochlorperazine (COMPAZINE) 5 MG tablet Take 5 mg by mouth every 6 (six) hours as needed.    Historical Provider, MD  XARELTO 15 MG TABS tablet Take 10 mg by mouth daily with supper.  11/07/12   Historical Provider, MD    Allergies:   Allergies  Allergen Reactions  . Penicillin G Sodium   . Sulfonamide Derivatives Hives  . Penicillins Itching and Rash    Social History:  Ambulatory  walker   Lives at home  With family     reports that she quit smoking about 24 years ago. Her smoking use included Cigarettes. She has a 25 pack-year smoking history. She has never used smokeless tobacco. She reports that she does not drink alcohol or use illicit drugs.    Family History: family history includes Allergies in an other family member; Asthma in an other family member; CAD in her father, mother, and sister; Cancer in her brother and sister; Hodgkin's lymphoma in her brother.    Physical Exam: Patient Vitals for the past 24 hrs:  BP Temp Temp src Pulse Resp SpO2 Weight  07/18/13 2102 132/59 mmHg 98.6 F (37 C) Oral 57 20 100 % 46.7 kg (102 lb 15.3 oz)  07/18/13 2015 174/50 mmHg - - 65 19 98 % -  07/18/13 2000 173/45 mmHg - - 65 20 98 % -  07/18/13 1957 - 98.6 F (37 C) Oral - - - -  07/18/13 1945 - - - 65 17 98 % -  07/18/13 1930 - - - 65 22 97 % -  07/18/13 1915 - - - 65 22 97 % -  07/18/13 1900 - - - 64 15 98 % -  07/18/13 1845 - - - 65 20 97 % -  07/18/13 1830 - - - 59 15 96 % -  07/18/13 1815 - - - 65 18 97 % -  07/18/13 1800 169/73 mmHg - - 59 24 96 % -  07/18/13 1745 151/87 mmHg - - 59 15 98 % -  07/18/13 1735 - 101.6 F (38.7 C) Rectal - - - -  07/18/13 1604 152/62 mmHg 100.2 F (37.9 C) Oral 60 20 97 % 50.803 kg (112 lb)    1. General:  in No Acute distress, thin 2. Psychological: Alert but not Oriented 3. Head/ENT:    Dry  Mucous Membranes                          Head Non  traumatic, neck supple                            Poor Dentition 4. SKIN:decreased Skin turgor,  Skin clean Dry and intact no rash 5. Heart: Regular rate and rhythm systolic Murmur, Rub or gallop 6. Lungs: Clear to auscultation bilaterally, no wheezes or crackles   7. Abdomen: Soft, non-tender, Non distended 8. Lower extremities: no clubbing, cyanosis, or edema 9. Neurologically Grossly intact, moving all 4 extremities equally 10. MSK: Normal range of motion  body mass index is 18.83 kg/(m^2).   Labs on Admission:   Recent Labs  07/18/13 1716  NA 142  K 5.0  CL 97  CO2 29  GLUCOSE 208*  BUN 28*  CREATININE 0.80  CALCIUM 9.5    Recent Labs  07/18/13 1716  AST 25  ALT 9  ALKPHOS 86  BILITOT 0.6  PROT 7.0  ALBUMIN 3.5   No results found for this basename: LIPASE, AMYLASE,  in the last 72 hours  Recent Labs  07/18/13 1716  WBC 16.1*  NEUTROABS 14.6*  HGB 11.9*  HCT 36.0  MCV 91.4  PLT 224   No results found for this basename: CKTOTAL, CKMB, CKMBINDEX, TROPONINI,  in the last 72 hours No results found for this basename: TSH, T4TOTAL, FREET3, T3FREE, THYROIDAB,  in the last 72 hours No results found for this basename: VITAMINB12, FOLATE, FERRITIN, TIBC, IRON, RETICCTPCT,  in the last 72 hours No results found for this basename: HGBA1C    The CrCl is unknown because both a height and weight (above a minimum accepted value) are required for this calculation. ABG    Component Value Date/Time   PHART 7.449* 02/14/2011 0950   HCO3 29.2* 02/14/2011 0950   TCO2 26.6 02/14/2011 0950   O2SAT 91.8 02/14/2011 0950     Lab Results  Component Value Date   DDIMER 1.27* 02/14/2011     UA no UTI  BNP (last 3 results) No results found for this basename: PROBNP,  in the last 8760 hours  Filed Weights   07/18/13 1604 07/18/13 2102  Weight: 50.803 kg (112 lb) 46.7 kg (102 lb 15.3 oz)     Cultures:    Component Value Date/Time   SDES URINE, CATHETERIZED  12/26/2011 1720   SPECREQUEST NONE 12/26/2011 1720   CULT ESCHERICHIA COLI 12/26/2011 1720   REPTSTATUS 12/30/2011 FINAL 12/26/2011 1720         Radiological Exams on Admission: Dg Chest 2 View  07/18/2013   CLINICAL DATA:  Fever  EXAM: CHEST  2 VIEW  COMPARISON:  01/05/2013  FINDINGS: Chronic pleural scarring/fluid on the right. New area of airspace disease in the right midlung, suspicious for pneumonia. Followup recommended. Underlying mass lesion cannot be excluded.  COPD with hyperinflation of the lungs. Left lower lobe scarring is stable. Single lead pacemaker is unchanged. Negative for heart failure.  IMPRESSION: New airspace disease in the right midlung, most likely pneumonia. Followup until clearing is suggested to exclude mass lesion.  There is COPD and scarring. Right pleural effusion/pleural scarring.   Electronically Signed   By: Marlan Palau M.D.   On: 07/18/2013 18:45    Chart has been reviewed  Assessment/Plan  80 yo F w hx of a.fib on Xarelto and  COPD on 3L of home oxygen here with CAP with hx of dysphasia  Present on Admission:  . CAP (community acquired pneumonia) - given hx of COPD will cover broadly with Levaquin and vanc, patient isPen allergic. Oxygen as needed. aspiration precautions. Will need to repeat imaging to document clearing given risk factors.  . Atrial fibrillation - continue, xarelto, diltiazem and digoxin. Obtain digoxin level, vitals are stable . CHF (congestive heart failure) - currently euvolemic . COPD clinical dx - continue nebulizer treatment . Coronary artery disease - stable no chest pain . Dysphagia - continue dysphagia diet, pudding thick consistency . HYPERTENSION - continue home meds . Sepsis - continue to monitor vitals, IV braod spectrum antibiotics.    Prophylaxis: xarelto, Protonix  CODE STATUS:  limited code, no CPR no intubation  Other plan as per orders.  I have spent a total of 55 min on this admission  Ericberto Padget 07/18/2013, 10:08 PM

## 2013-07-18 NOTE — ED Notes (Signed)
Spoke with pharmacist at [facility] about abx infusions.

## 2013-07-18 NOTE — Progress Notes (Addendum)
ANTIBIOTIC CONSULT NOTE - INITIAL  Pharmacy Consult for vancomycin, levaquin Indication: pneumonia  Allergies  Allergen Reactions  . Penicillin G Sodium   . Sulfonamide Derivatives Hives  . Penicillins Itching and Rash    Patient Measurements: Weight: 112 lb (50.803 kg) Adjusted Body Weight:   Vital Signs: Temp: 101.6 F (38.7 C) (05/25 1735) Temp src: Rectal (05/25 1735) BP: 169/73 mmHg (05/25 1800) Pulse Rate: 59 (05/25 1800) Intake/Output from previous day:   Intake/Output from this shift:    Labs:  Recent Labs  07/18/13 1716  WBC 16.1*  HGB 11.9*  PLT 224  CREATININE 0.80   The CrCl is unknown because both a height and weight (above a minimum accepted value) are required for this calculation. No results found for this basename: VANCOTROUGH, VANCOPEAK, VANCORANDOM, GENTTROUGH, GENTPEAK, GENTRANDOM, TOBRATROUGH, TOBRAPEAK, TOBRARND, AMIKACINPEAK, AMIKACINTROU, AMIKACIN,  in the last 72 hours   Microbiology: No results found for this or any previous visit (from the past 720 hour(s)).  Medical History: Past Medical History  Diagnosis Date  . Femoral artery occlusion   . Coronary artery disease   . COPD (chronic obstructive pulmonary disease)     F/B Dr. Francella Solian. On O2.  [name] Kitchen Dysphagia, oropharyngeal phase     pills have to be crushed and given with applesauce  . Arthritis   . Degenerative arthritis of spine   . Pneumonia     hx aspiraton pna  . Bleeding nose Dec. 2012  . H/O hiatal hernia   . GERD (gastroesophageal reflux disease)   . DDD (degenerative disc disease)   . Osteoarthritis   . Peripheral vascular disease   . ESBL (extended spectrum beta-lactamase) producing bacteria infection   . Constipation   . Gangrene     of the bowels  . Hyperlipidemia   . Osteomalacia, unspecified   . Essential hypertension, benign   . Cardiac pacemaker in situ     Implanted 08/05/07 PPM Generator - Manufacturer: [employer], Model # [id number] XL SR [id number], Serial # [id number]   . PAD (peripheral artery disease)   . Protein-calorie malnutrition   . Supratherapeutic INR   . Acute and chronic respiratory failure   . Unspecified pleural effusion     Interval moderate-sized right pleural fusion  . Hypokalemia   . Atrial fibrillation     On coumadin  . CHF (congestive heart failure)     ECHO 02/15/11 EF = 55-60%, Ventricular septum showed abnormal function & dyssynergy, Moderate MV regurgitation, LA mod-severely dilated, Pulmonary systolic pressure was moderately increased. (Note mod MR and LA mod/severly enlarged, most likely cause of her R effusion)  . UTI (lower urinary tract infection)   . Pressure ulcer, buttock(707.05)     Medications:   (Not in a hospital admission)  Assessment: 80 year old female with weakness, on home O2.  Renal function stable, SCr <1, CrCl 30-35 ml/min.  Leukocytosis to 16.2, febrile.   Goal of Therapy:  Vancomycin trough level 15-20 mcg/ml  Plan:  Levaquin 750 mg IV x1, followed by 500 mg IV q48h Vancomycin 1 g IV x1, 750 mg IV q24h Vanc trough as indicated Monitor cultures, fever curve, resolution of infection   Agapito Games, PharmD, BCPS Clinical Pharmacist Pager: [phone] 07/18/2013 7:21 PM

## 2013-07-18 NOTE — ED Notes (Signed)
Weakness when she woke at 12pm today. Home oxygen 3l/m [place].

## 2013-07-18 NOTE — ED Notes (Signed)
Report given to Jennifer.

## 2013-07-18 NOTE — ED Provider Notes (Signed)
CSN: [id number]     Arrival date & time 07/18/13  1554 History  This chart was scribed for Audree CamelScott T Amadi Yoshino, MD by Charline BillsEssence Howell, ED Scribe. The patient was seen in room MH03/MH03. Patient's care was started at 4:45 PM.   Chief Complaint  Patient presents with  . Weakness    The history is provided by the patient and a relative. No language interpreter was used.   HPI Comments: Peggy Harvey is a 80 y.o. female, with a h/o aspiration pneumonia, who presents to the Emergency Department complaining of weakness upon waking today at 12 PM. Pt's relative reports associated fever, intermittent SOB onset today, chronic productive cough and dizziness. ED temperature 100.2 F. Pt's relative states that pt has been declining, less talkative and having difficulty getting around; she attributed this to pt's age, but states that symptoms have worsened today. She denies vomiting, diarrhea, abdominal pain. Pt wears 3L O2 at home.  Past Medical History  Diagnosis Date  . Femoral artery occlusion   . Coronary artery disease   . COPD (chronic obstructive pulmonary disease)     F/B Dr. Francella SolianMike Wert. On O2.  [name] Kitchen. Dysphagia, oropharyngeal phase     pills have to be crushed and given with applesauce  . Arthritis   . Degenerative arthritis of spine   . Pneumonia     hx aspiraton pna  . Bleeding nose Dec. 2012  . H/O hiatal hernia   . GERD (gastroesophageal reflux disease)   . DDD (degenerative disc disease)   . Osteoarthritis   . Peripheral vascular disease   . ESBL (extended spectrum beta-lactamase) producing bacteria infection   . Constipation   . Gangrene     of the bowels  . Hyperlipidemia   . Osteomalacia, unspecified   . Essential hypertension, benign   . Cardiac pacemaker in situ     Implanted 08/05/07 PPM Generator - Manufacturer: [employer], Model # ZEPHYR XL SR [id number], Serial # [id number]  . PAD (peripheral artery disease)   . Protein-calorie malnutrition   . Supratherapeutic INR   . Acute and  chronic respiratory failure   . Unspecified pleural effusion     Interval moderate-sized right pleural fusion  . Hypokalemia   . Atrial fibrillation     On coumadin  . CHF (congestive heart failure)     ECHO 02/15/11 EF = 55-60%, Ventricular septum showed abnormal function & dyssynergy, Moderate MV regurgitation, LA mod-severely dilated, Pulmonary systolic pressure was moderately increased. (Note mod MR and LA mod/severly enlarged, most likely cause of her R effusion)  . UTI (lower urinary tract infection)   . Pressure ulcer, buttock(707.05)    Past Surgical History  Procedure Laterality Date  . Abdominal hysterectomy      In her 30's. Ovaries left due to DUB  . Appendectomy    . Stomach surgery  1940's    stomach surgery due to gangrene  . Cholecystectomy    . Cataract extraction, bilateral    . Permanent pacemaker insertion  08/05/07    PPM Generator - Manufacturer: [employer], Model # ZEPHYR XL SR [id number], Serial # [id number]   Family History  Problem Relation Age of Onset  . CAD Mother   . CAD Father   . CAD Sister   . Cancer Sister     breast  . Hodgkin's lymphoma Brother   . Cancer Brother     lung  . Allergies    . Asthma     History  Substance Use Topics  . Smoking status: Former Smoker -- 0.50 packs/day for 50 years    Types: Cigarettes    Quit date: 02/13/1989  . Smokeless tobacco: Never Used  . Alcohol Use: No   OB History   Grav Para Term Preterm Abortions TAB SAB Ect Mult Living                 Review of Systems  Constitutional: Positive for fever.  Respiratory: Positive for cough (chronic) and shortness of breath.   Gastrointestinal: Negative for vomiting, abdominal pain and diarrhea.  Neurological: Positive for dizziness and weakness.  All other systems reviewed and are negative.  Allergies  Penicillin g sodium; Sulfonamide derivatives; and Penicillins  Home Medications   Prior to Admission medications   Medication Sig Start Date End Date Taking?  Authorizing Provider  levalbuterol [facility] HFA) 45 MCG/ACT inhaler Inhale into the lungs every 4 (four) hours as needed for wheezing.   Yes Historical Provider, MD  acetaminophen (TYLENOL) 500 MG tablet Take 500 mg by mouth every 6 (six) hours as needed.    Historical Provider, MD  albuterol (PROAIR HFA) 108 (90 BASE) MCG/ACT inhaler Inhale 2 puffs into the lungs every 4 (four) hours as needed for wheezing. 2 puffs every 4 hours as needed only  if your can't catch your breath 12/07/12   Nyoka Cowden, MD  albuterol (PROVENTIL) (2.5 MG/3ML) 0.083% nebulizer solution Take 3 mLs (2.5 mg total) by nebulization every 6 (six) hours as needed for wheezing. 12/07/12   Nyoka Cowden, MD  arformoterol (BROVANA) 15 MCG/2ML NEBU Take 2 mLs (15 mcg total) by nebulization 2 (two) times daily. DX:  496, FILE WITH MCR PART B 03/03/11   Nyoka Cowden, MD  Ascorbic Acid (VITAMIN C) 1000 MG tablet Take 4,000 mg by mouth daily.      Historical Provider, MD  budesonide (PULMICORT) 0.5 MG/2ML nebulizer solution Take 2 mLs (0.5 mg total) by nebulization 2 (two) times daily. 04/25/13   Nyoka Cowden, MD  Calcium Carbonate-Vitamin D (OS-CAL 500 + D) 500-500 MG-UNIT CHEW Chew 1 tablet by mouth 2 (two) times daily.      Historical Provider, MD  Cholecalciferol (VITAMIN D) 2000 UNITS tablet Take 2,000 Units by mouth daily.      Historical Provider, MD  cloNIDine (CATAPRES-TTS-1) 0.1 mg/24hr patch Place 1 patch onto the skin once a week.    Historical Provider, MD  CRANBERRY EXTRACT PO Take 1 tablet by mouth 2 (two) times daily.      Historical Provider, MD  digoxin (LANOXIN) 0.25 MG tablet Take 0.125 mcg by mouth every morning. Pt takes (1/2 tab) for 0.115mcg dose    Historical Provider, MD  diltiazem (CARDIZEM CD) 240 MG 24 hr capsule Take 120 mg by mouth daily.     Historical Provider, MD  esomeprazole (NEXIUM) 40 MG capsule Take 40 mg by mouth daily before breakfast.      Historical Provider, MD  food thickener (THICK IT)  POWD With water/liquids. 02/20/11   Cristal Ford, MD  furosemide (LASIX) 40 MG tablet Take 80 mg by mouth every morning.     Historical Provider, MD  guaiFENesin (MUCINEX) 600 MG 12 hr tablet Take 600 mg by mouth 2 (two) times daily.      Historical Provider, MD  guaiFENesin (ROBITUSSIN) 100 MG/5ML SOLN Take 5 mLs by mouth every 4 (four) hours as needed. cough     Historical Provider, MD  HYDROcodone-acetaminophen (NORCO) 5-325 MG  per tablet Take 1 tablet by mouth every 6 (six) hours as needed. pain     Historical Provider, MD  IRON PO Take 1 tablet by mouth daily.    Historical Provider, MD  LORazepam (ATIVAN) 0.5 MG tablet Take 0.25 mg by mouth daily.    Historical Provider, MD  Multiple Vitamins-Minerals (MACULAR VITAMIN BENEFIT PO) Take 2 tablets by mouth.      Historical Provider, MD  Nutritional Supplements (ESTROVEN PO) Take 1 tablet by mouth daily.      Historical Provider, MD  nystatin (MYCOSTATIN) 100000 UNIT/ML suspension Take 200,000 Units by mouth daily.    Historical Provider, MD  Potassium (POTASSIMIN PO) 1.5 tsp daily    Historical Provider, MD  prochlorperazine (COMPAZINE) 5 MG tablet Take 5 mg by mouth every 6 (six) hours as needed.    Historical Provider, MD  XARELTO 15 MG TABS tablet  11/07/12   Historical Provider, MD   Triage Vitals: BP 152/62  Pulse 60  Temp(Src) 100.2 F (37.9 C) (Oral)  Resp 20  Wt 112 lb (50.803 kg)  SpO2 97% Physical Exam  Nursing note and vitals reviewed. Constitutional: She is oriented to person, place, and time. She appears well-developed and well-nourished. No distress.  HENT:  Head: Normocephalic and atraumatic.  Eyes: EOM are normal.  Neck: Neck supple.  Cardiovascular: Normal rate.   Pulmonary/Chest: Effort normal. No respiratory distress.  Abdominal: There is no tenderness.  Musculoskeletal: Normal range of motion.  Neurological: She is alert and oriented to person, place, and time.  Skin: Skin is warm and dry.  Psychiatric: She  has a normal mood and affect. Her behavior is normal.   ED Course  Procedures (including critical care time) DIAGNOSTIC STUDIES: Oxygen Saturation is 97% on [place], normal by my interpretation.    COORDINATION OF CARE: 4:51 PM Discussed treatment plan with pt at bedside and pt agreed to plan.  Labs Review Labs Reviewed  CBC WITH DIFFERENTIAL - Abnormal; Notable for the following:    WBC 16.1 (*)    Hemoglobin 11.9 (*)    Neutrophils Relative % 91 (*)    Neutro Abs 14.6 (*)    Lymphocytes Relative 3 (*)    Lymphs Abs 0.4 (*)    All other components within normal limits  COMPREHENSIVE METABOLIC PANEL - Abnormal; Notable for the following:    Glucose, Bld 208 (*)    BUN 28 (*)    GFR calc non Af Amer 60 (*)    GFR calc Af Amer 69 (*)    All other components within normal limits  CULTURE, BLOOD (ROUTINE X 2)  CULTURE, BLOOD (ROUTINE X 2)  URINE CULTURE  URINALYSIS, ROUTINE W REFLEX MICROSCOPIC  DIGOXIN LEVEL  LACTIC ACID, PLASMA  I-STAT CG4 LACTIC ACID, ED    Imaging Review Dg Chest 2 View  07/18/2013   CLINICAL DATA:  Fever  EXAM: CHEST  2 VIEW  COMPARISON:  01/05/2013  FINDINGS: Chronic pleural scarring/fluid on the right. New area of airspace disease in the right midlung, suspicious for pneumonia. Followup recommended. Underlying mass lesion cannot be excluded.  COPD with hyperinflation of the lungs. Left lower lobe scarring is stable. Single lead pacemaker is unchanged. Negative for heart failure.  IMPRESSION: New airspace disease in the right midlung, most likely pneumonia. Followup until clearing is suggested to exclude mass lesion.  There is COPD and scarring. Right pleural effusion/pleural scarring.   Electronically Signed   By: Marlan Palau M.D.   On: 07/18/2013  18:45     EKG Interpretation None      MDM   Final diagnoses:  CAP (community acquired pneumonia)  Sepsis    Patient with fever, elevated WBC, dyspnea and PNA. No recent admissions (last over 2 years  ago). She is stable here, with normal BP and no new hypoxia. Given her age, comorbidities and since she qualifies as sepsis, will admit to [facility] with IV abx and supportive care. Discussed with Dr. Julian Reil, hospitalist, who accepts transfer and admission.  I personally performed the services described in this documentation, which was scribed in my presence. The recorded information has been reviewed and is accurate.    Audree Camel, MD 07/19/13 [phone]

## 2013-07-19 DIAGNOSIS — Z950 Presence of cardiac pacemaker: Secondary | ICD-10-CM

## 2013-07-19 DIAGNOSIS — E43 Unspecified severe protein-calorie malnutrition: Secondary | ICD-10-CM | POA: Insufficient documentation

## 2013-07-19 DIAGNOSIS — M129 Arthropathy, unspecified: Secondary | ICD-10-CM

## 2013-07-19 LAB — LEGIONELLA ANTIGEN, URINE: Legionella Antigen, Urine: NEGATIVE

## 2013-07-19 LAB — EXPECTORATED SPUTUM ASSESSMENT W GRAM STAIN, RFLX TO RESP C: Special Requests: NORMAL

## 2013-07-19 LAB — COMPREHENSIVE METABOLIC PANEL
ALBUMIN: 3.2 g/dL — AB (ref 3.5–5.2)
ALT: 15 U/L (ref 0–35)
AST: 30 U/L (ref 0–37)
Alkaline Phosphatase: 69 U/L (ref 39–117)
BUN: 25 mg/dL — AB (ref 6–23)
CALCIUM: 9.7 mg/dL (ref 8.4–10.5)
CO2: 30 mEq/L (ref 19–32)
CREATININE: 0.78 mg/dL (ref 0.50–1.10)
Chloride: 96 mEq/L (ref 96–112)
GFR calc Af Amer: 79 mL/min — ABNORMAL LOW (ref >90)
GFR calc non Af Amer: 68 mL/min — ABNORMAL LOW (ref >90)
Glucose, Bld: 101 mg/dL — ABNORMAL HIGH (ref 70–99)
Potassium: 4.2 mEq/L (ref 3.7–5.3)
Sodium: 141 mEq/L (ref 137–147)
TOTAL PROTEIN: 6.9 g/dL (ref 6.0–8.3)
Total Bilirubin: 1.1 mg/dL (ref 0.3–1.2)

## 2013-07-19 LAB — PREALBUMIN: PREALBUMIN: 15.2 mg/dL — AB (ref 17.0–34.0)

## 2013-07-19 LAB — MRSA PCR SCREENING: MRSA BY PCR: NEGATIVE

## 2013-07-19 LAB — EXPECTORATED SPUTUM ASSESSMENT W REFEX TO RESP CULTURE

## 2013-07-19 MED ORDER — LORAZEPAM 0.5 MG PO TABS: 0.5000 mg | ORAL_TABLET | Freq: Two times a day (BID) | ORAL | Status: DC | PRN

## 2013-07-19 MED ORDER — DIGOXIN 0.0625 MG HALF TABLET
0.0625 mg | ORAL_TABLET | Freq: Every day | ORAL | Status: DC
  Administered 2013-07-19 – 2013-07-20 (×2): 0.0625 mg via ORAL
  Filled 2013-07-19 (×2): qty 1

## 2013-07-19 MED ORDER — ENSURE PUDDING PO PUDG
1.0000 | Freq: Two times a day (BID) | ORAL | Status: DC
  Administered 2013-07-19 – 2013-07-20 (×3): 1 via ORAL
  Filled 2013-07-19 (×4): qty 1

## 2013-07-19 MED ORDER — ACETAMINOPHEN 325 MG PO TABS
650.0000 mg | ORAL_TABLET | Freq: Once | ORAL | Status: AC
  Administered 2013-07-19: 650 mg via ORAL
  Filled 2013-07-19: qty 2

## 2013-07-19 MED ORDER — LORAZEPAM 0.5 MG PO TABS
0.2500 mg | ORAL_TABLET | Freq: Every day | ORAL | Status: DC
  Administered 2013-07-19: 0.25 mg via ORAL

## 2013-07-19 MED ORDER — BIOTENE DRY MOUTH MT LIQD
15.0000 mL | Freq: Two times a day (BID) | OROMUCOSAL | Status: DC
  Administered 2013-07-19: 15 mL via OROMUCOSAL

## 2013-07-19 MED ORDER — ARFORMOTEROL TARTRATE 15 MCG/2ML IN NEBU
15.0000 ug | INHALATION_SOLUTION | Freq: Two times a day (BID) | RESPIRATORY_TRACT | Status: DC
  Administered 2013-07-19 – 2013-07-20 (×3): 15 ug via RESPIRATORY_TRACT
  Filled 2013-07-19 (×6): qty 2

## 2013-07-19 MED ORDER — DIGOXIN 0.0625 MG HALF TABLET: 0.0625 mg | ORAL_TABLET | Freq: Every day | ORAL | Status: DC

## 2013-07-19 MED ORDER — ALBUTEROL SULFATE (2.5 MG/3ML) 0.083% IN NEBU: 2.5000 mg | INHALATION_SOLUTION | RESPIRATORY_TRACT | Status: DC | PRN

## 2013-07-19 MED ORDER — ENSURE COMPLETE PO LIQD: 237.0000 mL | Freq: Two times a day (BID) | ORAL | Status: DC

## 2013-07-19 NOTE — Plan of Care (Deleted)
Problem: Phase I Progression Outcomes Goal: Pain controlled with appropriate interventions Outcome: Progressing Patient reports pain constantly at 7 or less. Pain boluses "help" decrease pain back down when increase above a 7. On dilaudid gtt at 2 mg/h.

## 2013-07-19 NOTE — Progress Notes (Signed)
INITIAL NUTRITION ASSESSMENT  DOCUMENTATION CODES Per approved criteria  -Severe malnutrition in the context of chronic illness  Pt meets criteria for severe MALNUTRITION in the context of chronic illness as evidenced by 22% body weight loss in one year, PO intake <75% for > one month, severe muscle wasting in clavicle and temple regions.   INTERVENTION: -Recommend Ensure Complete BID w/meals -Recommend Ensure pudding BID -Recommend MagicCup and nourishments TID -Continue with regular diet textures-family in agreement to thicken liquids and select foods pt can tolerate.   NUTRITION DIAGNOSIS: Inadequate oral intake related to decreased appetite/early satiety/nausea as evidenced by PO intake <75%, 30 lbs wt loss.   Goal: Pt to meet >/= 90% of their estimated nutrition needs    Monitor:  Total protein/energy intake, labs, weights, swallow profile  Reason for Assessment: MST/Consult  80 y.o. female  Admitting Dx: <principal problem not specified>  ASSESSMENT: Pt w/progressive declineover past 1 year. She has not been [employer] well and has los 30 lb over past 1 year. She has hx of dysphagia and of thickened fluids pudding consistency. Family states since she got up she was lightheaded and not feeling well. [facility] and was found to be febrile with CXR worrisome for infiltrate. Patietn is on home O2 at 3L due to hx of COPD.   -Pt's family noted overall decreased appetite and wt loss over past one year. -Endorsed unintentional wt loss of 30 lbs, usual weight around 130 lbs -Diet recall indicates pt consumes cereal/egg for breakfast, a protein shake and/or Ensure for lunch, and cheese and crackers for dinner -Requires soften foods d/t hx of dysphagia. Pt requires liquids to be nectar/pudding thick; however, daughter declining fluid/diet texture modifications at this time as she will be bedside to order pt's foods and thicken liquids as tolerated by pt -Endorsed feelings  of nausea that also contributes to decreased appetite. Nausea is improving during admit-had not yet received breakfast -Encouraged intake of soft proteins- cottage cheese, yogurt, etc and dicussed supplement alternatives -Willing to trial Ensure Pudding and MagicCup -With evident severe wasting on clavicle, temple and acromion bone regions  Height: Ht Readings from Last 1 Encounters:  07/18/13 5\' 1"  (1.549 m)    Weight: Wt Readings from Last 1 Encounters:  07/18/13 102 lb 15.3 oz (46.7 kg)    Ideal Body Weight: 105 lbs  % Ideal Body Weight: 97%  Wt Readings from Last 10 Encounters:  07/18/13 102 lb 15.3 oz (46.7 kg)  01/05/13 112 lb (50.803 kg)  12/07/12 115 lb (52.164 kg)  12/06/12 115 lb (52.164 kg)  12/26/11 126 lb (57.153 kg)  05/05/11 136 lb (61.689 kg)  02/24/11 151 lb (68.493 kg)  02/15/11 153 lb (69.4 kg)  02/10/11 150 lb (68.04 kg)  04/27/08 146 lb 8 oz (66.452 kg)    Usual Body Weight: 132 lbs  % Usual Body Weight: 77%  BMI:  Body mass index is 19.46 kg/(m^2).  Estimated Nutritional Needs: Kcal: 1200-1400 Protein: 55-65 gram Fluid: >/=1400 ml/dail  Skin:stage I pressure ulcer on buttock  Diet Order: General  EDUCATION NEEDS: -No education needs identified at this time   Intake/Output Summary (Last 24 hours) at 07/19/13 1126 Last data filed at 07/19/13 0530  Gross per 24 hour  Intake    486 ml  Output     15 ml  Net    471 ml    Last BM: 5/25   Labs:   Recent Labs Lab 07/18/13 1716 07/19/13 0405  NA 142 141  K 5.0 4.2  CL 97 96  CO2 29 30  BUN 28* 25*  CREATININE 0.80 0.78  CALCIUM 9.5 9.7  GLUCOSE 208* 101*    CBG (last 3)  No results found for this basename: GLUCAP,  in the last 72 hours  Scheduled Meds: . antiseptic oral rinse  15 mL Mouth Rinse BID  . arformoterol  15 mcg Nebulization Q12H  . budesonide  0.5 mg Nebulization BID  . cloNIDine  0.1 mg Transdermal Weekly  . digoxin  0.125 mg Oral Daily  . diltiazem  120  mg Oral Daily  . feeding supplement (ENSURE COMPLETE)  237 mL Oral BID WC  . feeding supplement (ENSURE)  1 Container Oral BID BM  . furosemide  80 mg Oral Daily  . guaiFENesin  15 mL Oral 4 times per day  . levofloxacin (LEVAQUIN) IV  500 mg Intravenous Q48H  . LORazepam  0.25 mg Oral Daily  . pantoprazole  40 mg Oral Daily  . Rivaroxaban  10 mg Oral Q supper  . vancomycin  750 mg Intravenous Q24H    Continuous Infusions:   Past Medical History  Diagnosis Date  . Femoral artery occlusion   . Coronary artery disease   . COPD (chronic obstructive pulmonary disease)     F/B Dr. Francella Solian. On O2.  [name] Kitchen Dysphagia, oropharyngeal phase     pills have to be crushed and given with applesauce  . Arthritis   . Degenerative arthritis of spine   . Pneumonia     hx aspiraton pna  . Bleeding nose Dec. 2012  . H/O hiatal hernia   . GERD (gastroesophageal reflux disease)   . DDD (degenerative disc disease)   . Osteoarthritis   . Peripheral vascular disease   . ESBL (extended spectrum beta-lactamase) producing bacteria infection   . Constipation   . Gangrene     of the bowels  . Hyperlipidemia   . Osteomalacia, unspecified   . Essential hypertension, benign   . Cardiac pacemaker in situ     Implanted 08/05/07 PPM Generator - Manufacturer: [employer], Model # ZEPHYR XL SR [id number], Serial # [id number]  . PAD (peripheral artery disease)   . Protein-calorie malnutrition   . Supratherapeutic INR   . Acute and chronic respiratory failure   . Unspecified pleural effusion     Interval moderate-sized right pleural fusion  . Hypokalemia   . Atrial fibrillation     On coumadin  . CHF (congestive heart failure)     ECHO 02/15/11 EF = 55-60%, Ventricular septum showed abnormal function & dyssynergy, Moderate MV regurgitation, LA mod-severely dilated, Pulmonary systolic pressure was moderately increased. (Note mod MR and LA mod/severly enlarged, most likely cause of her R effusion)  . UTI (lower urinary  tract infection)   . Pressure ulcer, buttock(707.05)     Past Surgical History  Procedure Laterality Date  . Abdominal hysterectomy      In her 30's. Ovaries left due to DUB  . Appendectomy    . Stomach surgery  1940's    stomach surgery due to gangrene  . Cholecystectomy    . Cataract extraction, bilateral    . Permanent pacemaker insertion  08/05/07    PPM Generator - Manufacturer: [employer], Model # ZEPHYR XL SR [id number], Serial # [id number]    Lloyd Huger MS RD LDN Clinical Dietitian [phone]

## 2013-07-19 NOTE — Evaluation (Addendum)
Physical Therapy Evaluation Patient Details Name: ATZHIRI RATHSACK MRN: [id number] DOB: March 24, 1913 Today's Date: 07/19/2013   History of Present Illness  80 yo female admitted with weakness. Hx of Afib, asp pna, COPD, arthritis, Pna, pacemaker, osteomalacia. Pt lives with daughter.   Clinical Impression  On eval, pt required Max assist for mobility-able to stand briefly x 2 at EOB with walker. Daughter not present during eval however family friend states plan is for pt to return home.     Follow Up Recommendations Home health PT;Supervision/Assistance - 24 hour    Equipment Recommendations  None recommended by PT    Recommendations for Other Services       Precautions / Restrictions Precautions Precautions: Fall Restrictions Weight Bearing Restrictions: No      Mobility  Bed Mobility Overal bed mobility: Needs Assistance Bed Mobility: Supine to Sit;Sit to Supine     Supine to sit: Max assist Sit to supine: Max assist   General bed mobility comments: assist for trunk and bil LEs. Increased time. Utilized bedpad for scooting, positioning  Transfers Overall transfer level: Needs assistance Equipment used: Rolling walker (2 wheeled) Transfers: Sit to/from Stand Sit to Stand: From elevated surface;Max assist         General transfer comment: Assist to rise, stabilize, control descent. Sit to stand x 2 with walker-pt maintains flexed posture-only able to stand for ~15-20 seconds. Bowel incontinence on 2nd trial of standing. Deferred further activity and assisted pt back to bed.  Ambulation/Gait             General Gait Details: Pt unable  Stairs            Wheelchair Mobility    Modified Rankin (Stroke Patients Only)       Balance                                             Pertinent Vitals/Pain LEs when moved. Unrated.     Home Living Family/patient expects to be discharged to:: Private residence Living Arrangements:  Children Available Help at Discharge: Family Type of Home: House       Home Layout: One level Home Equipment: Environmental consultant - 4 wheels;Walker - 2 wheels;Wheelchair - manual      Prior Function Level of Independence: Needs assistance               Hand Dominance        Extremity/Trunk Assessment   Upper Extremity Assessment: Generalized weakness           Lower Extremity Assessment: Generalized weakness      Cervical / Trunk Assessment: Kyphotic  Communication   Communication: HOH  Cognition Arousal/Alertness: Awake/alert Behavior During Therapy: WFL for tasks assessed/performed Overall Cognitive Status: Within Functional Limits for tasks assessed                      General Comments      Exercises  APs x 10 bil LEs LAQs x 5-limited ROM, bil LEs      Assessment/Plan    PT Assessment Patient needs continued PT services  PT Diagnosis Generalized weakness;Difficulty walking   PT Problem List Decreased strength;Decreased activity tolerance;Decreased balance;Decreased mobility;Decreased knowledge of use of DME  PT Treatment Interventions DME instruction;Gait training;Functional mobility training;Therapeutic activities;Therapeutic exercise;Balance training   PT Goals (Current goals can be found in the Care  Plan section) Acute Rehab PT Goals Patient Stated Goal: none stated. Plan is for home per family friend PT Goal Formulation: With patient Time For Goal Achievement: 08/02/13 Potential to Achieve Goals: Fair    Frequency Min 3X/week   Barriers to discharge        Co-evaluation               End of Session Equipment Utilized During Treatment: Gait belt Activity Tolerance: Patient limited by fatigue Patient left: in bed;with call bell/phone within reach;with nursing/sitter in room           Time: 1512-1542 PT Time Calculation (min): 30 min   Charges:   PT Evaluation $Initial PT Evaluation Tier I: 1 Procedure PT  Treatments $Therapeutic Activity: 23-37 mins   PT G Codes:          Rebeca AlertJannie Jeremiah Curci, MPT Pager: [phone]

## 2013-07-19 NOTE — Plan of Care (Addendum)
Problem: Phase I Progression Outcomes Goal: OOB as tolerated unless otherwise ordered Outcome: Completed/Met Date Met:  07/19/13 oob to bsc with assist.

## 2013-07-19 NOTE — Progress Notes (Signed)
Patient admitted from [facility] med center to room 1303 with PNA. Patient alert and oriented. Congested nonproductive cough. Patient with hx of ESBL and MRSA so placed on Contact Precautions.

## 2013-07-19 NOTE — Progress Notes (Signed)
Patient ID: Peggy Harvey, female   DOB: March 19, 1913, 80 y.o.   MRN: [id number]  [facility] PROGRESS NOTE  Peggy Harvey NWG:[id number] DOB: March 19, 1913 DOA: 07/18/2013 PCP: Gweneth DimitriMCNEILL,WENDY, MD  Brief narrative: 80 y.o. female with CAD, COPD on 3 L oxygen at home, PVD, HTN, HLD, atrial fibrillation on Xarelto, [facility] with main concern of several days duration of progressively worsening shortness of breath, exertional and at rest, associated with generalized weakness, subjective fevers, chills. CXR worrisome for PNA and [facility] asked to admit for further evaluation.   Active Problems:   Acute hypoxic respiratory failure - secondary to PNA, unclear causative agent, imposed on chronic COPD - will continue treatment with ABX but will stop Vancomycin and continue only Levaquin day #2 - follow up on sputum analysis, urine legionella and strep pneumo  - provide oxygen, BD's scheduled and as needed    Leukocytosis - from PNA - continue Levaquin and repeat CBC in AM   HYPERTENSION - reasonable inpatient control  - continue Clonidine, Cardizem - hold Lasix for now   Atrial fibrillation - rate controlled - continue Xarelto, digoxin, Cardizem    COPD  - clinically compensated - continue oxygen BD's scheduled and as needed    Dysphagia - pt requesting regular diet, allow feeding as pt able to tolerate    CHF (congestive heart failure) - last 2 D ECHO with diastolic grade I dysfunction, normal EF 55 % - clinically compensated - hold lasix this AM due to softer BP - weight this AM 102 lbs    Protein-calorie malnutrition, severe - advance diet as pt able to tolerate   Consultants:  None   Procedures/Studies: Dg Chest 2 View  07/18/2013  New airspace disease in the right midlung, most likely pneumonia. Followup until clearing is suggested to exclude mass lesion.  There is COPD and scarring. Right pleural effusion/pleural scarring.     Antibiotics:  Vancomycin 5/24 --> 5/25    Levaquin 5/24 -->   Code Status: Partial code  Family Communication: Pt and daughter at bedside Disposition Plan: Home when medically stable, possibly in 24 - 48 hours   HPI/Subjective: No events overnight.   Objective: Filed Vitals:   07/18/13 2115 07/19/13 0238 07/19/13 0635 07/19/13 0750  BP:  122/51 120/54   Pulse: 62 59 64   Temp:  98.7 F (37.1 C) 98.6 F (37 C)   TempSrc:  Oral Oral   Resp:  20 20   Height:      Weight:      SpO2:  98% 100% 95%    Intake/Output Summary (Last 24 hours) at 07/19/13 1302 Last data filed at 07/19/13 0530  Gross per 24 hour  Intake    486 ml  Output     15 ml  Net    471 ml    Exam:   General:  Pt is alert, follows commands appropriately, not in acute distress  Cardiovascular: Regular rate and rhythm, no rubs, no gallops  Respiratory: Clear to auscultation bilaterally, minimal rales at bases   Abdomen: Soft, non tender, non distended, bowel sounds present, no guarding  Extremities: No edema, pulses DP and PT palpable bilaterally  Neuro: Grossly nonfocal  Data Reviewed: Basic Metabolic Panel:  Recent Labs Lab 07/18/13 1716 07/19/13 0405  NA 142 141  K 5.0 4.2  CL 97 96  CO2 29 30  GLUCOSE 208* 101*  BUN 28* 25*  CREATININE 0.80 0.78  CALCIUM 9.5 9.7   Liver  Function Tests:  Recent Labs Lab 07/18/13 1716 07/19/13 0405  AST 25 30  ALT 9 15  ALKPHOS 86 69  BILITOT 0.6 1.1  PROT 7.0 6.9  ALBUMIN 3.5 3.2*   CBC:  Recent Labs Lab 07/18/13 1716  WBC 16.1*  NEUTROABS 14.6*  HGB 11.9*  HCT 36.0  MCV 91.4  PLT 224    Recent Results (from the past 240 hour(s))  CULTURE, BLOOD (ROUTINE X 2)     Status: None   Collection Time    07/18/13  6:00 PM      Result Value Ref Range Status   Specimen Description BLOOD RIGHT FA   Final   Special Requests BOTTLES DRAWN AEROBIC AND ANAEROBIC [facility] EACH   Final   Culture  Setup Time     Final   Value: 07/18/2013 22:40     Performed at [facility]    Culture     Final   Value:        BLOOD CULTURE RECEIVED NO GROWTH TO DATE CULTURE WILL BE HELD FOR 5 DAYS BEFORE ISSUING A FINAL NEGATIVE REPORT     Performed at [facility]   Report Status PENDING   Incomplete  MRSA PCR SCREENING     Status: None   Collection Time    07/19/13 12:44 AM      Result Value Ref Range Status   MRSA by PCR NEGATIVE  NEGATIVE Final   Comment:            The GeneXpert MRSA Assay (FDA     approved for NASAL specimens     only), is one component of a     comprehensive MRSA colonization     surveillance program. It is not     intended to diagnose MRSA     infection nor to guide or     monitor treatment for     MRSA infections.     Scheduled Meds: . arformoterol  15 mcg Nebulization Q12H  . budesonide  0.5 mg Nebulization BID  . cloNIDine  0.1 mg Transdermal Weekly  . digoxin  0.0625 mg Oral Daily  . diltiazem  120 mg Oral Daily  . furosemide  80 mg Oral Daily  . guaiFENesin  15 mL Oral 4 times per day  . levofloxacin  IV  500 mg Intravenous Q48H  . LORazepam  0.25 mg Oral Daily  . pantoprazole  40 mg Oral Daily  . Rivaroxaban  10 mg Oral Q supper  . vancomycin  750 mg Intravenous Q24H   Continuous Infusions:  Dorothea Ogle, MD  [facility] Pager [phone]  If 7PM-7AM, please contact night-coverage [email or web address] Password [email or web address] 07/19/2013, 1:02 PM   LOS: 1 day

## 2013-07-19 NOTE — Progress Notes (Signed)
Patient's BP was 121/39,order is to hold Lasix this am per Dr. Izola Price.Hulda Marin RN

## 2013-07-20 ENCOUNTER — Inpatient Hospital Stay (HOSPITAL_COMMUNITY): Payer: Medicare Other

## 2013-07-20 LAB — BASIC METABOLIC PANEL
BUN: 28 mg/dL — AB (ref 6–23)
CHLORIDE: 96 meq/L (ref 96–112)
CO2: 30 mEq/L (ref 19–32)
Calcium: 8.9 mg/dL (ref 8.4–10.5)
Creatinine, Ser: 0.79 mg/dL (ref 0.50–1.10)
GFR, EST AFRICAN AMERICAN: 78 mL/min — AB (ref >90)
GFR, EST NON AFRICAN AMERICAN: 68 mL/min — AB (ref >90)
Glucose, Bld: 104 mg/dL — ABNORMAL HIGH (ref 70–99)
POTASSIUM: 3.2 meq/L — AB (ref 3.7–5.3)
SODIUM: 138 meq/L (ref 137–147)

## 2013-07-20 LAB — CBC
HEMATOCRIT: 31 % — AB (ref 36.0–46.0)
Hemoglobin: 10.3 g/dL — ABNORMAL LOW (ref 12.0–15.0)
MCH: 29.3 pg (ref 26.0–34.0)
MCHC: 33.2 g/dL (ref 30.0–36.0)
MCV: 88.1 fL (ref 78.0–100.0)
PLATELETS: 200 10*3/uL (ref 150–400)
RBC: 3.52 MIL/uL — ABNORMAL LOW (ref 3.87–5.11)
RDW: 15 % (ref 11.5–15.5)
WBC: 8 10*3/uL (ref 4.0–10.5)

## 2013-07-20 LAB — URINE CULTURE
Colony Count: NO GROWTH
Culture: NO GROWTH

## 2013-07-20 MED ORDER — HYDROCODONE-ACETAMINOPHEN 5-325 MG PO TABS: 1.0000 | ORAL_TABLET | Freq: Four times a day (QID) | ORAL | Status: AC | PRN

## 2013-07-20 MED ORDER — AZITHROMYCIN 250 MG PO TABS: 250.0000 mg | ORAL_TABLET | Freq: Every day | ORAL | Status: AC

## 2013-07-20 MED ORDER — LORAZEPAM 0.5 MG PO TABS: 0.5000 mg | ORAL_TABLET | Freq: Every day | ORAL | Status: AC

## 2013-07-20 MED ORDER — DILTIAZEM HCL ER COATED BEADS 120 MG PO CP24: 120.0000 mg | ORAL_CAPSULE | Freq: Every day | ORAL | Status: AC

## 2013-07-20 MED ORDER — DIGOXIN 0.0625 MG HALF TABLET: 0.0625 mg | ORAL_TABLET | Freq: Every day | ORAL | Status: DC

## 2013-07-20 MED ORDER — FUROSEMIDE 40 MG PO TABS: 40.0000 mg | ORAL_TABLET | Freq: Every day | ORAL | Status: AC | PRN

## 2013-07-20 MED ORDER — POTASSIUM CHLORIDE CRYS ER 20 MEQ PO TBCR
40.0000 meq | EXTENDED_RELEASE_TABLET | Freq: Once | ORAL | Status: AC
  Administered 2013-07-20: 40 meq via ORAL
  Filled 2013-07-20: qty 2

## 2013-07-20 MED ORDER — FLUCONAZOLE 100 MG PO TABS: 100.0000 mg | ORAL_TABLET | Freq: Every day | ORAL | Status: AC

## 2013-07-20 MED ORDER — DIGOXIN 125 MCG PO TABS: 62.5000 ug | ORAL_TABLET | Freq: Every day | ORAL | Status: AC

## 2013-07-20 NOTE — Progress Notes (Signed)
Pt had complaints of itching at IV site where Levaquin was infusing.  Small red streak noted to pt's arm along IV site. No redness or areas of rash noted on any other area of the body. Levaquin stopped at 2327. Pt had no other complaints at this time.

## 2013-07-20 NOTE — Progress Notes (Signed)
Physical Therapy Treatment Patient Details Name: ELLISYN TEUBERT MRN: [id number] DOB: [birth year] Today's Date: 07/20/2013    History of Present Illness 80 yo female admitted with weakness. Hx of Afib, asp pna, COPD, arthritis, Pna, pacemaker, osteomalacia. Pt lives with daughter.     PT Comments    Progressing slowly with mobility. Pt able to stand and take lateral steps with RW. Fatigues easily. Daughter present-states plan is still for home. Recommend HHPT  Follow Up Recommendations  Home health PT;Supervision/Assistance - 24 hour     Equipment Recommendations  None recommended by PT    Recommendations for Other Services       Precautions / Restrictions Precautions Precautions: Fall Restrictions Weight Bearing Restrictions: No    Mobility  Bed Mobility Overal bed mobility: Needs Assistance Bed Mobility: Supine to Sit;Sit to Supine     Supine to sit: Max assist;HOB elevated Sit to supine: Max assist;HOB elevated   General bed mobility comments: assist for legs and trunk.  Pt was able to scoot forward at eob  Transfers Overall transfer level: Needs assistance Equipment used: Rolling walker (2 wheeled) Transfers: Sit to/from Stand Sit to Stand: Max assist         General transfer comment: x2. Assist to rise, stabilize, control descent. Pt tends to pull up on walker.   Ambulation/Gait Ambulation/Gait assistance: Max assist   Assistive device: Rolling walker (2 wheeled)       General Gait Details: Lateral steps to R and L (~4-5 steps) with RW at EOB x 2 in each direction. Assist to stabilize pt and maneuver with walker. Pt fatigues easily.    Stairs            Wheelchair Mobility    Modified Rankin (Stroke Patients Only)       Balance Overall balance assessment: Needs assistance Sitting-balance support: Bilateral upper extremity supported;Feet supported Sitting balance-Leahy Scale: Fair     Standing balance support: Bilateral upper  extremity supported;During functional activity Standing balance-Leahy Scale: Poor                      Cognition Arousal/Alertness: Awake/alert Behavior During Therapy: WFL for tasks assessed/performed Overall Cognitive Status: Within Functional Limits for tasks assessed                      Exercises      General Comments        Pertinent Vitals/Pain Pt denied pain. Dyspnea with activity.     Home Living Family/patient expects to be discharged to:: Private residence Living Arrangements: Children Available Help at Discharge: Family Type of Home: House     Home Layout: One level Home Equipment: Bedside commode;Tub bench      Prior Function Level of Independence: Needs assistance          PT Goals (current goals can now be found in the care plan section) Acute Rehab PT Goals Patient Stated Goal: daughter:  get strength back Progress towards PT goals: Progressing toward goals    Frequency  Min 3X/week    PT Plan Current plan remains appropriate    Co-evaluation             End of Session Equipment Utilized During Treatment: Gait belt Activity Tolerance: Patient limited by fatigue Patient left: in bed;with call bell/phone within reach;with family/visitor present     Time: 1700-1749 PT Time Calculation (min): 21 min  Charges:  $Therapeutic Activity: 8-22 mins  G Codes:      Weston Anna, MPT Pager: [phone]

## 2013-07-20 NOTE — Discharge Instructions (Signed)

## 2013-07-20 NOTE — Care Management Note (Signed)
    Page 1 of 2   07/20/2013     12:47:41 PM CARE MANAGEMENT NOTE 07/20/2013  Patient:  Peggy Harvey, Peggy Harvey   Account Number:  [id number]  Date Initiated:  07/20/2013  Documentation initiated by:  [facility]  Subjective/Objective Assessment:   adm: Not feeling well; hx CAD, COPD,CHF, PNA,UTI, dysphagia; CAP     Action/Plan:   discharge planning: HH   Anticipated DC Date:  07/21/2013   Anticipated DC Plan:  HOME W HOME HEALTH SERVICES      DC Planning Services  CM consult      [facility] Choice  HOME HEALTH   Choice offered to / List presented to:  C-4 Adult Children   DME arranged  3-N-1  TUB BENCH      DME agency  [facility]     HH arranged  HH-1 RN  HH-2 PT  HH-3 OT  HH-4 NURSE'S AIDE  HH-5 SPEECH THERAPY      HH agency  [facility]   Status of service:  Completed, signed off Medicare Important Message given?  YES (If response is "NO", the following Medicare IM given date fields will be blank) Date Medicare IM given:  07/19/2013 Date Additional Medicare IM given:    Discharge Disposition:  HOME W HOME HEALTH SERVICES  Per UR Regulation:    If discussed at Long Length of Stay Meetings, dates discussed:    Comments:  07/20/13 12:30 CM spoke with pt and daughter, Leanne Chang [phone] in room to offer choice for [facility].  Family chooses [facility] to render HHPT/OT/RN/SLP/AIDE.  Address and contact information verified with Steward Drone.  3n1 and tub transfer bench will be delivered to room prior to discharge.  Referral for HHPT/OT/RN/SLP/AIDE called to [place] rep Debbie.  No other CM needs were communicated. Freddy Jaksch, BSN, CM [phone].

## 2013-07-20 NOTE — Procedures (Addendum)
Objective Swallowing Evaluation: Modified Barium Swallowing Study  Patient Details  Name: Peggy Harvey MRN: [id number] Date of Birth: 1913/11/21  Today's Date: 07/20/2013 Time: 4098-1191 SLP Time Calculation (min): 40 min  Past Medical History:  Past Medical History  Diagnosis Date  . Femoral artery occlusion   . Coronary artery disease   . COPD (chronic obstructive pulmonary disease)     F/B Peggy Harvey. On O2.  [name] Kitchen Dysphagia, oropharyngeal phase     pills have to be crushed and given with applesauce  . Arthritis   . Degenerative arthritis of spine   . Pneumonia     hx aspiraton pna  . Bleeding nose Dec. 2012  . H/O hiatal hernia   . GERD (gastroesophageal reflux disease)   . DDD (degenerative disc disease)   . Osteoarthritis   . Peripheral vascular disease   . ESBL (extended spectrum beta-lactamase) producing bacteria infection   . Constipation   . Gangrene     of the bowels  . Hyperlipidemia   . Osteomalacia, unspecified   . Essential hypertension, benign   . Cardiac pacemaker in situ     Implanted 08/05/07 PPM Generator - Manufacturer: [employer], Model # ZEPHYR XL SR [id number], Serial # [id number]  . PAD (peripheral artery disease)   . Protein-calorie malnutrition   . Supratherapeutic INR   . Acute and chronic respiratory failure   . Unspecified pleural effusion     Interval moderate-sized right pleural fusion  . Hypokalemia   . Atrial fibrillation     On coumadin  . CHF (congestive heart failure)     ECHO 02/15/11 EF = 55-60%, Ventricular septum showed abnormal function & dyssynergy, Moderate MV regurgitation, LA mod-severely dilated, Pulmonary systolic pressure was moderately increased. (Note mod MR and LA mod/severly enlarged, most likely cause of her R effusion)  . UTI (lower urinary tract infection)   . Pressure ulcer, buttock(707.05)    Past Surgical History:  Past Surgical History  Procedure Laterality Date  . Abdominal hysterectomy      In her 30's. Ovaries  left due to DUB  . Appendectomy    . Stomach surgery  1940's    stomach surgery due to gangrene  . Cholecystectomy    . Cataract extraction, bilateral    . Permanent pacemaker insertion  08/05/07    PPM Generator - Manufacturer: [employer], Model # ZEPHYR XL SR [id number], Serial # [id number]   HPI:    Pt admitted with AMS, weight loss, concern for aspiration present given h/o dysphagia, aspiration pnas, MD ordered swallow evaluation.  Given h/o dysphagia, slp proceeded with MBS.       Assessment / Plan / Recommendation Clinical Impression  Dysphagia Diagnosis: Moderate oral phase dysphagia;Moderate pharyngeal phase dysphagia  Clinical impression:  Moderate oropharyngeal dysphagia ongoing -  that is nearly consistent with findings from Peggy Harvey April 2010.  Question if pt has thrush as oral cavity filled with secretions and ? whitish coating?  Delayed oral transiting, piecemeal deglutition noted with premature spillage into pharynx.  Pharyngeal swallow delayed reflexively with liquids spilling to pyriform sinus prior to swallow.  + Aspiration of thin, trace amount of nectar via straw noted due to decreased laryngeal closure - cues to cough did not fully clear aspirates.  Small single sips of nectar and tsps of honey thick tolerated well.  Pharyngeal swallow is relatively strong without severe stasis however pt does not sense mild - mod residuals.  Pt initiated piecemealing effective to aid in clearing  oropharyngeal stasis but takes more time for pt.    Daughter reports giving pt pudding thick liquids at home with meals and free water between meals after oral care.  [facility] for [facility] and to maximize pt's swallow rehabiliation potential.    Given advanced age and pt's premorbid dysphagia, suspect ongoing low grade aspiration that pt is able to manage when well.    Recommend consider soft/nectar and free water betwen meals after oral care for hydration benefit.  Peggy DroneBrenda daughter  [facility] and educated thoroughly to findings.     Treatment Recommendation  Therapy as outlined in treatment plan below    Diet Recommendation Dysphagia 3 (Mechanical Soft);Nectar-thick liquid;Free water protocol after oral care   Liquid Administration via: Cup;No straw Medication Administration: Crushed with puree (PLEASE crush medications if large and not contraindicated) Supervision: Patient able to self feed;Full supervision/cueing for compensatory strategies Compensations: Multiple dry swallows after each bite/sip;Clear throat intermittently;Hard cough after swallow;Slow rate;Small sips/bites (intermittent cough/throat clear after sips) Postural Changes and/or Swallow Maneuvers: Upright 30-60 min after meal;Seated upright 90 degrees    Other  Recommendations Oral Care Recommendations:  (oral care after meals to assure oral cavity clear) Other Recommendations: Order thickener from pharmacy   Follow Up Recommendations  Home health SLP    Frequency and Duration min 2x/week  2 weeks     General Date of Onset: 07/20/13 Type of Study: Modified Barium Swallowing Study Reason for Referral: Objectively evaluate swallowing function Temperature Spikes Noted: No Respiratory Status: Room air Behavior/Cognition: Alert;Cooperative;Pleasant mood Oral Cavity - Dentition: Dentures, top Oral Motor / Sensory Function: Impaired motor;Impaired sensory Self-Feeding Abilities: Able to feed self Patient Positioning: Upright in chair Baseline Vocal Quality: Low vocal intensity Volitional Cough: Weak (strong reflexive cough, weak volitional cough) Volitional Swallow: Able to elicit Anatomy: Within functional limits Pharyngeal Secretions: Not observed secondary MBS    Reason for Referral Objectively evaluate swallowing function   Oral Phase Oral Preparation/Oral Phase Oral Phase: Impaired Oral - Honey Oral - Honey Teaspoon: Piecemeal swallowing;Lingual/palatal residue;Reduced posterior  propulsion;Weak lingual manipulation Oral - Nectar Oral - Nectar Teaspoon: Reduced posterior propulsion;Lingual/palatal residue;Piecemeal swallowing;Weak lingual manipulation Oral - Nectar Cup: Reduced posterior propulsion;Delayed oral transit;Weak lingual manipulation;Piecemeal swallowing;Lingual/palatal residue Oral - Nectar Straw: Reduced posterior propulsion;Delayed oral transit;Weak lingual manipulation;Piecemeal swallowing;Lingual/palatal residue Oral - Thin Oral - Thin Teaspoon: Reduced posterior propulsion;Delayed oral transit;Weak lingual manipulation;Piecemeal swallowing;Lingual/palatal residue Oral - Thin Cup: Reduced posterior propulsion;Delayed oral transit;Weak lingual manipulation;Piecemeal swallowing;Lingual/palatal residue Oral - Thin Straw: Not tested Oral - Solids Oral - Puree: Reduced posterior propulsion;Delayed oral transit;Weak lingual manipulation;Piecemeal swallowing;Lingual/palatal residue Oral - Regular: Reduced posterior propulsion;Delayed oral transit;Weak lingual manipulation;Piecemeal swallowing;Lingual/palatal residue   Pharyngeal Phase Pharyngeal Phase Pharyngeal Phase: Impaired Pharyngeal - Honey Pharyngeal - Honey Teaspoon: Reduced pharyngeal peristalsis;Delayed swallow initiation;Reduced epiglottic inversion;Reduced airway/laryngeal closure;Reduced laryngeal elevation;Reduced tongue base retraction;Premature spillage to valleculae Pharyngeal - Nectar Pharyngeal - Nectar Teaspoon: Delayed swallow initiation;Reduced pharyngeal peristalsis;Reduced airway/laryngeal closure;Reduced laryngeal elevation;Reduced tongue base retraction;Reduced epiglottic inversion;Premature spillage to pyriform sinuses Pharyngeal - Nectar Cup: Delayed swallow initiation;Reduced epiglottic inversion;Reduced pharyngeal peristalsis;Reduced laryngeal elevation;Premature spillage to pyriform sinuses;Reduced tongue base retraction;Penetration/Aspiration during swallow Penetration/Aspiration  details (nectar cup): Material enters airway, remains ABOVE vocal cords and not ejected out Pharyngeal - Nectar Straw: Trace aspiration;Penetration/Aspiration during swallow;Penetration/Aspiration after swallow;Reduced laryngeal elevation;Premature spillage to pyriform sinuses;Reduced pharyngeal peristalsis;Reduced epiglottic inversion;Reduced anterior laryngeal mobility;Delayed swallow initiation Penetration/Aspiration details (nectar straw): Material enters airway, passes BELOW cords without attempt by patient to eject out (silent  aspiration) Pharyngeal - Thin Pharyngeal - Thin Teaspoon: Delayed swallow initiation;Premature spillage to pyriform sinuses;Trace aspiration;Reduced airway/laryngeal closure;Reduced laryngeal elevation;Reduced tongue base retraction;Reduced epiglottic inversion;Reduced pharyngeal peristalsis;Penetration/Aspiration during swallow Penetration/Aspiration details (thin teaspoon): Material enters airway, passes BELOW cords without attempt by patient to eject out (silent aspiration) Pharyngeal - Thin Cup: Delayed swallow initiation;Premature spillage to pyriform sinuses;Reduced pharyngeal peristalsis;Reduced epiglottic inversion;Reduced laryngeal elevation;Reduced airway/laryngeal closure;Reduced tongue base retraction;Moderate aspiration;Penetration/Aspiration during swallow;Penetration/Aspiration after swallow Penetration/Aspiration details (thin cup): Material enters airway, passes BELOW cords without attempt by patient to eject out (silent aspiration) Pharyngeal - Thin Straw: Not tested Pharyngeal - Solids Pharyngeal - Puree: Delayed swallow initiation;Premature spillage to valleculae;Reduced tongue base retraction;Pharyngeal residue - valleculae Pharyngeal - Regular: Delayed swallow initiation;Premature spillage to valleculae;Reduced tongue base retraction;Pharyngeal residue - valleculae  Cervical Esophageal Phase    GO    Cervical Esophageal Phase Cervical Esophageal  Phase: Impaired Cervical Esophageal Phase - Comment Cervical Esophageal Comment: pt with appearance of inconsistently mildly slow clearance through esophagus without awareness - suspect possible motility issues - radiologist not present to confirm, esophagus clear at end of study         Donavan Burnet, MS [facility] SLP [phone]

## 2013-07-20 NOTE — Progress Notes (Signed)
Patient was stable at time of discharge. Reviewed discharge education with patient's daughter. She verbalized understanding. They left with patient's prescriptions.

## 2013-07-20 NOTE — Progress Notes (Signed)
[facility]  [facility] is providing the following services: [employer] (declined tub transfer bench)  If patient discharges after hours, please call [phone].   Renard Hamper 07/20/2013, 12:59 PM

## 2013-07-20 NOTE — Evaluation (Addendum)
Order for swallow evaluation received, given h/o dysphagia and aspiration pnas will proceed with MBS.  Spoke to [employer] and orders written.   Donavan Burnet, MS [facility] SLP [phone]

## 2013-07-20 NOTE — Discharge Summary (Signed)
Physician Discharge Summary  Peggy Harvey ZOX:[id number] DOB: 1914-02-16 DOA: 07/18/2013  PCP: Gweneth Dimitri, MD  Admit date: 07/18/2013 Discharge date: 07/20/2013  Recommendations for Outpatient Follow-up:  1. Pt will need to follow up with PCP in 2-3 weeks post discharge 2. Please obtain BMP to evaluate electrolytes and kidney function, potassium level 3. Please also check CBC to evaluate Hg and Hct levels 4. Pt discharged on Zithromax for PNA   Discharge Diagnoses:  Active Problems:   HYPERTENSION   Atrial fibrillation   COPD clinical dx    Dysphagia   CHF (congestive heart failure)   Coronary artery disease   CAP (community acquired pneumonia)   Sepsis   Protein-calorie malnutrition, severe  Discharge Condition: Stable  Diet recommendation: Soft diet as pt able to tolerate   History of present illness:  80 y.o. female with CAD, COPD on 3 L oxygen at home, PVD, HTN, HLD, atrial fibrillation on Xarelto, presented to [facility] ED with main concern of several days duration of progressively worsening shortness of breath, exertional and at rest, associated with generalized weakness, subjective fevers, chills. CXR worrisome for PNA and [facility] asked to admit for further evaluation.   Active Problems:  Acute hypoxic respiratory failure  - secondary to PNA, unclear causative agent, imposed on chronic COPD  - will continue treatment with ABX but will stop Vancomycin and continue Zithromax upon discharge  - follow up on sputum analysis, urine legionella and strep pneumo which are all negative to date  Leukocytosis  - from PNA  - continue Zithromax upon discharge  HYPERTENSION  - reasonable inpatient control  - continue Clonidine, Cardizem  Atrial fibrillation  - rate controlled  - continue Xarelto, digoxin, Cardizem  COPD  - clinically compensated  - continue oxygen BD's  Dysphagia  - pt requesting regular diet, allow feeding as pt able to tolerate  CHF (congestive heart failure)  -  last 2 D ECHO with diastolic grade I dysfunction, normal EF 55 %  - clinically compensated  Protein-calorie malnutrition, severe  - advance diet as pt able to tolerate   Consultants:  None  Procedures/Studies:  Dg Chest 2 View 07/18/2013 New airspace disease in the right midlung, most likely pneumonia. Followup until clearing is suggested to exclude mass lesion. There is COPD and scarring. Right pleural effusion/pleural scarring.  Antibiotics:  Vancomycin 5/24 --> 5/25  Zithromax upon discharge   Code Status: Partial code  Family Communication: Pt and daughter at bedside    Discharge Exam: Filed Vitals:   07/20/13 1445  BP: 126/41  Pulse: 65  Temp: 98.1 F (36.7 C)  Resp: 19   Filed Vitals:   07/20/13 0154 07/20/13 0625 07/20/13 0737 07/20/13 1445  BP:  116/42  126/41  Pulse:  69  65  Temp:  98 F (36.7 C)  98.1 F (36.7 C)  TempSrc:  Oral  Oral  Resp:  20  19  Height:      Weight: 48.9 kg (107 lb 12.9 oz)     SpO2:  100% 99% 100%    General: Pt is alert, follows commands appropriately, not in acute distress Cardiovascular: Regular rate and rhythm, S1/S2 +, no murmurs, no rubs, no gallops Respiratory: Clear to auscultation bilaterally, no wheezing, no crackles, no rhonchi Abdominal: Soft, non tender, non distended, bowel sounds +, no guarding Extremities: no edema, no cyanosis, pulses palpable bilaterally DP and PT Neuro: Grossly nonfocal  Discharge Instructions  Discharge Instructions   Diet - low sodium heart healthy  Complete by:  As directed      Increase activity slowly    Complete by:  As directed             Medication List    STOP taking these medications       food thickener Powd  Commonly known as:  THICK IT      TAKE these medications       acetaminophen 500 MG tablet  Commonly known as:  TYLENOL  Take 500 mg by mouth every 6 (six) hours as needed.     albuterol (2.5 MG/3ML) 0.083% nebulizer solution  Commonly known as:  PROVENTIL   Take 3 mLs (2.5 mg total) by nebulization every 6 (six) hours as needed for wheezing.     albuterol 108 (90 BASE) MCG/ACT inhaler  Commonly known as:  PROAIR HFA  Inhale 2 puffs into the lungs every 4 (four) hours as needed for wheezing. 2 puffs every 4 hours as needed only  if your can't catch your breath     arformoterol 15 MCG/2ML Nebu  Commonly known as:  BROVANA  Take 2 mLs (15 mcg total) by nebulization 2 (two) times daily. DX:  496, FILE WITH MCR PART B     azithromycin 250 MG tablet  Commonly known as:  ZITHROMAX  Take 1 tablet (250 mg total) by mouth daily.     budesonide 0.5 MG/2ML nebulizer solution  Commonly known as:  PULMICORT  Take 2 mLs (0.5 mg total) by nebulization 2 (two) times daily.     calcium carbonate (dosed in mg elemental calcium) 1250 MG/5ML  Take 500 mg of elemental calcium by mouth daily.     CATAPRES-TTS-1 0.1 mg/24hr patch  Generic drug:  cloNIDine  Place 1 patch onto the skin once a week. Changes on sundays     CRANBERRY EXTRACT PO  Take 1 tablet by mouth 2 (two) times daily.     digoxin 0.125 MG tablet  Commonly known as:  LANOXIN  Take 0.5 tablets (62.5 mcg total) by mouth daily.     diltiazem 120 MG 24 hr capsule  Commonly known as:  CARDIZEM CD  Take 1 capsule (120 mg total) by mouth daily.     ELDERBERRY PO  Take 1 capsule by mouth 2 (two) times daily.     esomeprazole 40 MG capsule  Commonly known as:  NEXIUM  Take 40 mg by mouth daily before breakfast.     ENSURE PLUS Liqd  Take 237 mLs by mouth 2 (two) times daily.     ESTROVEN PO  Take 1 capsule by mouth daily.     fluconazole 100 MG tablet  Commonly known as:  DIFLUCAN  Take 1 tablet (100 mg total) by mouth daily.     furosemide 40 MG tablet  Commonly known as:  LASIX  Take 1 tablet (40 mg total) by mouth daily as needed for fluid (swelling or shortness of braeth).     HYDROcodone-acetaminophen 5-325 MG per tablet  Commonly known as:  NORCO/VICODIN  Take 1 tablet  by mouth every 6 (six) hours as needed. pain     Iron 240 (27 FE) MG Tabs  Take 1 tablet by mouth daily.     levalbuterol 45 MCG/ACT inhaler  Commonly known as:  XOPENEX HFA  Inhale into the lungs every 4 (four) hours as needed for wheezing.     LORazepam 0.5 MG tablet  Commonly known as:  ATIVAN  Take 1 tablet (0.5 mg total)  by mouth daily. Takes with dinner     MACULAR VITAMIN BENEFIT PO  Take 1 tablet by mouth 2 (two) times daily.     MUCINEX 600 MG 12 hr tablet  Generic drug:  guaiFENesin  Take 600 mg by mouth 2 (two) times daily.     guaiFENesin 100 MG/5ML Soln  Commonly known as:  ROBITUSSIN  Take 5 mLs by mouth every 4 (four) hours as needed. cough     potassium chloride 20 MEQ/15ML (10%) solution  Take 10 mEq by mouth daily.     prochlorperazine 10 MG tablet  Commonly known as:  COMPAZINE  Take 10 mg by mouth daily.     rivaroxaban 10 MG Tabs tablet  Commonly known as:  XARELTO  Take 10 mg by mouth daily.     vitamin C 1000 MG tablet  Take 2,000 mg by mouth 2 (two) times daily.     Vitamin D 2000 UNITS tablet  Take 2,000 Units by mouth daily.           Follow-up Information   Schedule an appointment as soon as possible for a visit with [facility], MD.   Specialty:  Family Medicine   Contact information:   [address] [place] [address] [phone]       Follow up with Debbora Presto, MD. (As needed, If symptoms worsen, call my cell phone [phone])    Specialty:  Internal Medicine   Contact information:   [address] [place] [address] [phone]       Follow up with [facility]. (home health Physical and Occupational therapy, nurse, speech therapy, aide)    Contact information:   [address] [place] [address] [phone]       Follow up with [facility]. (3n1 (commode) and tub transfer bench)    Contact information:   [address] [place]  [address] [phone]        The results of significant diagnostics from this hospitalization (including imaging, microbiology, ancillary and laboratory) are listed below for reference.     Microbiology: Recent Results (from the past 240 hour(s))  CULTURE, BLOOD (ROUTINE X 2)     Status: None   Collection Time    07/18/13  5:15 PM      Result Value Ref Range Status   Specimen Description BLOOD RIGHT ARM   Final   Special Requests BOTTLES DRAWN AEROBIC AND ANAEROBIC 10CC EACH   Final   Culture  Setup Time     Final   Value: 07/19/2013 02:59     Performed at [facility]   Culture     Final   Value:        BLOOD CULTURE RECEIVED NO GROWTH TO DATE CULTURE WILL BE HELD FOR 5 DAYS BEFORE ISSUING A FINAL NEGATIVE REPORT     Performed at [facility]   Report Status PENDING   Incomplete  URINE CULTURE     Status: None   Collection Time    07/18/13  5:40 PM      Result Value Ref Range Status   Specimen Description URINE, CATHETERIZED   Final   Special Requests NONE   Final   Culture  Setup Time     Final   Value: 07/19/2013 08:29     Performed at [facility] Count     Final   Value: NO GROWTH  Performed at [facility]   Culture     Final   Value: NO GROWTH     Performed at [facility]   Report Status 07/20/2013 FINAL   Final  CULTURE, BLOOD (ROUTINE X 2)     Status: None   Collection Time    07/18/13  6:00 PM      Result Value Ref Range Status   Specimen Description BLOOD RIGHT FA   Final   Special Requests BOTTLES DRAWN AEROBIC AND ANAEROBIC [facility]8CC EACH   Final   Culture  Setup Time     Final   Value: 07/18/2013 22:40     Performed at [facility]   Culture     Final   Value:        BLOOD CULTURE RECEIVED NO GROWTH TO DATE CULTURE WILL BE HELD FOR 5 DAYS BEFORE ISSUING A FINAL NEGATIVE REPORT     Performed at [facility]   Report Status PENDING   Incomplete  MRSA PCR SCREENING     Status: None    Collection Time    07/19/13 12:44 AM      Result Value Ref Range Status   MRSA by PCR NEGATIVE  NEGATIVE Final   Comment:            The GeneXpert MRSA Assay (FDA     approved for NASAL specimens     only), is one component of a     comprehensive MRSA colonization     surveillance program. It is not     intended to diagnose MRSA     infection nor to guide or     monitor treatment for     MRSA infections.  CULTURE, EXPECTORATED SPUTUM-ASSESSMENT     Status: None   Collection Time    07/19/13  1:53 PM      Result Value Ref Range Status   Specimen Description SPUTUM   Final   Special Requests Normal   Final   Sputum evaluation     Final   Value: THIS SPECIMEN IS ACCEPTABLE. RESPIRATORY CULTURE REPORT TO FOLLOW.   Report Status 07/19/2013 FINAL   Final  CULTURE, RESPIRATORY (NON-EXPECTORATED)     Status: None   Collection Time    07/19/13  1:53 PM      Result Value Ref Range Status   Specimen Description SPUTUM   Final   Special Requests NONE   Final   Gram Stain     Final   Value: FEW WBC PRESENT, PREDOMINANTLY PMN     RARE SQUAMOUS EPITHELIAL CELLS PRESENT     RARE GRAM NEGATIVE RODS     RARE YEAST     RARE GRAM POSITIVE COCCI   Culture     Final   Value: NORMAL OROPHARYNGEAL FLORA     Performed at [facility]   Report Status PENDING   Incomplete     Labs: Basic Metabolic Panel:  Recent Labs Lab 07/18/13 1716 07/19/13 0405 07/20/13 0340  NA 142 141 138  K 5.0 4.2 3.2*  CL 97 96 96  CO2 29 30 30   GLUCOSE 208* 101* 104*  BUN 28* 25* 28*  CREATININE 0.80 0.78 0.79  CALCIUM 9.5 9.7 8.9   Liver Function Tests:  Recent Labs Lab 07/18/13 1716 07/19/13 0405  AST 25 30  ALT 9 15  ALKPHOS 86 69  BILITOT 0.6 1.1  PROT 7.0 6.9  ALBUMIN 3.5 3.2*   CBC:  Recent Labs  Lab 07/18/13 1716 07/20/13 0340  WBC 16.1* 8.0  NEUTROABS 14.6*  --   HGB 11.9* 10.3*  HCT 36.0 31.0*  MCV 91.4 88.1  PLT 224 200    SIGNED: Time coordinating discharge: Over  30 minutes  Dorothea Ogle, MD  [facility] 07/20/2013, 4:01 PM Pager [phone]  If 7PM-7AM, please contact night-coverage [email or web address] Password [email or web address]

## 2013-07-20 NOTE — Evaluation (Signed)
Occupational Therapy Evaluation Patient Details Name: Silvio PateFrances J Yanik MRN: [id number] DOB: Aug 08, 1913 Today's Date: 07/20/2013    History of Present Illness 80 yo female admitted with weakness. Hx of Afib, asp pna, COPD, arthritis, Pna, pacemaker, osteomalacia. Pt lives with daughter.    Clinical Impression   Pt was admitted for the above.  At baseline, pt ambulates to bathroom with min guard and participates with adls.  She will benefit from skilled OT to increase safety and independence with adls and standing balance to support adls.  Goals in acute are overall min A for these 2 areas.      Follow Up Recommendations  Home health OT    Equipment Recommendations   (spoke to daughter about gait belt.  May need bucket for 3:1) They have a 3:1 over  commode   Recommendations for Other Services       Precautions / Restrictions Precautions Precautions: Fall Restrictions Weight Bearing Restrictions: No      Mobility Bed Mobility           Sit to supine: Max assist   General bed mobility comments: assist for legs and trunk.  Pt was able to scoot forward at eob  Transfers     Transfers: Sit to/from Stand Sit to Stand: Mod assist;Max assist         General transfer comment: mod A x2 trials, max A x 1 trial to power up and steady.  Pt with posterior lean    Balance                                            ADL Overall ADL's : Needs assistance/impaired Eating/Feeding: Total assistance (daughter was giving her spoonfuls of thickened drinks)   Grooming: Set up;Sitting   Upper Body Bathing: Minimal assitance;Sitting   Lower Body Bathing: Total assistance;Sit to/from stand   Upper Body Dressing : Minimal assistance;Sitting   Lower Body Dressing: Total assistance;Sit to/from stand   Toilet Transfer: Maximal assistance;Stand-pivot;RW   Toileting- Clothing Manipulation and Hygiene: Total assistance;Sit to/from stand         General ADL  Comments: Pt has a tendency to lean posteriorly.  Daughter does not want SNF; it is just her and her mother at home.  Pt involved with transfer, educated on guarding/ standing at first to side to help pt power up then shifting behind her to guard against falls.  Educated on using gait belt and bedpan in 3:1 if she cannot find bucket.  Also tried fraction pan in bed, but pt was uncomfortable with this.  Also educated family to put locked w/c or other stable furniture behind 3:1 to prevent pt from tipping it over with posterior lean.  Recommended that family put a waterproof mattress pad on her bed at home.       Vision                     Perception     Praxis      Pertinent Vitals/Pain No c/o pain     Hand Dominance     Extremity/Trunk Assessment Upper Extremity Assessment Upper Extremity Assessment: Generalized weakness;RUE deficits/detail RUE Deficits / Details: lifts RUE to approximiately 80       Cervical / Trunk Assessment Cervical / Trunk Assessment: Kyphotic   Communication Communication Communication: HOH   Cognition Arousal/Alertness: Awake/alert Behavior During [facility]: [facility]  for tasks assessed/performed Overall Cognitive Status: Within Functional Limits for tasks assessed                     General Comments       Exercises       Shoulder Instructions      Home Living Family/patient expects to be discharged to:: Private residence Living Arrangements: Children Available Help at Discharge: Family Type of Home: House       Home Layout: One level     Bathroom Shower/Tub: Chief Strategy Officer: Standard     Home Equipment: Bedside commode;Tub bench; pt has a rail on side of bed          Prior Functioning/Environment Level of Independence: Needs assistance -- min            OT Diagnosis: Generalized weakness   OT Problem List: Decreased strength;Decreased activity tolerance;Decreased knowledge of use of DME or  AE;Decreased knowledge of precautions;Pain;Impaired balance (sitting and/or standing)   OT Treatment/Interventions: Self-care/ADL training;DME and/or AE instruction;Patient/family education;Balance training    OT Goals(Current goals can be found in the care plan section) Acute Rehab OT Goals Patient Stated Goal: daughter:  get strength back OT Goal Formulation: With patient/family Time For Goal Achievement: 08/03/13 Potential to Achieve Goals: Good ADL Goals Pt Will Transfer to Toilet: with min assist;bedside commode;stand pivot transfer Additional ADL Goal #1: Pt will go from sit to stand with min A and maintain for 2 minutes with min A for adls.  OT Frequency: Min 2X/week   Barriers to D/C:            Co-evaluation              End of Session    Activity Tolerance: Patient limited by fatigue Patient left: in bed;with call bell/phone within reach;with family/visitor present   Time: 4287-6811 OT Time Calculation (min): 37 min Charges:  OT General Charges $OT Visit: 1 Procedure OT Evaluation $Initial OT Evaluation Tier I: 1 Procedure OT Treatments $Self Care/Home Management : 23-37 mins G-Codes:    Marica Otter 08/04/13, 1:58 PM Marica Otter, OTR/L [phone] 08-04-2013

## 2013-07-20 NOTE — Progress Notes (Signed)
Patient came to Radiology with IV saline locked.  Bandage was bloody.  Bandage removed, and attempt to flush IV was not successful.  Rayfield Citizen, RN notified and IV was removed.

## 2013-07-21 LAB — CULTURE, RESPIRATORY: CULTURE: NORMAL

## 2013-07-21 LAB — CULTURE, RESPIRATORY W GRAM STAIN

## 2013-07-24 LAB — CULTURE, BLOOD (ROUTINE X 2): Culture: NO GROWTH

## 2013-07-25 LAB — CULTURE, BLOOD (ROUTINE X 2): Culture: NO GROWTH

## 2013-08-04 ENCOUNTER — Encounter: Payer: Self-pay | Admitting: Internal Medicine

## 2013-09-08 DIAGNOSIS — I495 Sick sinus syndrome: Secondary | ICD-10-CM

## 2013-09-12 ENCOUNTER — Other Ambulatory Visit: Payer: Self-pay | Admitting: Internal Medicine

## 2013-10-09 ENCOUNTER — Encounter (HOSPITAL_BASED_OUTPATIENT_CLINIC_OR_DEPARTMENT_OTHER): Payer: Self-pay | Admitting: Emergency Medicine

## 2013-10-09 ENCOUNTER — Emergency Department (HOSPITAL_BASED_OUTPATIENT_CLINIC_OR_DEPARTMENT_OTHER)
Admission: EM | Admit: 2013-10-09 | Discharge: 2013-10-09 | Disposition: A | Discharge status: Home / Self Care | Payer: Medicare Other | Attending: Emergency Medicine | Admitting: Emergency Medicine

## 2013-10-09 DIAGNOSIS — Z7901 Long term (current) use of anticoagulants: Secondary | ICD-10-CM | POA: Diagnosis not present

## 2013-10-09 DIAGNOSIS — Z950 Presence of cardiac pacemaker: Secondary | ICD-10-CM | POA: Diagnosis not present

## 2013-10-09 DIAGNOSIS — I2510 Atherosclerotic heart disease of native coronary artery without angina pectoris: Secondary | ICD-10-CM | POA: Insufficient documentation

## 2013-10-09 DIAGNOSIS — Z79899 Other long term (current) drug therapy: Secondary | ICD-10-CM | POA: Insufficient documentation

## 2013-10-09 DIAGNOSIS — Z792 Long term (current) use of antibiotics: Secondary | ICD-10-CM | POA: Insufficient documentation

## 2013-10-09 DIAGNOSIS — Z8701 Personal history of pneumonia (recurrent): Secondary | ICD-10-CM | POA: Insufficient documentation

## 2013-10-09 DIAGNOSIS — J9610 Chronic respiratory failure, unspecified whether with hypoxia or hypercapnia: Secondary | ICD-10-CM | POA: Diagnosis not present

## 2013-10-09 DIAGNOSIS — Z87891 Personal history of nicotine dependence: Secondary | ICD-10-CM | POA: Insufficient documentation

## 2013-10-09 DIAGNOSIS — E785 Hyperlipidemia, unspecified: Secondary | ICD-10-CM | POA: Insufficient documentation

## 2013-10-09 DIAGNOSIS — M129 Arthropathy, unspecified: Secondary | ICD-10-CM | POA: Insufficient documentation

## 2013-10-09 DIAGNOSIS — J9600 Acute respiratory failure, unspecified whether with hypoxia or hypercapnia: Secondary | ICD-10-CM | POA: Insufficient documentation

## 2013-10-09 DIAGNOSIS — M839 Adult osteomalacia, unspecified: Secondary | ICD-10-CM | POA: Diagnosis not present

## 2013-10-09 DIAGNOSIS — I739 Peripheral vascular disease, unspecified: Secondary | ICD-10-CM | POA: Insufficient documentation

## 2013-10-09 DIAGNOSIS — M479 Spondylosis, unspecified: Secondary | ICD-10-CM | POA: Insufficient documentation

## 2013-10-09 DIAGNOSIS — K5900 Constipation, unspecified: Secondary | ICD-10-CM | POA: Diagnosis not present

## 2013-10-09 DIAGNOSIS — J4489 Other specified chronic obstructive pulmonary disease: Secondary | ICD-10-CM | POA: Insufficient documentation

## 2013-10-09 DIAGNOSIS — L97409 Non-pressure chronic ulcer of unspecified heel and midfoot with unspecified severity: Secondary | ICD-10-CM | POA: Diagnosis not present

## 2013-10-09 DIAGNOSIS — Z880 Allergy status to penicillin: Secondary | ICD-10-CM | POA: Insufficient documentation

## 2013-10-09 DIAGNOSIS — IMO0002 Reserved for concepts with insufficient information to code with codable children: Secondary | ICD-10-CM | POA: Diagnosis not present

## 2013-10-09 DIAGNOSIS — Z9981 Dependence on supplemental oxygen: Secondary | ICD-10-CM | POA: Diagnosis not present

## 2013-10-09 DIAGNOSIS — K219 Gastro-esophageal reflux disease without esophagitis: Secondary | ICD-10-CM | POA: Insufficient documentation

## 2013-10-09 DIAGNOSIS — J449 Chronic obstructive pulmonary disease, unspecified: Secondary | ICD-10-CM | POA: Insufficient documentation

## 2013-10-09 DIAGNOSIS — I509 Heart failure, unspecified: Secondary | ICD-10-CM | POA: Diagnosis not present

## 2013-10-09 DIAGNOSIS — L97519 Non-pressure chronic ulcer of other part of right foot with unspecified severity: Secondary | ICD-10-CM

## 2013-10-09 DIAGNOSIS — I10 Essential (primary) hypertension: Secondary | ICD-10-CM | POA: Diagnosis not present

## 2013-10-09 DIAGNOSIS — I4891 Unspecified atrial fibrillation: Secondary | ICD-10-CM | POA: Insufficient documentation

## 2013-10-09 DIAGNOSIS — Z87440 Personal history of urinary (tract) infections: Secondary | ICD-10-CM | POA: Diagnosis not present

## 2013-10-09 DIAGNOSIS — M79609 Pain in unspecified limb: Secondary | ICD-10-CM | POA: Diagnosis present

## 2013-10-09 MED ORDER — BACITRACIN 500 UNIT/GM EX OINT
1.0000 "application " | TOPICAL_OINTMENT | Freq: Two times a day (BID) | CUTANEOUS | Status: DC
  Administered 2013-10-09: 1 via TOPICAL
  Filled 2013-10-09: qty 0.9

## 2013-10-09 MED ORDER — CEPHALEXIN 250 MG PO CAPS
500.0000 mg | ORAL_CAPSULE | Freq: Once | ORAL | Status: AC
  Administered 2013-10-09: 500 mg via ORAL
  Filled 2013-10-09: qty 2

## 2013-10-09 MED ORDER — CEPHALEXIN 500 MG PO CAPS: 500.0000 mg | ORAL_CAPSULE | Freq: Two times a day (BID) | ORAL | Status: AC

## 2013-10-09 NOTE — Discharge Instructions (Signed)
Wash the foot daily with soap and water then placed a thin layer of bacitracin ointment over the ulcer and cover with a thin nonstick bandage. Call Peggy Harvey's podiatrist to arrange for her to be seen this week

## 2013-10-09 NOTE — ED Notes (Signed)
Patient is having right  great toe pain and swelling.

## 2013-10-09 NOTE — ED Provider Notes (Signed)
CSN: [id number]     Arrival date & time 10/09/13  1700 History   First MD Initiated Contact with Patient 10/09/13 1939     Chief Complaint  Patient presents with  . Toe Pain     (Consider location/radiation/quality/duration/timing/severity/associated sxs/prior Treatment) HPI History obtained from patient and patient's daughter who accompanies her. Complains of right great toe pain or pus in one week. Seen by her primary care physician by her podiatrist and by her hospice nurse this past week for same complaint treated with postoperative shoe and with hydrocodone with relief. As the discomfort is minimal. Pain is worse with weightbearing improved with hydrocodone. No fever. No trauma. No other associated symptoms. Past Medical History  Diagnosis Date  . Femoral artery occlusion   . Coronary artery disease   . COPD (chronic obstructive pulmonary disease)     F/B Dr. Francella Solian. On O2.  [name] Kitchen Dysphagia, oropharyngeal phase     pills have to be crushed and given with applesauce  . Arthritis   . Degenerative arthritis of spine   . Pneumonia     hx aspiraton pna  . Bleeding nose Dec. 2012  . H/O hiatal hernia   . GERD (gastroesophageal reflux disease)   . DDD (degenerative disc disease)   . Osteoarthritis   . Peripheral vascular disease   . ESBL (extended spectrum beta-lactamase) producing bacteria infection   . Constipation   . Gangrene     of the bowels  . Hyperlipidemia   . Osteomalacia, unspecified   . Essential hypertension, benign   . Cardiac pacemaker in situ     Implanted 08/05/07 PPM Generator - Manufacturer: [employer], Model # [id number] XL SR [id number], Serial # [id number]  . PAD (peripheral artery disease)   . Protein-calorie malnutrition   . Supratherapeutic INR   . Acute and chronic respiratory failure   . Unspecified pleural effusion     Interval moderate-sized right pleural fusion  . Hypokalemia   . Atrial fibrillation     On coumadin  . CHF (congestive heart failure)     ECHO  02/15/11 EF = 55-60%, Ventricular septum showed abnormal function & dyssynergy, Moderate MV regurgitation, LA mod-severely dilated, Pulmonary systolic pressure was moderately increased. (Note mod MR and LA mod/severly enlarged, most likely cause of her R effusion)  . UTI (lower urinary tract infection)   . Pressure ulcer, buttock(707.05)    Past Surgical History  Procedure Laterality Date  . Abdominal hysterectomy      In her 30's. Ovaries left due to DUB  . Appendectomy    . Stomach surgery  1940's    stomach surgery due to gangrene  . Cholecystectomy    . Cataract extraction, bilateral    . Permanent pacemaker insertion  08/05/07    PPM Generator - Manufacturer: [employer], Model # [id number] XL SR [id number], Serial # [id number]   Family History  Problem Relation Age of Onset  . CAD Mother   . CAD Father   . CAD Sister   . Cancer Sister     breast  . Hodgkin's lymphoma Brother   . Cancer Brother     lung  . Allergies    . Asthma     History  Substance Use Topics  . Smoking status: Former Smoker -- 0.50 packs/day for 50 years    Types: Cigarettes    Quit date: 02/13/1989  . Smokeless tobacco: Never Used  . Alcohol Use: No   OB History   Grav Para  Term Preterm Abortions TAB SAB Ect Mult Living                 Review of Systems  Constitutional: Negative.   Skin:       Redness of great toes bilaterally      Allergies  Penicillin g sodium; Sulfonamide derivatives; and Penicillins  Home Medications   Prior to Admission medications   Medication Sig Start Date End Date Taking? Authorizing Provider  acetaminophen (TYLENOL) 500 MG tablet Take 500 mg by mouth every 6 (six) hours as needed.    Historical Provider, MD  albuterol (PROAIR HFA) 108 (90 BASE) MCG/ACT inhaler Inhale 2 puffs into the lungs every 4 (four) hours as needed for wheezing. 2 puffs every 4 hours as needed only  if your can't catch your breath 12/07/12   Nyoka Cowden, MD  albuterol (PROVENTIL) (2.5 MG/3ML) 0.083%  nebulizer solution Take 3 mLs (2.5 mg total) by nebulization every 6 (six) hours as needed for wheezing. 12/07/12   Nyoka Cowden, MD  arformoterol (BROVANA) 15 MCG/2ML NEBU Take 2 mLs (15 mcg total) by nebulization 2 (two) times daily. DX:  496, FILE WITH MCR PART B 03/03/11   Nyoka Cowden, MD  Ascorbic Acid (VITAMIN C) 1000 MG tablet Take 2,000 mg by mouth 2 (two) times daily.     Historical Provider, MD  azithromycin (ZITHROMAX) 250 MG tablet Take 1 tablet (250 mg total) by mouth daily. 07/20/13   Dorothea Ogle, MD  budesonide (PULMICORT) 0.5 MG/2ML nebulizer solution Take 2 mLs (0.5 mg total) by nebulization 2 (two) times daily. 04/25/13   Nyoka Cowden, MD  calcium carbonate, dosed in mg elemental calcium, 1250 MG/5ML Take 500 mg of elemental calcium by mouth daily.    Historical Provider, MD  Cholecalciferol (VITAMIN D) 2000 UNITS tablet Take 2,000 Units by mouth daily.      Historical Provider, MD  cloNIDine (CATAPRES-TTS-1) 0.1 mg/24hr patch Place 1 patch onto the skin once a week. Changes on sundays    Historical Provider, MD  CRANBERRY EXTRACT PO Take 1 tablet by mouth 2 (two) times daily.      Historical Provider, MD  digoxin (LANOXIN) 0.125 MG tablet Take 0.5 tablets (62.5 mcg total) by mouth daily. 07/20/13   Dorothea Ogle, MD  diltiazem (CARDIZEM CD) 120 MG 24 hr capsule Take 1 capsule (120 mg total) by mouth daily. 07/20/13   Dorothea Ogle, MD  ELDERBERRY PO Take 1 capsule by mouth 2 (two) times daily.    Historical Provider, MD  ENSURE PLUS (ENSURE PLUS) LIQD Take 237 mLs by mouth 2 (two) times daily.    Historical Provider, MD  esomeprazole (NEXIUM) 40 MG capsule Take 40 mg by mouth daily before breakfast.      Historical Provider, MD  Ferrous Gluconate (IRON) 240 (27 FE) MG TABS Take 1 tablet by mouth daily.    Historical Provider, MD  fluconazole (DIFLUCAN) 100 MG tablet Take 1 tablet (100 mg total) by mouth daily. 07/20/13   Dorothea Ogle, MD  furosemide (LASIX) 40 MG tablet Take 1  tablet (40 mg total) by mouth daily as needed for fluid (swelling or shortness of braeth). 07/20/13   Dorothea Ogle, MD  guaiFENesin (MUCINEX) 600 MG 12 hr tablet Take 600 mg by mouth 2 (two) times daily.      Historical Provider, MD  guaiFENesin (ROBITUSSIN) 100 MG/5ML SOLN Take 5 mLs by mouth every 4 (four) hours as needed. cough  Historical Provider, MD  HYDROcodone-acetaminophen (NORCO/VICODIN) 5-325 MG per tablet Take 1 tablet by mouth every 6 (six) hours as needed. pain 07/20/13   Dorothea OgleIskra M Myers, MD  levalbuterol [facility](XOPENEX HFA) 45 MCG/ACT inhaler Inhale into the lungs every 4 (four) hours as needed for wheezing.    Historical Provider, MD  LORazepam (ATIVAN) 0.5 MG tablet Take 1 tablet (0.5 mg total) by mouth daily. Takes with dinner 07/20/13   Dorothea OgleIskra M Myers, MD  Multiple Vitamins-Minerals (MACULAR VITAMIN BENEFIT PO) Take 1 tablet by mouth 2 (two) times daily.     Historical Provider, MD  Nutritional Supplements (ESTROVEN PO) Take 1 capsule by mouth daily.     Historical Provider, MD  potassium chloride 20 MEQ/15ML (10%) solution Take 10 mEq by mouth daily.     Historical Provider, MD  prochlorperazine (COMPAZINE) 10 MG tablet Take 10 mg by mouth daily.     Historical Provider, MD  rivaroxaban (XARELTO) 10 MG TABS tablet Take 10 mg by mouth daily.    Historical Provider, MD   BP 141/70  Pulse 64  Temp(Src) 98.5 F (36.9 C) (Oral)  Resp 18  Ht 5' 1.5" (1.562 m)  Wt 100 lb (45.36 kg)  BMI 18.59 kg/m2  SpO2 100% Physical Exam  Nursing note and vitals reviewed. Constitutional:  Frail chronically ill-appearing  HENT:  Head: Normocephalic and atraumatic.  Eyes: Conjunctivae are normal. Pupils are equal, round, and reactive to light.  Neck: Neck supple. No tracheal deviation present. No thyromegaly present.  Cardiovascular: Normal rate.   Pulmonary/Chest: Effort normal and breath sounds normal.  Abdominal: Soft. Bowel sounds are normal. She exhibits no distension. There is no tenderness.   Musculoskeletal: Normal range of motion. She exhibits no edema and no tenderness.  Neurological: She is alert. Coordination normal.  Skin: Skin is warm and dry. No rash noted.  Right lower extremity great toe mildly reddened, mildly tender. No redness proximal to toe There is a 2 mm ulcer at the medial aspect of the great toe. Slight amount of yellowish drainage at ulcer. Left lower shotty minimally reddened at the great toe. Nontender.  Psychiatric: She has a normal mood and affect.    ED Course  Procedures (including critical care time) Labs Review Labs Reviewed - No data to display  Imaging Review No results found.   EKG Interpretation None      MDM  Patient's daughter reports that her podiatrist and hospice nurse had not noted toeulcer last week. Final diagnoses:  None   plan bacitracin ointment. Prescription Keflex. She has hydrocodone for pain. I suggest podiatrist follow her for toe also this week Diagnosis foot ulcer      Doug SouSam Paddy Walthall, MD 10/09/13 1958

## 2013-10-24 ENCOUNTER — Encounter: Payer: Self-pay | Admitting: Internal Medicine

## 2013-10-29 ENCOUNTER — Encounter: Payer: Self-pay | Admitting: Internal Medicine

## 2013-10-29 DIAGNOSIS — I495 Sick sinus syndrome: Secondary | ICD-10-CM

## 2013-11-01 ENCOUNTER — Telehealth: Payer: Self-pay | Admitting: *Deleted

## 2013-11-01 NOTE — Telephone Encounter (Signed)
Spoke with Steward Drone Peeden(daughter) re: TTM done 10/29/13 for c/o "heart stopping".  PPM function normal PVC's noted.

## 2013-11-01 NOTE — Telephone Encounter (Signed)
Left message for Leanne Chang (daughter) re: TTM done 10/29/13 shows normal pacemaker function with PVC's.

## 2013-11-01 NOTE — Telephone Encounter (Signed)
Follow up          Pt returning [place] call

## 2013-11-04 ENCOUNTER — Telehealth: Payer: Self-pay | Admitting: Interventional Cardiology

## 2013-11-04 NOTE — Telephone Encounter (Addendum)
Spoke with pts daughter Steward Drone and she wants to know if annual appt for October is a necessity? Her mother is 78 years old and it is hard for her to get around. Steward Drone states if Dr. Eldridge Dace thinks she needs to be seen then she will do whatever it takes to get her here for an appt. Pt is feeling okay and Steward Drone feels she is table from a cardiac standpoint.

## 2013-11-04 NOTE — Telephone Encounter (Signed)
New Prob   Pts daughter has some questions regarding upcoming follow up with Dr. Eldridge Dace. Please call.

## 2013-11-08 NOTE — Telephone Encounter (Signed)
OK to skip visit with [place] if she is still followig up with a PCP and having regular blood work.  She needs Hbg and BMet checked every 6 months.  Pease clarify that she is taking Xarelto and not Coumadin.  It is unclear from my last note.

## 2013-11-08 NOTE — Telephone Encounter (Signed)
Spoke with pts daughter Steward Drone and let her know pt will need f/u labs in Nov/Dec of 2015. She confirmed pt is taking Xarelto 10 mg daily. Most likely pt will see PCP and have labs drawn by the end of the year, if not, she will call and make an appt here.

## 2013-11-08 NOTE — Telephone Encounter (Signed)
lmtrc

## 2013-11-10 ENCOUNTER — Encounter (HOSPITAL_BASED_OUTPATIENT_CLINIC_OR_DEPARTMENT_OTHER): Payer: Self-pay | Admitting: Emergency Medicine

## 2013-11-10 ENCOUNTER — Emergency Department (HOSPITAL_BASED_OUTPATIENT_CLINIC_OR_DEPARTMENT_OTHER): Payer: Medicare Other

## 2013-11-10 ENCOUNTER — Emergency Department (HOSPITAL_BASED_OUTPATIENT_CLINIC_OR_DEPARTMENT_OTHER)
Admission: EM | Admit: 2013-11-10 | Discharge: 2013-11-10 | Disposition: A | Discharge status: Home / Self Care | Payer: Medicare Other | Attending: Emergency Medicine | Admitting: Emergency Medicine

## 2013-11-10 DIAGNOSIS — Z8679 Personal history of other diseases of the circulatory system: Secondary | ICD-10-CM | POA: Diagnosis not present

## 2013-11-10 DIAGNOSIS — J4489 Other specified chronic obstructive pulmonary disease: Secondary | ICD-10-CM | POA: Insufficient documentation

## 2013-11-10 DIAGNOSIS — R110 Nausea: Secondary | ICD-10-CM | POA: Insufficient documentation

## 2013-11-10 DIAGNOSIS — I2510 Atherosclerotic heart disease of native coronary artery without angina pectoris: Secondary | ICD-10-CM | POA: Diagnosis not present

## 2013-11-10 DIAGNOSIS — Z9889 Other specified postprocedural states: Secondary | ICD-10-CM | POA: Diagnosis not present

## 2013-11-10 DIAGNOSIS — Z79899 Other long term (current) drug therapy: Secondary | ICD-10-CM | POA: Diagnosis not present

## 2013-11-10 DIAGNOSIS — Z8639 Personal history of other endocrine, nutritional and metabolic disease: Secondary | ICD-10-CM | POA: Diagnosis not present

## 2013-11-10 DIAGNOSIS — M79609 Pain in unspecified limb: Secondary | ICD-10-CM | POA: Insufficient documentation

## 2013-11-10 DIAGNOSIS — Z87440 Personal history of urinary (tract) infections: Secondary | ICD-10-CM | POA: Diagnosis not present

## 2013-11-10 DIAGNOSIS — Z87891 Personal history of nicotine dependence: Secondary | ICD-10-CM | POA: Insufficient documentation

## 2013-11-10 DIAGNOSIS — M129 Arthropathy, unspecified: Secondary | ICD-10-CM | POA: Insufficient documentation

## 2013-11-10 DIAGNOSIS — Z8701 Personal history of pneumonia (recurrent): Secondary | ICD-10-CM | POA: Diagnosis not present

## 2013-11-10 DIAGNOSIS — I509 Heart failure, unspecified: Secondary | ICD-10-CM | POA: Diagnosis not present

## 2013-11-10 DIAGNOSIS — L97509 Non-pressure chronic ulcer of other part of unspecified foot with unspecified severity: Secondary | ICD-10-CM | POA: Diagnosis not present

## 2013-11-10 DIAGNOSIS — Z872 Personal history of diseases of the skin and subcutaneous tissue: Secondary | ICD-10-CM | POA: Diagnosis not present

## 2013-11-10 DIAGNOSIS — I10 Essential (primary) hypertension: Secondary | ICD-10-CM | POA: Diagnosis not present

## 2013-11-10 DIAGNOSIS — Z8739 Personal history of other diseases of the musculoskeletal system and connective tissue: Secondary | ICD-10-CM | POA: Insufficient documentation

## 2013-11-10 DIAGNOSIS — J449 Chronic obstructive pulmonary disease, unspecified: Secondary | ICD-10-CM | POA: Insufficient documentation

## 2013-11-10 DIAGNOSIS — Z950 Presence of cardiac pacemaker: Secondary | ICD-10-CM | POA: Diagnosis not present

## 2013-11-10 DIAGNOSIS — Z862 Personal history of diseases of the blood and blood-forming organs and certain disorders involving the immune mechanism: Secondary | ICD-10-CM | POA: Diagnosis not present

## 2013-11-10 DIAGNOSIS — L089 Local infection of the skin and subcutaneous tissue, unspecified: Secondary | ICD-10-CM | POA: Insufficient documentation

## 2013-11-10 DIAGNOSIS — K219 Gastro-esophageal reflux disease without esophagitis: Secondary | ICD-10-CM | POA: Diagnosis not present

## 2013-11-10 DIAGNOSIS — I4891 Unspecified atrial fibrillation: Secondary | ICD-10-CM | POA: Diagnosis not present

## 2013-11-10 DIAGNOSIS — Z880 Allergy status to penicillin: Secondary | ICD-10-CM | POA: Diagnosis not present

## 2013-11-10 DIAGNOSIS — Z792 Long term (current) use of antibiotics: Secondary | ICD-10-CM | POA: Diagnosis not present

## 2013-11-10 DIAGNOSIS — Z8709 Personal history of other diseases of the respiratory system: Secondary | ICD-10-CM | POA: Insufficient documentation

## 2013-11-10 DIAGNOSIS — M1990 Unspecified osteoarthritis, unspecified site: Secondary | ICD-10-CM | POA: Insufficient documentation

## 2013-11-10 MED ORDER — MUPIROCIN CALCIUM 2 % EX CREA: 1.0000 "application " | TOPICAL_CREAM | Freq: Four times a day (QID) | CUTANEOUS | Status: AC

## 2013-11-10 MED ORDER — CEPHALEXIN 500 MG PO CAPS: 500.0000 mg | ORAL_CAPSULE | Freq: Four times a day (QID) | ORAL | Status: AC

## 2013-11-10 NOTE — ED Notes (Signed)
Pt transported to xray 

## 2013-11-10 NOTE — ED Provider Notes (Signed)
Medical screening examination/treatment/procedure(s) were performed by non-physician practitioner and as supervising physician I was immediately available for consultation/collaboration.    Linwood Dibbles, MD 11/10/13 [phone]

## 2013-11-10 NOTE — Discharge Instructions (Signed)
Wound Infection °A wound infection happens when a type of germ (bacteria) starts growing in the wound. In some cases, this can cause the wound to break open. If cared for properly, the infected wound will heal from the inside to the outside. Wound infections need treatment. °CAUSES °An infection is caused by bacteria growing in the wound.  °SYMPTOMS  °· Increase in redness, swelling, or pain at the wound site. °· Increase in drainage at the wound site. °· Wound or bandage (dressing) starts to smell bad. °· Fever. °· Feeling tired or fatigued. °· Pus draining from the wound. °TREATMENT  °Your health care provider will prescribe antibiotic medicine. The wound infection should improve within 24 to 48 hours. Any redness around the wound should stop spreading and the wound should be less painful.  °HOME CARE INSTRUCTIONS  °· Only take over-the-counter or prescription medicines for pain, discomfort, or fever as directed by your health care provider. °· Take your antibiotics as directed. Finish them even if you start to feel better. °· Gently wash the area with mild soap and water 2 times a day, or as directed. Rinse off the soap. Pat the area dry with a clean towel. Do not rub the wound. This may cause bleeding. °· Follow your health care provider's instructions for how often you need to change the dressing. °· Apply ointment and a dressing to the wound as directed. °· If the dressing sticks, moisten it with soapy water and gently remove it. °· Change the bandage right away if it becomes wet, dirty, or develops a bad smell. °· Take showers. Do not take tub baths, swim, or do anything that may soak the wound until it is healed. °· Avoid exercises that make you sweat heavily. °· Use anti-itch medicine as directed by your health care provider. The wound may itch when it is healing. Do not pick or scratch at the wound. °· Follow up with your health care provider to get your wound rechecked as directed. °SEEK MEDICAL CARE  IF: °· You have an increase in swelling, pain, or redness around the wound. °· You have an increase in the amount of pus coming from the wound. °· There is a bad smell coming from the wound. °· More of the wound breaks open. °· You have a fever. °MAKE SURE YOU:  °· Understand these instructions. °· Will watch your condition. °· Will get help right away if you are not doing well or get worse. °Document Released: 11/09/2002 Document Revised: 02/15/2013 Document Reviewed: 06/16/2010 °ExitCare® Patient Information ©2015 [employer]. This information is not intended to replace advice given to you by your health care provider. Make sure you discuss any questions you have with your health care provider. ° °

## 2013-11-10 NOTE — ED Provider Notes (Signed)
CSN: [id number]     Arrival date & time 11/10/13  1727 History   First MD Initiated Contact with Patient 11/10/13 1741     Chief Complaint  Patient presents with  . Toe Pain     (Consider location/radiation/quality/duration/timing/severity/associated sxs/prior Treatment) Patient is a 80 y.o. female presenting with toe pain. The history is provided by the patient. No language interpreter was used.  Toe Pain This is a new problem. The current episode started more than 1 month ago. The problem occurs constantly. The problem has been unchanged. Associated symptoms include nausea. Nothing aggravates the symptoms. She has tried nothing for the symptoms. The treatment provided moderate relief.    Past Medical History  Diagnosis Date  . Femoral artery occlusion   . Coronary artery disease   . COPD (chronic obstructive pulmonary disease)     F/B Dr. Francella Solian. On O2.  [name] Kitchen Dysphagia, oropharyngeal phase     pills have to be crushed and given with applesauce  . Arthritis   . Degenerative arthritis of spine   . Pneumonia     hx aspiraton pna  . Bleeding nose Dec. 2012  . H/O hiatal hernia   . GERD (gastroesophageal reflux disease)   . DDD (degenerative disc disease)   . Osteoarthritis   . Peripheral vascular disease   . ESBL (extended spectrum beta-lactamase) producing bacteria infection   . Constipation   . Gangrene     of the bowels  . Hyperlipidemia   . Osteomalacia, unspecified   . Essential hypertension, benign   . Cardiac pacemaker in situ     Implanted 08/05/07 PPM Generator - Manufacturer: [employer], Model # ZEPHYR XL SR [id number], Serial # [id number]  . PAD (peripheral artery disease)   . Protein-calorie malnutrition   . Supratherapeutic INR   . Acute and chronic respiratory failure   . Unspecified pleural effusion     Interval moderate-sized right pleural fusion  . Hypokalemia   . Atrial fibrillation     On coumadin  . CHF (congestive heart failure)     ECHO 02/15/11 EF = 55-60%,  Ventricular septum showed abnormal function & dyssynergy, Moderate MV regurgitation, LA mod-severely dilated, Pulmonary systolic pressure was moderately increased. (Note mod MR and LA mod/severly enlarged, most likely cause of her R effusion)  . UTI (lower urinary tract infection)   . Pressure ulcer, buttock(707.05)    Past Surgical History  Procedure Laterality Date  . Abdominal hysterectomy      In her 30's. Ovaries left due to DUB  . Appendectomy    . Stomach surgery  1940's    stomach surgery due to gangrene  . Cholecystectomy    . Cataract extraction, bilateral    . Permanent pacemaker insertion  08/05/07    PPM Generator - Manufacturer: [employer], Model # ZEPHYR XL SR [id number], Serial # [id number]   Family History  Problem Relation Age of Onset  . CAD Mother   . CAD Father   . CAD Sister   . Cancer Sister     breast  . Hodgkin's lymphoma Brother   . Cancer Brother     lung  . Allergies    . Asthma     History  Substance Use Topics  . Smoking status: Former Smoker -- 0.50 packs/day for 50 years    Types: Cigarettes    Quit date: 02/13/1989  . Smokeless tobacco: Never Used  . Alcohol Use: No   OB History   Grav Para Term  Preterm Abortions TAB SAB Ect Mult Living                 Review of Systems  Gastrointestinal: Positive for nausea.  Skin: Positive for wound.  All other systems reviewed and are negative.     Allergies  Penicillin g sodium; Sulfonamide derivatives; and Penicillins  Home Medications   Prior to Admission medications   Medication Sig Start Date End Date Taking? Authorizing Provider  acetaminophen (TYLENOL) 500 MG tablet Take 500 mg by mouth every 6 (six) hours as needed.    Historical Provider, MD  albuterol (PROAIR HFA) 108 (90 BASE) MCG/ACT inhaler Inhale 2 puffs into the lungs every 4 (four) hours as needed for wheezing. 2 puffs every 4 hours as needed only  if your can't catch your breath 12/07/12   Nyoka Cowden, MD  albuterol (PROVENTIL) (2.5  MG/3ML) 0.083% nebulizer solution Take 3 mLs (2.5 mg total) by nebulization every 6 (six) hours as needed for wheezing. 12/07/12   Nyoka Cowden, MD  arformoterol (BROVANA) 15 MCG/2ML NEBU Take 2 mLs (15 mcg total) by nebulization 2 (two) times daily. DX:  496, FILE WITH MCR PART B 03/03/11   Nyoka Cowden, MD  Ascorbic Acid (VITAMIN C) 1000 MG tablet Take 2,000 mg by mouth 2 (two) times daily.     Historical Provider, MD  azithromycin (ZITHROMAX) 250 MG tablet Take 1 tablet (250 mg total) by mouth daily. 07/20/13   Dorothea Ogle, MD  budesonide (PULMICORT) 0.5 MG/2ML nebulizer solution Take 2 mLs (0.5 mg total) by nebulization 2 (two) times daily. 04/25/13   Nyoka Cowden, MD  calcium carbonate, dosed in mg elemental calcium, 1250 MG/5ML Take 500 mg of elemental calcium by mouth daily.    Historical Provider, MD  cephALEXin (KEFLEX) 500 MG capsule Take 1 capsule (500 mg total) by mouth 2 (two) times daily. 10/09/13   Doug Sou, MD  Cholecalciferol (VITAMIN D) 2000 UNITS tablet Take 2,000 Units by mouth daily.      Historical Provider, MD  cloNIDine (CATAPRES-TTS-1) 0.1 mg/24hr patch Place 1 patch onto the skin once a week. Changes on sundays    Historical Provider, MD  CRANBERRY EXTRACT PO Take 1 tablet by mouth 2 (two) times daily.      Historical Provider, MD  digoxin (LANOXIN) 0.125 MG tablet Take 0.5 tablets (62.5 mcg total) by mouth daily. 07/20/13   Dorothea Ogle, MD  diltiazem (CARDIZEM CD) 120 MG 24 hr capsule Take 1 capsule (120 mg total) by mouth daily. 07/20/13   Dorothea Ogle, MD  ELDERBERRY PO Take 1 capsule by mouth 2 (two) times daily.    Historical Provider, MD  ENSURE PLUS (ENSURE PLUS) LIQD Take 237 mLs by mouth 2 (two) times daily.    Historical Provider, MD  esomeprazole (NEXIUM) 40 MG capsule Take 40 mg by mouth daily before breakfast.      Historical Provider, MD  Ferrous Gluconate (IRON) 240 (27 FE) MG TABS Take 1 tablet by mouth daily.    Historical Provider, MD  fluconazole  (DIFLUCAN) 100 MG tablet Take 1 tablet (100 mg total) by mouth daily. 07/20/13   Dorothea Ogle, MD  furosemide (LASIX) 40 MG tablet Take 1 tablet (40 mg total) by mouth daily as needed for fluid (swelling or shortness of braeth). 07/20/13   Dorothea Ogle, MD  guaiFENesin (MUCINEX) 600 MG 12 hr tablet Take 600 mg by mouth 2 (two) times daily.  Historical Provider, MD  guaiFENesin (ROBITUSSIN) 100 MG/5ML SOLN Take 5 mLs by mouth every 4 (four) hours as needed. cough     Historical Provider, MD  HYDROcodone-acetaminophen (NORCO/VICODIN) 5-325 MG per tablet Take 1 tablet by mouth every 6 (six) hours as needed. pain 07/20/13   Dorothea Ogle, MD  levalbuterol [facility] HFA) 45 MCG/ACT inhaler Inhale into the lungs every 4 (four) hours as needed for wheezing.    Historical Provider, MD  LORazepam (ATIVAN) 0.5 MG tablet Take 1 tablet (0.5 mg total) by mouth daily. Takes with dinner 07/20/13   Dorothea Ogle, MD  Multiple Vitamins-Minerals (MACULAR VITAMIN BENEFIT PO) Take 1 tablet by mouth 2 (two) times daily.     Historical Provider, MD  Nutritional Supplements (ESTROVEN PO) Take 1 capsule by mouth daily.     Historical Provider, MD  potassium chloride 20 MEQ/15ML (10%) solution Take 10 mEq by mouth daily.     Historical Provider, MD  prochlorperazine (COMPAZINE) 10 MG tablet Take 10 mg by mouth daily.     Historical Provider, MD  rivaroxaban (XARELTO) 10 MG TABS tablet Take 10 mg by mouth daily.    Historical Provider, MD   BP 148/63  Pulse 66  Resp 16  SpO2 100% Physical Exam  Nursing note and vitals reviewed. Constitutional: She appears well-developed and well-nourished.  HENT:  Head: Normocephalic.  Cardiovascular: Normal rate and normal heart sounds.   Pulmonary/Chest: Effort normal.  Musculoskeletal: She exhibits tenderness.  Tender right 1st toe, healing blister between 1st and 2nd toe.  Neurological: She is alert.  Skin: Skin is warm.  Psychiatric: She has a normal mood and affect.     ED Course  Procedures (including critical care time) Labs Review Labs Reviewed - No data to display  Imaging Review Dg Toe Great Right  11/10/2013   CLINICAL DATA:  Great toe pain, severe pain with redness  EXAM: RIGHT GREAT TOE  COMPARISON:  None.  FINDINGS: There is no loss of the cortical integrity of the distal phalanx of the great toe. There may be gas under the nail bed. No gas within the soft tissues otherwise. No foreign body evident.  IMPRESSION: No evidence of osteomyelitis. Potential nail bed infection. Recommend clinical correlation.   Electronically Signed   By: Genevive Bi M.D.   On: 11/10/2013 18:44     EKG Interpretation None      MDM  Xray is normal,  Pt did not tolerate keflex well.  I will change and treat with bactroban topical   Final diagnoses:  Skin infection        Elson Areas, PA-C 11/10/13 1936

## 2013-11-10 NOTE — ED Notes (Signed)
Pt c/o right big toe pain x 1 month seen here 8/16 for same, as completed 2  Runs of kelfex

## 2013-11-13 ENCOUNTER — Other Ambulatory Visit: Payer: Self-pay | Admitting: Internal Medicine

## 2013-11-24 ENCOUNTER — Encounter: Payer: Self-pay | Admitting: Internal Medicine

## 2013-12-13 ENCOUNTER — Other Ambulatory Visit: Payer: Self-pay | Admitting: Internal Medicine

## 2014-01-12 ENCOUNTER — Telehealth: Payer: Self-pay | Admitting: Internal Medicine

## 2014-01-12 NOTE — Telephone Encounter (Signed)
Pt's dtr wants to know when pt due for next device ck and if kristin can come out to her home again

## 2014-01-13 NOTE — Telephone Encounter (Signed)
Spoke w/pt's daughter. Pt needs checking in May 2016. Informed pt to call back once letter was received.

## 2014-01-14 ENCOUNTER — Other Ambulatory Visit: Payer: Self-pay | Admitting: Internal Medicine

## 2014-01-31 ENCOUNTER — Encounter: Payer: Self-pay | Admitting: Internal Medicine

## 2014-01-31 DIAGNOSIS — R001 Bradycardia, unspecified: Secondary | ICD-10-CM

## 2014-02-22 ENCOUNTER — Other Ambulatory Visit: Payer: Self-pay | Admitting: Internal Medicine

## 2014-02-22 ENCOUNTER — Telehealth: Payer: Self-pay

## 2014-02-22 NOTE — Telephone Encounter (Signed)
Received a fax request for budesonide neb refill.  Pt has not been seen since 12/2012 with no rov scheduled.  Called pt's daughter to schedule an rov in order to refill med as she has not been seen in over a year.  Pt's daughter declined appt, states that she will see if pcp will fill med for her.  I advised her that if pcp is not willing to do this we are happy to see pt and refill this.  She is aware.    Nothing further needed at this time.

## 2014-03-20 ENCOUNTER — Other Ambulatory Visit: Payer: Self-pay | Admitting: Internal Medicine

## 2014-04-05 ENCOUNTER — Telehealth: Payer: Self-pay | Admitting: Physical Therapy

## 2014-04-05 NOTE — Telephone Encounter (Signed)
New Message        Pt's daughter calling stating that she received her mothers recall letter for Pacer Check for May 2016. Daughter states that last year Peggy Harvey came to their house to do check and states that Peggy Harvey said she would do the check the same way this way since they are unable to get pt out of the house. Please call back and advise.

## 2014-04-10 ENCOUNTER — Other Ambulatory Visit: Payer: Self-pay | Admitting: Internal Medicine

## 2014-04-14 ENCOUNTER — Telehealth: Payer: Self-pay | Admitting: Internal Medicine

## 2014-04-14 NOTE — Telephone Encounter (Signed)
Spoke w/pt's daughter---needs device check in May. Reminder on calendar.

## 2014-04-14 NOTE — Telephone Encounter (Signed)
Daughter was called 

## 2014-04-14 NOTE — Telephone Encounter (Signed)
New problem    Pt's daughter stated she was waiting on a [place] back [place] to [place]'s [place] for pacemaker check. Please call pt.

## 2014-05-01 ENCOUNTER — Telehealth: Payer: Self-pay | Admitting: Internal Medicine

## 2014-05-01 NOTE — Telephone Encounter (Signed)
New message      Calling to give an update.   Pt is transitioning to end of life.  The expectancy is within the next few days. Pacemaker has not been turned off---family is not going to do anything about turning it off----(it is not necessary)

## 2014-05-01 NOTE — Telephone Encounter (Signed)
Sent to Dr. Graciela HusbandsKlein for review.  Hospice called to keep you updated.

## 2014-05-26 DEATH — deceased
